# Patient Record
Sex: Male | Born: 1965
Health system: Southern US, Community
[De-identification: ages and names within clinical notes are randomized; demographics above are authoritative.]

## PROBLEM LIST (undated history)

## (undated) DIAGNOSIS — G8929 Other chronic pain: Secondary | ICD-10-CM

## (undated) DIAGNOSIS — T8859XA Other complications of anesthesia, initial encounter: Secondary | ICD-10-CM

## (undated) DIAGNOSIS — I249 Acute ischemic heart disease, unspecified: Secondary | ICD-10-CM

## (undated) DIAGNOSIS — M1990 Unspecified osteoarthritis, unspecified site: Secondary | ICD-10-CM

## (undated) DIAGNOSIS — I10 Essential (primary) hypertension: Secondary | ICD-10-CM

## (undated) DIAGNOSIS — T4145XA Adverse effect of unspecified anesthetic, initial encounter: Secondary | ICD-10-CM

## (undated) DIAGNOSIS — I214 Non-ST elevation (NSTEMI) myocardial infarction: Secondary | ICD-10-CM

## (undated) DIAGNOSIS — I2510 Atherosclerotic heart disease of native coronary artery without angina pectoris: Secondary | ICD-10-CM

## (undated) DIAGNOSIS — M1710 Unilateral primary osteoarthritis, unspecified knee: Secondary | ICD-10-CM

## (undated) DIAGNOSIS — M25511 Pain in right shoulder: Secondary | ICD-10-CM

## (undated) DIAGNOSIS — E039 Hypothyroidism, unspecified: Secondary | ICD-10-CM

## (undated) DIAGNOSIS — E782 Mixed hyperlipidemia: Secondary | ICD-10-CM

## (undated) DIAGNOSIS — R7303 Prediabetes: Secondary | ICD-10-CM

## (undated) DIAGNOSIS — M25521 Pain in right elbow: Secondary | ICD-10-CM

## (undated) HISTORY — DX: Prediabetes: R73.03

## (undated) HISTORY — DX: Hypothyroidism, unspecified: E03.9

## (undated) HISTORY — DX: Essential (primary) hypertension: I10

## (undated) HISTORY — PX: LAMINECTOMY: SHX219

## (undated) HISTORY — PX: LAPAROSCOPIC GASTRIC SLEEVE RESECTION: SHX5895

## (undated) HISTORY — PX: MOUTH SURGERY: SHX715

## (undated) HISTORY — DX: Other chronic pain: G89.29

## (undated) HISTORY — DX: Pain in right shoulder: M25.511

## (undated) HISTORY — DX: Unilateral primary osteoarthritis, unspecified knee: M17.10

## (undated) HISTORY — DX: Pain in right elbow: M25.521

## (undated) HISTORY — DX: Non-ST elevation (NSTEMI) myocardial infarction: I21.4

## (undated) HISTORY — DX: Acute ischemic heart disease, unspecified: I24.9

## (undated) HISTORY — DX: Mixed hyperlipidemia: E78.2

## (undated) HISTORY — PX: KNEE SURGERY: SHX244

## (undated) HISTORY — DX: Atherosclerotic heart disease of native coronary artery without angina pectoris: I25.10

---

## 2013-08-16 ENCOUNTER — Ambulatory Visit (INDEPENDENT_AMBULATORY_CARE_PROVIDER_SITE_OTHER): Payer: BC Managed Care – PPO | Admitting: Emergency Medicine

## 2013-08-16 VITALS — BP 132/80 | HR 65 | Temp 98.7°F | Resp 17 | Ht 73.0 in | Wt 339.0 lb

## 2013-08-16 DIAGNOSIS — L255 Unspecified contact dermatitis due to plants, except food: Secondary | ICD-10-CM

## 2013-08-16 MED ORDER — METHYLPREDNISOLONE ACETATE 80 MG/ML IJ SUSP
120.0000 mg | Freq: Once | INTRAMUSCULAR | Status: AC
  Administered 2013-08-16: 120 mg via INTRAMUSCULAR

## 2013-08-16 NOTE — Patient Instructions (Signed)

## 2013-08-16 NOTE — Progress Notes (Signed)
[facility] [address], [place] [id number] [phone]  Date:  08/16/2013   Name:  Brandon [name]   DOB:  Jan 10, 1966   MRN:  [id number]  PCP:  No PCP Per Patient    Chief Complaint: Rash, Dizziness and Headache   History of Present Illness:  Brandon [name] is a 48 y.o. very pleasant male patient who presents with the following:  Has a several day history of a rash behind left wear and it has not "spread" to forehead.  Is pruritic.  He believes he has shingles as he had varicella 10 years ago.  No pain. No fever or chills No cough or coryza.  No nausea or vomiting. No improvement with over the counter medications or other home remedies. Denies other complaint or health concern today.   There are no active problems to display for this patient.   Past Medical History  Diagnosis Date  . Hypertension     Past Surgical History  Procedure Laterality Date  . Laparoscopic gastric sleeve resection      History  Substance Use Topics  . Smoking status: Never Smoker   . Smokeless tobacco: Not on file  . Alcohol Use: No    Family History  Problem Relation Age of Onset  . Cancer Mother   . Heart disease Father   . Hypertension Father     No Known Allergies  Medication list has been reviewed and updated.  No current outpatient prescriptions on file prior to visit.   No current facility-administered medications on file prior to visit.    Review of Systems:  As per HPI, otherwise negative.    Physical Examination: Filed Vitals:   08/16/13 1003  BP: 132/80  Pulse: 65  Temp: 98.7 F (37.1 C)  Resp: 17   Filed Vitals:   08/16/13 1003  Height: 6\' 1"  (1.854 m)  Weight: 339 lb (153.769 kg)   Body mass index is 44.74 kg/(m^2). Ideal Body Weight: Weight in (lb) to have BMI = 25: 189.1  GEN: WDWN, NAD, Non-toxic, A & O x 3 HEENT: Atraumatic, Normocephalic. Neck supple. No masses, No LAD. Ears and Nose: No external deformity. CV: RRR, No  M/G/R. No JVD. No thrill. No extra heart sounds. PULM: CTA B, no wheezes, crackles, rhonchi. No retractions. No resp. distress. No accessory muscle use. ABD: S, NT, ND, +BS. No rebound. No HSM. EXTR: No c/c/e NEURO Normal gait.  PSYCH: Normally interactive. Conversant. Not depressed or anxious appearing.  Calm demeanor.  SKIN:  Erythematous rash characteristic of contact dermatitis behind left ear and midline forehead.  Assessment and Plan: Contact dermatitis Depo medrol Benadryl    Signed,  Phillips OdorJeffery Katesha Eichel, MD

## 2013-10-17 ENCOUNTER — Encounter (HOSPITAL_COMMUNITY): Payer: Self-pay | Admitting: Emergency Medicine

## 2013-10-17 ENCOUNTER — Emergency Department (HOSPITAL_COMMUNITY)
Admission: EM | Admit: 2013-10-17 | Discharge: 2013-10-17 | Disposition: A | Discharge status: Home / Self Care | Payer: BC Managed Care – PPO | Attending: Emergency Medicine | Admitting: Emergency Medicine

## 2013-10-17 DIAGNOSIS — Z791 Long term (current) use of non-steroidal anti-inflammatories (NSAID): Secondary | ICD-10-CM | POA: Insufficient documentation

## 2013-10-17 DIAGNOSIS — I10 Essential (primary) hypertension: Secondary | ICD-10-CM | POA: Diagnosis not present

## 2013-10-17 DIAGNOSIS — M1990 Unspecified osteoarthritis, unspecified site: Secondary | ICD-10-CM | POA: Insufficient documentation

## 2013-10-17 DIAGNOSIS — I83891 Varicose veins of right lower extremities with other complications: Secondary | ICD-10-CM | POA: Insufficient documentation

## 2013-10-17 DIAGNOSIS — M7989 Other specified soft tissue disorders: Secondary | ICD-10-CM | POA: Diagnosis present

## 2013-10-17 DIAGNOSIS — L729 Follicular cyst of the skin and subcutaneous tissue, unspecified: Secondary | ICD-10-CM | POA: Insufficient documentation

## 2013-10-17 HISTORY — DX: Unspecified osteoarthritis, unspecified site: M19.90

## 2013-10-17 NOTE — ED Notes (Signed)
Pt reports swelling in r/lower leg this am. Swelling noted in r/leg, anterior. Pt denies pain, but tender to pressure/touch. Tenderness does not radiate. Pt denies numbness or tingling in r/foot. . Raised area in 3cm x 4cm. No redness noted

## 2013-10-17 NOTE — Discharge Instructions (Signed)
Compression Stockings Compression stockings are elastic stockings that "compress" your legs. This helps to increase blood flow, decrease swelling, and reduces the chance of getting blood clots in your lower legs. Compression stockings are used:  After surgery.  If you have a history of poor circulation.  If you are prone to blood clots.  If you have varicose veins.  If you sit or are bedridden for long periods of time. WEARING COMPRESSION STOCKINGS  Your compression stockings should be worn as instructed by your caregiver.  Wearing the correct stocking size is important. Your caregiver can help measure and fit you to the correct size.  When wearing your stockings, do not allow the stockings to bunch up. This is especially important around your toes or behind your knees. Keep the stockings as smooth as possible.  Do not roll the stockings downward and leave them rolled down. This can form a restrictive band around your legs and can decrease blood flow.  The stockings should be removed once a day for 1 hour or as instructed by your caregiver. When the stockings are taken off, inspect your legs and feet. Look for:  Open sores.  Red spots.  Puffy areas (swelling).  Anything that does not seem normal. IMPORTANT INFORMATION ABOUT COMPRESSION STOCKINGS  The compression stockings should be clean, dry, and in good condition before you put them on.  Do not put lotion on your legs or feet. This makes it harder to put the stockings on.  Change your stockings immediately if they become wet or soiled.  Do not wear stockings that are ripped or torn.  You may hand-wash or put your stockings in the washing machine. Use cold or warm water with mild detergent. Do not bleach your stockings. They may be air-dried or dried in the dryer on low heat.  If you have pain or have a feeling of "pins and needles" in your feet or legs, you may be wearing stockings that are too tight. Call your caregiver  right away. SEEK IMMEDIATE MEDICAL CARE IF:   You have numbness or tingling in your lower legs that does not get better quickly after the stockings are removed.  Your toes or feet become cold and blue.  You develop open sores or have red spots on your legs that do not go away. MAKE SURE YOU:   Understand these instructions.  Will watch your condition.  Will get help right away if you are not doing well or get worse. Document Released: 10/24/2008 Document Revised: 03/21/2011 Document Reviewed: 10/24/2008 [facility] Patient Information 2015 [place] [place]. This information is not intended to replace advice given to you by your health care provider. Make sure you discuss any questions you have with your health care provider.  Epidermal Cyst An epidermal cyst is usually a small, painless lump under the skin. Cysts often occur on the face, neck, stomach, chest, or genitals. The cyst may be filled with a bad smelling paste. Do not pop your cyst. Popping the cyst can cause pain and puffiness (swelling). HOME CARE   Only take medicines as told by your doctor.  Take your medicine (antibiotics) as told. Finish it even if you start to feel better. GET HELP RIGHT AWAY IF:  Your cyst is tender, red, or puffy.  You are not getting better, or you are getting worse.  You have any questions or concerns. MAKE SURE YOU:  Understand these instructions.  Will watch your condition.  Will get help right away if you are  not doing well or get worse. Document Released: 02/04/2004 Document Revised: 06/28/2011 Document Reviewed: 07/05/2010 [facility] Patient Information 2015 [place], [place]. This information is not intended to replace advice given to you by your health care provider. Make sure you discuss any questions you have with your health care provider.

## 2013-10-17 NOTE — ED Provider Notes (Signed)
CSN: [id number]     Arrival date & time 10/17/13  1107 History  This chart was scribed for non-physician practitioner working with No att. providers found by Elveria Risingimelie Horne, ED Scribe. This patient was seen in room WTR5/WTR5 and the patient's care was started at 12:10 PM.   Chief Complaint  Patient presents with  . Leg Swelling    pt noted swollen area ro r/low leg this am   The history is provided by the patient. No language interpreter was used.   HPI Comments: Brandon [name] is a 48 y.o. male who presents to the Emergency Department complaining of lump with associated redness on his right leg that he noticed upon wakening this morning. Lump located on anterolateral aspect of right leg just above his ankle. Patient denies pain with activity, but reports pain with applied pressure. Patient shares that he spends much of his work day on his feet. He denies increased standing yesterday, direct injury or trauma. Patient reports history of tendonitis ten years ago, other issues associated with his right leg, and baseline pain. Patient denies history of DVTs/PEs. Patient denies use of compression socks.   Past Medical History  Diagnosis Date  . Hypertension   . Arthritis    Past Surgical History  Procedure Laterality Date  . Laparoscopic gastric sleeve resection    . Mouth surgery    . Knee surgery      l/knee   Family History  Problem Relation Age of Onset  . Cancer Mother   . Heart disease Father   . Hypertension Father    History  Substance Use Topics  . Smoking status: Never Smoker   . Smokeless tobacco: Not on file  . Alcohol Use: Yes     Comment: occ    Review of Systems  Constitutional: Negative for fever and chills.  Cardiovascular: Negative for leg swelling.  Musculoskeletal: Negative for joint swelling.  Skin:       Lump  Neurological: Negative for weakness and numbness.   Allergies  Review of patient's allergies indicates no known allergies.  Home Medications    Prior to Admission medications   Medication Sig Start Date End Date Taking? Authorizing Provider  meloxicam (MOBIC) 15 MG tablet Take 15 mg by mouth daily.    Historical Provider, MD   Triage Vitals: BP 130/80  Pulse 78  Temp(Src) 98.6 F (37 C) (Oral)  Resp 16  SpO2 97%  Physical Exam  Nursing note and vitals reviewed. Constitutional: He is oriented to person, place, and time. He appears well-developed and well-nourished. No distress.  HENT:  Head: Normocephalic and atraumatic.  Eyes: EOM are normal.  Neck: Neck supple.  Cardiovascular: Normal rate.   Pulses:      Dorsalis pedis pulses are 2+ on the right side.  Pulmonary/Chest: Effort normal. No respiratory distress.  Musculoskeletal: Normal range of motion. He exhibits no edema and no tenderness.  Full ROM of right knee and ankle. No deformity or edema.   Neurological: He is alert and oriented to person, place, and time.  Skin: Skin is warm and dry. No erythema.  Right lower leg anterior aspect. 3x4 cm area of mobile mass consistent with fluid filled cyst, no erythema or warmth, nontender. Right lower leg varicose veins present.  No evidence of underlying infection or abscess  Psychiatric: He has a normal mood and affect. His behavior is normal.    ED Course  Procedures (including critical care time)  COORDINATION OF CARE: 12:17 PM- Plans to  consult Dr. Anitra Lauth. Discussed treatment plan with patient at bedside and patient agreed to plan.   Labs Review Labs Reviewed - No data to display  Imaging Review No results found.   EKG Interpretation None      MDM   Final diagnoses:  Skin cyst  Varicose veins of leg with swelling, right   Pt presenting to ED with soft, mobile mass on right lower legs. No evidence of underlying infection or abscess. Varicose veins present. Right foot is neurovascularly in tact. discussed pt with Dr. Anitra Lauth, will tx with compression stockings. Encouraged pt to elevate feet  throughout the day. Advised to f/u with PCP for recheck and treatment of symptoms if not improving. Return precautions provided. Pt verbalized understanding and agreement with tx plan.  I personally performed the services described in this documentation, which was scribed in my presence. The recorded information has been reviewed and is accurate.    Junius Finner, PA-C 10/17/13 1332

## 2013-10-18 NOTE — ED Provider Notes (Signed)
Medical screening examination/treatment/procedure(s) were performed by non-physician practitioner and as supervising physician I was immediately available for consultation/collaboration.   EKG Interpretation None        Melchizedek Espinola, MD 10/18/13 1315 

## 2014-04-24 ENCOUNTER — Encounter (HOSPITAL_BASED_OUTPATIENT_CLINIC_OR_DEPARTMENT_OTHER): Admission: RE | Payer: Self-pay | Source: Ambulatory Visit

## 2014-04-24 ENCOUNTER — Ambulatory Visit (HOSPITAL_BASED_OUTPATIENT_CLINIC_OR_DEPARTMENT_OTHER): Admission: RE | Admit: 2014-04-24 | Payer: Self-pay | Source: Ambulatory Visit | Admitting: Orthopedic Surgery

## 2014-04-24 SURGERY — EXCISION HAGLUND'S DEFORMITY WITH ACHILLES TENDON REPAIR
Anesthesia: General | Laterality: Right

## 2014-05-19 ENCOUNTER — Encounter (HOSPITAL_BASED_OUTPATIENT_CLINIC_OR_DEPARTMENT_OTHER): Payer: Self-pay | Admitting: *Deleted

## 2014-05-19 NOTE — Progress Notes (Addendum)
Coming tomorrow for BMET, EKG and ANESTHESIA CONSULT due to BMI 46.20. Bring all medications and pack an overnight bag. [place], had gastric sleeve surgery lost 250 lbs but has gained back about 50 lbs. and does not use CPAP machine because he no longer has machine.  Discussed pt's history with DR. Crews - definitely wants pt  To come in for Anesthesia Consult.

## 2014-05-20 ENCOUNTER — Encounter (HOSPITAL_BASED_OUTPATIENT_CLINIC_OR_DEPARTMENT_OTHER)
Admission: RE | Admit: 2014-05-20 | Discharge: 2014-05-20 | Disposition: A | Discharge status: Home / Self Care | Payer: Self-pay | Source: Ambulatory Visit | Attending: Orthopedic Surgery | Admitting: Orthopedic Surgery

## 2014-05-20 ENCOUNTER — Other Ambulatory Visit: Payer: Self-pay

## 2014-05-20 DIAGNOSIS — Z79899 Other long term (current) drug therapy: Secondary | ICD-10-CM | POA: Diagnosis not present

## 2014-05-20 DIAGNOSIS — Z9884 Bariatric surgery status: Secondary | ICD-10-CM | POA: Diagnosis not present

## 2014-05-20 DIAGNOSIS — M170 Bilateral primary osteoarthritis of knee: Secondary | ICD-10-CM | POA: Diagnosis not present

## 2014-05-20 DIAGNOSIS — M9261 Juvenile osteochondrosis of tarsus, right ankle: Secondary | ICD-10-CM | POA: Diagnosis not present

## 2014-05-20 DIAGNOSIS — G4730 Sleep apnea, unspecified: Secondary | ICD-10-CM | POA: Diagnosis not present

## 2014-05-20 DIAGNOSIS — I10 Essential (primary) hypertension: Secondary | ICD-10-CM | POA: Diagnosis not present

## 2014-05-20 DIAGNOSIS — M7661 Achilles tendinitis, right leg: Secondary | ICD-10-CM | POA: Diagnosis not present

## 2014-05-20 DIAGNOSIS — Z87891 Personal history of nicotine dependence: Secondary | ICD-10-CM | POA: Diagnosis not present

## 2014-05-20 LAB — BASIC METABOLIC PANEL
Anion gap: 9 (ref 5–15)
BUN: 13 mg/dL (ref 6–20)
CO2: 25 mmol/L (ref 22–32)
Calcium: 9.6 mg/dL (ref 8.9–10.3)
Chloride: 104 mmol/L (ref 101–111)
Creatinine, Ser: 0.82 mg/dL (ref 0.61–1.24)
GFR calc Af Amer: >60 mL/min (ref >60)
GFR calc non Af Amer: >60 mL/min (ref >60)
Glucose, Bld: 101 mg/dL — ABNORMAL HIGH (ref 70–99)
POTASSIUM: 4.5 mmol/L (ref 3.5–5.1)
SODIUM: 138 mmol/L (ref 135–145)

## 2014-05-20 NOTE — Anesthesia Preprocedure Evaluation (Addendum)
Anesthesia Evaluation  Patient identified by MRN, date of birth, ID band Patient awake    Reviewed: Allergy & Precautions, NPO status , Patient's Chart, lab work & pertinent test results  Airway Mallampati: I  TM Distance: >3 FB Neck ROM: Full    Dental no notable dental hx.    Pulmonary neg pulmonary ROS, former smoker,  breath sounds clear to auscultation  Pulmonary exam normal       Cardiovascular hypertension, Normal cardiovascular examRhythm:Regular Rate:Normal     Neuro/Psych negative neurological ROS  negative psych ROS   GI/Hepatic negative GI ROS, Neg liver ROS,   Endo/Other  Morbid obesity  Renal/GU negative Renal ROS  negative genitourinary   Musculoskeletal negative musculoskeletal ROS (+)   Abdominal   Peds negative pediatric ROS (+)  Hematology negative hematology ROS (+)   Anesthesia Other Findings 05/20/14:  By hx of pt, he was retested for sleep apnea after his weigh loss surgery and it was determined he no longer needs CPAP.  MP 1 airway. He understands he may need to stay overnight should there be a problem with his breathing. Brandon [name]  Reproductive/Obstetrics negative OB ROS                            Anesthesia Physical Anesthesia Plan  ASA: III  Anesthesia Plan: General   Post-op Pain Management:    Induction: Intravenous  Airway Management Planned: Oral ETT  Additional Equipment:   Intra-op Plan:   Post-operative Plan: Extubation in OR  Informed Consent: I have reviewed the patients History and Physical, chart, labs and discussed the procedure including the risks, benefits and alternatives for the proposed anesthesia with the patient or authorized representative who has indicated his/her understanding and acceptance.   Dental advisory given  Plan Discussed with: CRNA and Surgeon  Anesthesia Plan Comments:         Anesthesia Quick Evaluation

## 2014-05-22 ENCOUNTER — Ambulatory Visit (HOSPITAL_BASED_OUTPATIENT_CLINIC_OR_DEPARTMENT_OTHER): Payer: BLUE CROSS/BLUE SHIELD | Admitting: Anesthesiology

## 2014-05-22 ENCOUNTER — Encounter (HOSPITAL_BASED_OUTPATIENT_CLINIC_OR_DEPARTMENT_OTHER): Payer: Self-pay | Admitting: *Deleted

## 2014-05-22 ENCOUNTER — Encounter (HOSPITAL_BASED_OUTPATIENT_CLINIC_OR_DEPARTMENT_OTHER)
Admission: RE | Disposition: A | Discharge status: Home / Self Care | Payer: Self-pay | Source: Ambulatory Visit | Attending: Orthopedic Surgery

## 2014-05-22 ENCOUNTER — Ambulatory Visit (HOSPITAL_BASED_OUTPATIENT_CLINIC_OR_DEPARTMENT_OTHER)
Admission: RE | Admit: 2014-05-22 | Discharge: 2014-05-22 | Disposition: A | Discharge status: Home / Self Care | Payer: BLUE CROSS/BLUE SHIELD | Source: Ambulatory Visit | Attending: Orthopedic Surgery | Admitting: Orthopedic Surgery

## 2014-05-22 DIAGNOSIS — Z79899 Other long term (current) drug therapy: Secondary | ICD-10-CM | POA: Insufficient documentation

## 2014-05-22 DIAGNOSIS — M9261 Juvenile osteochondrosis of tarsus, right ankle: Secondary | ICD-10-CM | POA: Insufficient documentation

## 2014-05-22 DIAGNOSIS — I10 Essential (primary) hypertension: Secondary | ICD-10-CM | POA: Insufficient documentation

## 2014-05-22 DIAGNOSIS — M170 Bilateral primary osteoarthritis of knee: Secondary | ICD-10-CM | POA: Insufficient documentation

## 2014-05-22 DIAGNOSIS — G4730 Sleep apnea, unspecified: Secondary | ICD-10-CM | POA: Insufficient documentation

## 2014-05-22 DIAGNOSIS — Z9884 Bariatric surgery status: Secondary | ICD-10-CM | POA: Insufficient documentation

## 2014-05-22 DIAGNOSIS — M7661 Achilles tendinitis, right leg: Secondary | ICD-10-CM | POA: Diagnosis not present

## 2014-05-22 DIAGNOSIS — Z87891 Personal history of nicotine dependence: Secondary | ICD-10-CM | POA: Insufficient documentation

## 2014-05-22 HISTORY — PX: EXCISION HAGLUND'S DEFORMITY WITH ACHILLES TENDON REPAIR: SHX5627

## 2014-05-22 HISTORY — PX: GASTROCNEMIUS RECESSION: SHX863

## 2014-05-22 LAB — POCT HEMOGLOBIN-HEMACUE: Hemoglobin: 16.4 g/dL (ref 13.0–17.0)

## 2014-05-22 SURGERY — EXCISION HAGLUND'S DEFORMITY WITH ACHILLES TENDON REPAIR
Anesthesia: Regional | Site: Leg Lower | Laterality: Right

## 2014-05-22 MED ORDER — FENTANYL CITRATE (PF) 100 MCG/2ML IJ SOLN
INTRAMUSCULAR | Status: AC
  Filled 2014-05-22: qty 8

## 2014-05-22 MED ORDER — SUCCINYLCHOLINE CHLORIDE 20 MG/ML IJ SOLN
INTRAMUSCULAR | Status: DC | PRN
  Administered 2014-05-22: 180 mg via INTRAVENOUS

## 2014-05-22 MED ORDER — FENTANYL CITRATE (PF) 100 MCG/2ML IJ SOLN
INTRAMUSCULAR | Status: AC
  Filled 2014-05-22: qty 2

## 2014-05-22 MED ORDER — CEFAZOLIN SODIUM 1-5 GM-% IV SOLN
INTRAVENOUS | Status: AC
  Filled 2014-05-22: qty 50

## 2014-05-22 MED ORDER — MIDAZOLAM HCL 2 MG/2ML IJ SOLN
1.0000 mg | INTRAMUSCULAR | Status: DC | PRN
Start: 2014-05-22 — End: 2014-05-22
  Administered 2014-05-22: 2 mg via INTRAVENOUS

## 2014-05-22 MED ORDER — GLYCOPYRROLATE 0.2 MG/ML IJ SOLN
0.2000 mg | Freq: Once | INTRAMUSCULAR | Status: DC | PRN
Start: 2014-05-22 — End: 2014-05-22

## 2014-05-22 MED ORDER — FENTANYL CITRATE (PF) 100 MCG/2ML IJ SOLN
INTRAMUSCULAR | Status: DC | PRN
  Administered 2014-05-22: 50 ug via INTRAVENOUS

## 2014-05-22 MED ORDER — MIDAZOLAM HCL 2 MG/2ML IJ SOLN
INTRAMUSCULAR | Status: AC
  Filled 2014-05-22: qty 2

## 2014-05-22 MED ORDER — FENTANYL CITRATE (PF) 100 MCG/2ML IJ SOLN: 25.0000 ug | INTRAMUSCULAR | Status: DC | PRN

## 2014-05-22 MED ORDER — KETOROLAC TROMETHAMINE 30 MG/ML IJ SOLN: 30.0000 mg | Freq: Once | INTRAMUSCULAR | Status: DC | PRN

## 2014-05-22 MED ORDER — PROPOFOL 10 MG/ML IV BOLUS
INTRAVENOUS | Status: DC | PRN
  Administered 2014-05-22: 300 mg via INTRAVENOUS

## 2014-05-22 MED ORDER — CHLORHEXIDINE GLUCONATE 4 % EX LIQD
60.0000 mL | Freq: Once | CUTANEOUS | Status: DC
Start: 2014-05-22 — End: 2014-05-22

## 2014-05-22 MED ORDER — LACTATED RINGERS IV SOLN
INTRAVENOUS | Status: DC
  Administered 2014-05-22 (×3): via INTRAVENOUS

## 2014-05-22 MED ORDER — CEFAZOLIN SODIUM-DEXTROSE 2-3 GM-% IV SOLR
INTRAVENOUS | Status: AC
  Filled 2014-05-22: qty 50

## 2014-05-22 MED ORDER — PROPOFOL 10 MG/ML IV BOLUS
INTRAVENOUS | Status: AC
  Filled 2014-05-22: qty 20

## 2014-05-22 MED ORDER — SUCCINYLCHOLINE CHLORIDE 20 MG/ML IJ SOLN
INTRAMUSCULAR | Status: DC | PRN
Start: 2014-05-22 — End: 2014-05-22

## 2014-05-22 MED ORDER — ONDANSETRON HCL 4 MG/2ML IJ SOLN
INTRAMUSCULAR | Status: DC | PRN
  Administered 2014-05-22: 4 mg via INTRAVENOUS

## 2014-05-22 MED ORDER — SODIUM CHLORIDE 0.9 % IV SOLN: INTRAVENOUS | Status: DC

## 2014-05-22 MED ORDER — FENTANYL CITRATE (PF) 100 MCG/2ML IJ SOLN
50.0000 ug | INTRAMUSCULAR | Status: DC | PRN
  Administered 2014-05-22: 100 ug via INTRAVENOUS

## 2014-05-22 MED ORDER — MIDAZOLAM HCL 5 MG/5ML IJ SOLN
INTRAMUSCULAR | Status: DC | PRN
  Administered 2014-05-22: 2 mg via INTRAVENOUS

## 2014-05-22 MED ORDER — EPHEDRINE SULFATE 50 MG/ML IJ SOLN
INTRAMUSCULAR | Status: DC | PRN
  Administered 2014-05-22: 10 mg via INTRAVENOUS
  Administered 2014-05-22 (×2): 5 mg via INTRAVENOUS

## 2014-05-22 MED ORDER — PROMETHAZINE HCL 25 MG/ML IJ SOLN: 6.2500 mg | INTRAMUSCULAR | Status: DC | PRN

## 2014-05-22 MED ORDER — DEXAMETHASONE SODIUM PHOSPHATE 4 MG/ML IJ SOLN
INTRAMUSCULAR | Status: DC | PRN
  Administered 2014-05-22: 10 mg via INTRAVENOUS

## 2014-05-22 MED ORDER — OXYCODONE HCL 5 MG PO TABS: 5.0000 mg | ORAL_TABLET | ORAL | Status: DC | PRN

## 2014-05-22 MED ORDER — DEXTROSE 5 % IV SOLN
3.0000 g | INTRAVENOUS | Status: AC
  Administered 2014-05-22: 3 g via INTRAVENOUS

## 2014-05-22 SURGICAL SUPPLY — 79 items
BANDAGE ESMARK 6X9 LF (GAUZE/BANDAGES/DRESSINGS) ×1 IMPLANT
BLADE AVERAGE 25MMX9MM (BLADE) ×1
BLADE AVERAGE 25X9 (BLADE) ×2 IMPLANT
BLADE MICRO SAGITTAL (BLADE) IMPLANT
BLADE SURG 15 STRL LF DISP TIS (BLADE) ×2 IMPLANT
BLADE SURG 15 STRL SS (BLADE) ×4
BNDG COHESIVE 4X5 TAN STRL (GAUZE/BANDAGES/DRESSINGS) ×3 IMPLANT
BNDG COHESIVE 6X5 TAN STRL LF (GAUZE/BANDAGES/DRESSINGS) ×3 IMPLANT
BNDG ESMARK 6X9 LF (GAUZE/BANDAGES/DRESSINGS) ×3
BOOT STEPPER DURA LG (SOFTGOODS) IMPLANT
BOOT STEPPER DURA MED (SOFTGOODS) IMPLANT
CANISTER SUCT 1200ML W/VALVE (MISCELLANEOUS) IMPLANT
CHLORAPREP W/TINT 26ML (MISCELLANEOUS) ×3 IMPLANT
COVER BACK TABLE 60X90IN (DRAPES) ×3 IMPLANT
CUFF TOURNIQUET SINGLE 34IN LL (TOURNIQUET CUFF) ×3 IMPLANT
CUFF TOURNIQUET SINGLE 44IN (TOURNIQUET CUFF) ×3 IMPLANT
DRAPE EXTREMITY T 121X128X90 (DRAPE) ×3 IMPLANT
DRAPE OEC MINIVIEW 54X84 (DRAPES) ×3 IMPLANT
DRAPE U-SHAPE 47X51 STRL (DRAPES) ×3 IMPLANT
DRSG EMULSION OIL 3X3 NADH (GAUZE/BANDAGES/DRESSINGS) IMPLANT
DRSG MEPITEL 4X7.2 (GAUZE/BANDAGES/DRESSINGS) ×3 IMPLANT
DRSG PAD ABDOMINAL 8X10 ST (GAUZE/BANDAGES/DRESSINGS) ×6 IMPLANT
ELECT REM PT RETURN 9FT ADLT (ELECTROSURGICAL) ×3
ELECTRODE REM PT RTRN 9FT ADLT (ELECTROSURGICAL) ×1 IMPLANT
GAUZE SPONGE 4X4 12PLY STRL (GAUZE/BANDAGES/DRESSINGS) ×3 IMPLANT
GLOVE BIO SURGEON STRL SZ8 (GLOVE) ×3 IMPLANT
GLOVE BIOGEL PI IND STRL 8 (GLOVE) ×1 IMPLANT
GLOVE BIOGEL PI INDICATOR 8 (GLOVE) ×2
GLOVE EXAM NITRILE MD LF STRL (GLOVE) IMPLANT
GOWN STRL REUS W/ TWL LRG LVL3 (GOWN DISPOSABLE) ×1 IMPLANT
GOWN STRL REUS W/ TWL XL LVL3 (GOWN DISPOSABLE) ×1 IMPLANT
GOWN STRL REUS W/TWL LRG LVL3 (GOWN DISPOSABLE) ×2
GOWN STRL REUS W/TWL XL LVL3 (GOWN DISPOSABLE) ×2
KIT BIO-TENODESIS 3X8 DISP (MISCELLANEOUS)
KIT INSRT BABSR STRL DISP BTN (MISCELLANEOUS) IMPLANT
NDL SAFETY ECLIPSE 18X1.5 (NEEDLE) IMPLANT
NEEDLE HYPO 18GX1.5 SHARP (NEEDLE)
NEEDLE HYPO 22GX1.5 SAFETY (NEEDLE) IMPLANT
NEEDLE HYPO 25X1 1.5 SAFETY (NEEDLE) IMPLANT
NS IRRIG 1000ML POUR BTL (IV SOLUTION) ×3 IMPLANT
PACK ACHILLES SUTUREBRIDGE (Anchor) ×3 IMPLANT
PACK BASIN DAY SURGERY FS (CUSTOM PROCEDURE TRAY) ×3 IMPLANT
PAD CAST 4YDX4 CTTN HI CHSV (CAST SUPPLIES) ×1 IMPLANT
PADDING CAST ABS 4INX4YD NS (CAST SUPPLIES) ×2
PADDING CAST ABS COTTON 4X4 ST (CAST SUPPLIES) ×1 IMPLANT
PADDING CAST COTTON 4X4 STRL (CAST SUPPLIES) ×2
PADDING CAST COTTON 6X4 STRL (CAST SUPPLIES) ×3 IMPLANT
PENCIL BUTTON HOLSTER BLD 10FT (ELECTRODE) ×3 IMPLANT
SANITIZER HAND PURELL 535ML FO (MISCELLANEOUS) ×3 IMPLANT
SHEET MEDIUM DRAPE 40X70 STRL (DRAPES) ×3 IMPLANT
SLEEVE SCD COMPRESS KNEE MED (MISCELLANEOUS) ×3 IMPLANT
SPLINT FAST PLASTER 5X30 (CAST SUPPLIES) ×40
SPLINT PLASTER CAST FAST 5X30 (CAST SUPPLIES) ×20 IMPLANT
SPONGE LAP 18X18 X RAY DECT (DISPOSABLE) ×3 IMPLANT
STAPLER VISISTAT 35W (STAPLE) IMPLANT
STOCKINETTE 6  STRL (DRAPES) ×2
STOCKINETTE 6 STRL (DRAPES) ×1 IMPLANT
SUCTION FRAZIER TIP 10 FR DISP (SUCTIONS) IMPLANT
SUT ETHIBOND 2 OS 4 DA (SUTURE) IMPLANT
SUT ETHIBOND 3-0 V-5 (SUTURE) IMPLANT
SUT ETHILON 3 0 PS 1 (SUTURE) ×3 IMPLANT
SUT FIBERWIRE #2 38 T-5 BLUE (SUTURE)
SUT MNCRL AB 3-0 PS2 18 (SUTURE) ×3 IMPLANT
SUT MNCRL AB 4-0 PS2 18 (SUTURE) IMPLANT
SUT VIC AB 0 CT1 27 (SUTURE)
SUT VIC AB 0 CT1 27XBRD ANBCTR (SUTURE) IMPLANT
SUT VIC AB 0 SH 27 (SUTURE) IMPLANT
SUT VIC AB 2-0 SH 18 (SUTURE) IMPLANT
SUT VIC AB 2-0 SH 27 (SUTURE)
SUT VIC AB 2-0 SH 27XBRD (SUTURE) IMPLANT
SUT VICRYL 4-0 PS2 18IN ABS (SUTURE) IMPLANT
SUTURE FIBERWR #2 38 T-5 BLUE (SUTURE) IMPLANT
SYR BULB 3OZ (MISCELLANEOUS) ×3 IMPLANT
SYR CONTROL 10ML LL (SYRINGE) IMPLANT
TOWEL OR 17X24 6PK STRL BLUE (TOWEL DISPOSABLE) ×3 IMPLANT
TUBE CONNECTING 20'X1/4 (TUBING)
TUBE CONNECTING 20X1/4 (TUBING) IMPLANT
UNDERPAD 30X30 (UNDERPADS AND DIAPERS) ×3 IMPLANT
YANKAUER SUCT BULB TIP NO VENT (SUCTIONS) IMPLANT

## 2014-05-22 NOTE — Progress Notes (Signed)
Assisted Dr. Rose with right, ultrasound guided, popliteal block. Side rails up, monitors on throughout procedure. See vital signs in flow sheet. Tolerated Procedure well. °

## 2014-05-22 NOTE — Anesthesia Procedure Notes (Addendum)
Procedure Name: Intubation Date/Time: 05/22/2014 2:09 PM Performed by: Delta DesanctisLINKA, MARGARET L Pre-anesthesia Checklist: Patient identified, Emergency Drugs available, Suction available, Patient being monitored and Timeout performed Patient Re-evaluated:Patient Re-evaluated prior to inductionOxygen Delivery Method: Circle System Utilized Preoxygenation: Pre-oxygenation with 100% oxygen Intubation Type: IV induction Ventilation: Mask ventilation without difficulty Laryngoscope Size: Miller and 3 Grade View: Grade III Tube type: Oral Tube size: 8.0 mm Number of attempts: 1 Airway Equipment and Method: Stylet and Oral airway Placement Confirmation: ETT inserted through vocal cords under direct vision,  positive ETCO2 and breath sounds checked- equal and bilateral Secured at: 22 cm Tube secured with: Tape Dental Injury: Teeth and Oropharynx as per pre-operative assessment    Anesthesia Regional Block:  Popliteal block  Pre-Anesthetic Checklist: ,, timeout performed, Correct Patient, Correct Site, Correct Laterality, Correct Procedure, Correct Position, site marked, Risks and benefits discussed,  Surgical consent,  Pre-op evaluation,  At surgeon's request and post-op pain management  Laterality: Right  Prep: chloraprep       Needles:  Injection technique: Single-shot  Needle Type: Echogenic Stimulator Needle     Needle Length: 9cm 9 cm Needle Gauge: 21 and 21 G    Additional Needles:  Procedures: ultrasound guided (picture in chart) Popliteal block Narrative:  Injection made incrementally with aspirations every 5 mL.  Performed by: Personally   Additional Notes: Patient tolerated the procedure well without complications

## 2014-05-22 NOTE — Discharge Instructions (Addendum)
John Hewitt, MD °[facility] ° °Please read the following information regarding your care after surgery. ° °Medications  °You only need a prescription for the narcotic pain medicine (ex. oxycodone, Percocet, Norco).  All of the other medicines listed below are available over the counter. °X acetominophen (Tylenol) 650 mg every 4-6 hours as you need for minor pain °X oxycodone as prescribed for moderate to severe pain °?  ° °Narcotic pain medicine (ex. oxycodone, Percocet, Vicodin) will cause constipation.  To prevent this problem, take the following medicines while you are taking any pain medicine. °X docusate sodium (Colace) 100 mg twice a day X senna (Senokot) 2 tablets twice a day ° °X To help prevent blood clots, take an aspirin (325 mg) once a day for a month after surgery.  You should also get up every hour while you are awake to move around.   ° °Weight Bearing °? Bear weight when you are able on your operated leg or foot. °? Bear weight only on the heel of your operated foot in the post-op shoe. °X Do not bear any weight on the operated leg or foot. ° °Cast / Splint / Dressing °X Keep your splint or cast clean and dry.  Don’t put anything (coat hanger, pencil, etc) down inside of it.  If it gets damp, use a hair dryer on the cool setting to dry it.  If it gets soaked, call the office to schedule an appointment for a cast change. °? Remove your dressing 3 days after surgery and cover the incisions with dry dressings.   ° °After your dressing, cast or splint is removed; you may shower, but do not soak or scrub the wound.  Allow the water to run over it, and then gently pat it dry. ° °Swelling °It is normal for you to have swelling where you had surgery.  To reduce swelling and pain, keep your toes above your nose for at least 3 days after surgery.  It may be necessary to keep your foot or leg elevated for several weeks.  If it hurts, it should be elevated. ° °Follow Up °Call my office at  [phone] when you are discharged from the hospital or surgery center to schedule an appointment to be seen two weeks after surgery. ° °Call my office at [phone] if you develop a fever >101.5° F, nausea, vomiting, bleeding from the surgical site or severe pain.   ° ° °Post Anesthesia Home Care Instructions ° °Activity: °Get plenty of rest for the remainder of the day. A responsible adult should stay with you for 24 hours following the procedure.  °For the next 24 hours, DO NOT: °-Drive a car °-Operate machinery °-Drink alcoholic beverages °-Take any medication unless instructed by your physician °-Make any legal decisions or sign important papers. ° °Meals: °Start with liquid foods such as gelatin or soup. Progress to regular foods as tolerated. Avoid greasy, spicy, heavy foods. If nausea and/or vomiting occur, drink only clear liquids until the nausea and/or vomiting subsides. Call your physician if vomiting continues. ° °Special Instructions/Symptoms: °Your throat may feel dry or sore from the anesthesia or the breathing tube placed in your throat during surgery. If this causes discomfort, gargle with warm salt water. The discomfort should disappear within 24 hours. ° °If you had a scopolamine patch placed behind your ear for the management of post- operative nausea and/or vomiting: ° °1. The medication in the patch is effective for 72 hours, after which it should be removed.    Wrap patch in a tissue and discard in the trash. Wash hands thoroughly with soap and water. °2. You may remove the patch earlier than 72 hours if you experience unpleasant side effects which may include dry mouth, dizziness or visual disturbances. °3. Avoid touching the patch. Wash your hands with soap and water after contact with the patch. °  ° °Regional Anesthesia Blocks ° °1. Numbness or the inability to move the "blocked" extremity may last from 3-48 hours after placement. The length of time depends on the medication injected  and your individual response to the medication. If the numbness is not going away after 48 hours, call your surgeon. ° °2. The extremity that is blocked will need to be protected until the numbness is gone and the  Strength has returned. Because you cannot feel it, you will need to take extra care to avoid injury. Because it may be weak, you may have difficulty moving it or using it. You may not know what position it is in without looking at it while the block is in effect. ° °3. For blocks in the legs and feet, returning to weight bearing and walking needs to be done carefully. You will need to wait until the numbness is entirely gone and the strength has returned. You should be able to move your leg and foot normally before you try and bear weight or walk. You will need someone to be with you when you first try to ensure you do not fall and possibly risk injury. ° °4. Bruising and tenderness at the needle site are common side effects and will resolve in a few days. ° °5. Persistent numbness or new problems with movement should be communicated to the surgeon or the [facility] ([phone])/ [facility] ([phone]). ° °

## 2014-05-22 NOTE — Brief Op Note (Signed)
05/22/2014  3:24 PM  PATIENT:  Brandon [name]  49 y.o. male  PRE-OPERATIVE DIAGNOSIS:  RIGHT ACHILLES TENDONITIS, Haglund deformity and tight heelcord  POST-OPERATIVE DIAGNOSIS:  same  Procedure(s): 1.  RIGHT ACHILLES DEBRIDEMENT and reconstruction;  2.  Excision of HAGLUND deformity 3.  Right gastrocnemius recession (separate incision)   SURGEON:  Toni ArthursJohn Alphonzo Devera, MD  ASSISTANT: n/a  ANESTHESIA:   General, regional  EBL:  minimal   TOURNIQUET:  < 1 hour at 350 mm Hg  COMPLICATIONS:  None apparent  DISPOSITION:  Extubated, awake and stable to recovery.  DICTATION ID:  [id number]

## 2014-05-22 NOTE — H&P (Signed)
Brandon [name] is an 49 y.o. male.   Chief Complaint:  Right heel pain HPI: 49 y/o male with right achilles insertional tendonitis, tight heelcord and Haglund deformity.  He has failed non op treatment and presents today for surgery.  He denies any changes in his health since I saw him last.  Past Medical History  Diagnosis Date  . Hypertension   . Arthritis     both knees    Past Surgical History  Procedure Laterality Date  . Laparoscopic gastric sleeve resection    . Mouth surgery      Root canal  . Knee surgery      l/knee    Family History  Problem Relation Age of Onset  . Cancer Mother   . Heart disease Father   . Hypertension Father    Social History:  reports that he has quit smoking. He does not have any smokeless tobacco history on file. He reports that he drinks alcohol. He reports that he does not use illicit drugs.  Allergies: Not on File  Medications Prior to Admission  Medication Sig Dispense Refill  . atorvastatin (LIPITOR) 20 MG tablet Take 20 mg by mouth daily.    Marland Kitchen. LISINOPRIL PO Take 20 mg by mouth daily.     . meloxicam (MOBIC) 15 MG tablet Take 15 mg by mouth daily.      Results for orders placed or performed during the hospital encounter of 05/22/14 (from the past 48 hour(s))  Hemoglobin-hemacue, POC     Status: None   Collection Time: 05/22/14 12:15 PM  Result Value Ref Range   Hemoglobin 16.4 13.0 - 17.0 g/dL   No results found.  ROS  No recent f/c/nv/wt loss  Blood pressure 132/67, pulse 66, temperature 98 F (36.7 C), temperature source Oral, resp. rate 15, height 6' 2.02" (1.88 m), weight 165.563 kg (365 lb), SpO2 100 %. Physical Exam  wn wd male in nad.  A and o x 4.  Mood and affect normal.  EOMI.  resp unalbored.  R heel TTP at insertion of achilles.  No lymphadenopathy.  5/5 strength inPF.  Heel cord is tight.  2+ dp and pt pulses.  Feels LT normally throghout the foot.  Assessment/Plan R achilles insertional tendonitis, Haglund  deformity and tight heelcord.  To OR for gastroc recession, achilles debridement and repair and excision of Haglund deformity.  The risks and benefits of the alternative treatment options have been discussed in detail.  The patient wishes to proceed with surgery and specifically understands risks of bleeding, infection, nerve damage, blood clots, need for additional surgery, amputation and death.   Toni ArthursHEWITT, Asta Corbridge 05/22/2014, 1:43 PM

## 2014-05-22 NOTE — Transfer of Care (Signed)
Immediate Anesthesia Transfer of Care Note  Patient: Brandon PanderLyle Prioleau  Procedure(s) Performed: Procedure(s): RIGHT ACHILLES DEBRIDEMENT; HAGLUND'S EXCISION  (Right) GASTROC RECESSION  (Right)  Patient Location: PACU  Anesthesia Type:GA combined with regional for post-op pain  Level of Consciousness: awake, alert  and patient cooperative  Airway & Oxygen Therapy: Patient Spontanous Breathing and Patient connected to face mask oxygen  Post-op Assessment: Report given to RN, Post -op Vital signs reviewed and stable and Patient moving all extremities  Post vital signs: Reviewed and stable  Last Vitals:  Filed Vitals:   05/22/14 1334  BP:   Pulse: 66  Temp:   Resp: 15    Complications: No apparent anesthesia complications

## 2014-05-22 NOTE — Anesthesia Postprocedure Evaluation (Signed)
  Anesthesia Post-op Note  Patient: Brandon [name]  Procedure(s) Performed: Procedure(s) (LRB): RIGHT ACHILLES DEBRIDEMENT; HAGLUND'S EXCISION  (Right) GASTROC RECESSION  (Right)  Patient Location: PACU  Anesthesia Type: GA combined with regional for post-op pain  Level of Consciousness: awake and alert   Airway and Oxygen Therapy: Patient Spontanous Breathing  Post-op Pain: mild  Post-op Assessment: Post-op Vital signs reviewed, Patient's Cardiovascular Status Stable, Respiratory Function Stable, Patent Airway and No signs of Nausea or vomiting  Last Vitals:  Filed Vitals:   05/22/14 1625  BP: 122/70  Pulse: 70  Temp: 36.9 C  Resp: 18    Post-op Vital Signs: stable   Complications: No apparent anesthesia complications

## 2014-05-23 ENCOUNTER — Encounter (HOSPITAL_BASED_OUTPATIENT_CLINIC_OR_DEPARTMENT_OTHER): Payer: Self-pay | Admitting: Orthopedic Surgery

## 2014-05-23 NOTE — Op Note (Signed)
NAMJudithann [name]:  Brandon [name], Brandon [name]              ACCOUNT NO.:  [id number]  MEDICAL RECORD NO.:  [id number]  LOCATION:                                FACILITY:  [facility]  PHYSICIAN:  Brandon ArthursJohn Ronalee Scheunemann, MD        DATE OF BIRTH:  [birth year]  DATE OF PROCEDURE:  05/22/2014 DATE OF DISCHARGE:  05/22/2014                              OPERATIVE REPORT   PREOPERATIVE DIAGNOSES: 1. Right Achilles insertional tendinopathy. 2. Right calcaneus Haglund deformity. 3. Tight right heel cord.  POSTOPERATIVE DIAGNOSES: 1. Right Achilles insertional tendinopathy. 2. Right calcaneus Haglund deformity. 3. Tight right heel cord.  PROCEDURE: 1. Right Achilles tendon debridement and reconstruction. 2. Excision of right calcaneus Haglund deformity. 3. Right gastrocnemius recession through a separate incision.  SURGEON:  Brandon ArthursJohn Serjio Deupree, MD  ANESTHESIA:  General, regional.  ESTIMATED BLOOD LOSS:  Minimal.  TOURNIQUET TIME:  Less than 1 hour at 350 mmHg.  COMPLICATIONS:  None apparent.  DISPOSITION:  Extubated, awake, and stable to recovery.  INDICATIONS FOR PROCEDURE:  The patient is a 49 year old male with a history of right insertional Achilles tendinopathy that has been bothering him for over a year.  He has failed nonoperative treatment to date including activity modification, oral anti-inflammatories, heel lift, and extensive physical therapy.  He presents now for operative treatment of this painful condition.  He understands the risks and benefits, the alternative treatment options, and elects surgical treatment.  He specifically understands risks of bleeding, infection, nerve damage, blood clots, need for additional surgery, continued pain, amputation, and death.  PROCEDURE IN DETAIL:  After preoperative consent was obtained and the correct operative site was identified, the patient was brought to the operating room, supine on a stretcher.  General anesthesia was induced. Preoperative antibiotics were  administered.  A surgical time-out was taken.  The right lower extremity was exsanguinated and the tourniquet was inflated to 350 mmHg.  The patient was then turned into the prone position on the operating table with all bony prominences padded well. The right lower extremity was then prepped and draped in standard sterile fashion.  A longitudinal incision was made over the central portion of the calf at the gastrocnemius tendon.  Sharp dissection was carried down through skin and subcutaneous tissue.  Care was taken to protect the sural nerve and lesser saphenous vein.  Superficial fascia was incised.  The gastrocnemius tendon was identified.  It was divided in its entirety.  The wound was irrigated and the subcutaneous tissues were approximated with Monocryl.  The skin incision was closed with nylon.  Attention was then turned to the posterior aspect of the heel where longitudinal incision was made over the Achilles and extended down on to the posterior aspect of the heel.  Sharp dissection was carried down through the skin and subcutaneous tissue and paratenon.  The paratenon was elevated medially and laterally.  The Achilles tendon was then split longitudinally and released from its insertion on the calcaneus medially and laterally.  The patient's insertional enthesophyte was identified. The oscillating saw was used to resect the enthesophyte and the Haglund deformity.  Cut surface of bone was then smoothed with a rasp.  The tendon was  then debrided sharply with a #15 blade of all degenerated portion of the tendon fibers.  The remaining portion of the tendon was greater than 50% of the cross-sectional area.  The tendon was then repaired back to the cut surface of bone using the Arthrex suture bridge construct of 4-suture anchors in an hourglass pattern of suture.  The longitudinal split in the tendon was repaired with 2-0 Vicryl horizontal mattress sutures.  Wound was irrigated  copiously.  The paratenon and subcutaneous tissues were approximated over the tendon with inverted simple sutures of 3-0 Monocryl.  The skin incision was closed with horizontal mattress sutures of 3-0 nylon.  Sterile dressings were applied followed by well-padded short-leg splint.  Tourniquet was released at less than 1 hour after application of the dressings.  The patient was awakened from anesthesia and transported to the recovery room in stable condition.  FOLLOWUP PLAN:  The patient will be nonweightbearing on the right lower extremity.  He will follow up with me in 2 weeks for suture removal and conversion to a CAM walker boot with a heel lift.  In the meantime, he will take aspirin for DVT prophylaxis.     Brandon ArthursJohn Jermery Caratachea, MD     JH/MEDQ  D:  05/22/2014  T:  05/23/2014  Job:  [id number]

## 2014-06-05 NOTE — Addendum Note (Signed)
Addendum  created 06/05/14 1157 by Eilene GhaziGeorge Rudolpho Claxton, MD   Modules edited: Anesthesia Blocks and Procedures, Clinical Notes   Clinical Notes:  File: [id number]

## 2014-10-03 ENCOUNTER — Ambulatory Visit: Payer: BLUE CROSS/BLUE SHIELD | Admitting: Neurology

## 2015-02-23 ENCOUNTER — Ambulatory Visit (INDEPENDENT_AMBULATORY_CARE_PROVIDER_SITE_OTHER): Payer: BLUE CROSS/BLUE SHIELD | Admitting: Family Medicine

## 2015-02-23 VITALS — BP 132/87 | HR 76 | Temp 98.2°F | Resp 18 | Ht 73.62 in | Wt 389.4 lb

## 2015-02-23 DIAGNOSIS — R5381 Other malaise: Secondary | ICD-10-CM | POA: Diagnosis not present

## 2015-02-23 DIAGNOSIS — R5383 Other fatigue: Secondary | ICD-10-CM

## 2015-02-23 LAB — POCT CBC
Granulocyte percent: 61.3 %G (ref 37–80)
HCT, POC: 47.3 % (ref 43.5–53.7)
Hemoglobin: 16.2 g/dL (ref 14.1–18.1)
Lymph, poc: 3.7 — AB (ref 0.6–3.4)
MCH, POC: 31.6 pg — AB (ref 27–31.2)
MCHC: 34.2 g/dL (ref 31.8–35.4)
MCV: 92.4 fL (ref 80–97)
MID (cbc): 0.3 (ref 0–0.9)
MPV: 8.2 fL (ref 0–99.8)
POC Granulocyte: 6.3 (ref 2–6.9)
POC LYMPH PERCENT: 35.7 %L (ref 10–50)
POC MID %: 3 %M (ref 0–12)
Platelet Count, POC: 145 10*3/uL (ref 142–424)
RBC: 5.11 M/uL (ref 4.69–6.13)
RDW, POC: 15.5 %
WBC: 10.3 10*3/uL — AB (ref 4.6–10.2)

## 2015-02-23 LAB — COMPLETE METABOLIC PANEL WITH GFR
ALT: 44 U/L (ref 9–46)
AST: 29 U/L (ref 10–40)
Albumin: 4.7 g/dL (ref 3.6–5.1)
Alkaline Phosphatase: 52 U/L (ref 40–115)
BUN: 14 mg/dL (ref 7–25)
CO2: 29 mmol/L (ref 20–31)
Calcium: 9.9 mg/dL (ref 8.6–10.3)
Chloride: 103 mmol/L (ref 98–110)
Creat: 0.98 mg/dL (ref 0.60–1.35)
GFR, Est African American: >89 mL/min (ref >=60)
GFR, Est Non African American: >89 mL/min (ref >=60)
Glucose, Bld: 108 mg/dL — ABNORMAL HIGH (ref 65–99)
Potassium: 4.9 mmol/L (ref 3.5–5.3)
Sodium: 141 mmol/L (ref 135–146)
Total Bilirubin: 0.5 mg/dL (ref 0.2–1.2)
Total Protein: 7.4 g/dL (ref 6.1–8.1)

## 2015-02-23 NOTE — Progress Notes (Signed)
   Subjective:    Patient ID: Brandon [name], male    DOB: Sep 11, 1965, 50 y.o.   MRN: [id number] By signing my name below, I, Littie Deeds, attest that this documentation has been prepared under the direction and in the presence of Elvina Sidle, MD.  Electronically Signed: Littie Deeds, Medical Scribe. 02/23/2015. 10:49 AM.  HPI HPI Comments: Telvin Reinders is a 50 y.o. male with a history of hypertension who presents to the [facility] complaining of generalized fatigue over the past 3-4 days. Patient did have some diarrhea yesterday. He has felt like his blood pressure has been elevated. Patient denies urinary symptoms, feet swelling, cough, and congestion. He did not receive the flu shot this year. Patient currently takes meloxicam for bilateral knee pain.   Patient works for an [employer].  Review of Systems  Constitutional: Positive for fatigue.  HENT: Negative for congestion.   Respiratory: Negative for cough.   Cardiovascular: Negative for leg swelling.  Gastrointestinal: Positive for diarrhea.  Genitourinary: Negative.   Musculoskeletal: Positive for arthralgias.       Objective:   Physical Exam CONSTITUTIONAL: Well developed. Morbidly obese. HEAD: Normocephalic/atraumatic EYES: EOM/PERRL ENMT: Mucous membranes moist NECK: supple no meningeal signs SPINE: entire spine nontender CV: Regular rate and rhythm. S1/S2 noted, no murmurs/rubs/gallops noted. Repeat blood pressure: 140/80. LUNGS: Lungs are clear to auscultation bilaterally, no apparent distress ABDOMEN: soft, nontender, no rebound or guarding. No HSM. GU: no cva tenderness NEURO: Pt is awake/alert, moves all extremitiesx4 EXTREMITIES: pulses normal, full ROM SKIN: warm, color normal PSYCH: no abnormalities of mood noted     Results for orders placed or performed in visit on 02/23/15  POCT CBC  Result Value Ref Range   WBC 10.3 (A) 4.6 - 10.2 K/uL   Lymph, poc 3.7 (A) 0.6 - 3.4   POC LYMPH PERCENT 35.7 10 - 50 %L   MID (cbc) 0.3 0 - 0.9   POC MID % 3.0 0 - 12 %M   POC Granulocyte 6.3 2 - 6.9   Granulocyte percent 61.3 37 - 80 %G   RBC 5.11 4.69 - 6.13 M/uL   Hemoglobin 16.2 14.1 - 18.1 g/dL   HCT, POC 08.6 57.8 - 53.7 %   MCV 92.4 80 - 97 fL   MCH, POC 31.6 (A) 27 - 31.2 pg   MCHC 34.2 31.8 - 35.4 g/dL   RDW, POC 46.9 %   Platelet Count, POC 145 142 - 424 K/uL   MPV 8.2 0 - 99.8 fL    Assessment & Plan:   This chart was scribed in my presence and reviewed by me personally.    ICD-9-CM ICD-10-CM   1. Malaise and fatigue 780.79 R53.81 POCT CBC    R53.83 COMPLETE METABOLIC PANEL WITH GFR     TSH     Signed, Elvina Sidle, MD

## 2015-02-23 NOTE — Patient Instructions (Signed)
The blood count suggests that you have a viral infection. This may take up to a week or 10 days to resolve. In the meantime, symptomatic strategies include taking Tylenol or ibuprofen or alternating them. Also keeping your fluids going to avoid dehydration is important.

## 2015-02-24 ENCOUNTER — Other Ambulatory Visit: Payer: Self-pay | Admitting: Family Medicine

## 2015-02-24 DIAGNOSIS — E039 Hypothyroidism, unspecified: Secondary | ICD-10-CM

## 2015-02-24 LAB — TSH: TSH: 4.79 mIU/L — ABNORMAL HIGH (ref 0.40–4.50)

## 2015-02-24 MED ORDER — LEVOTHYROXINE SODIUM 50 MCG PO TABS: 50.0000 ug | ORAL_TABLET | Freq: Every day | ORAL | Status: DC

## 2015-05-14 ENCOUNTER — Ambulatory Visit (INDEPENDENT_AMBULATORY_CARE_PROVIDER_SITE_OTHER): Payer: BLUE CROSS/BLUE SHIELD | Admitting: Physician Assistant

## 2015-05-14 DIAGNOSIS — E039 Hypothyroidism, unspecified: Secondary | ICD-10-CM | POA: Insufficient documentation

## 2015-05-14 DIAGNOSIS — I10 Essential (primary) hypertension: Secondary | ICD-10-CM | POA: Insufficient documentation

## 2015-05-14 DIAGNOSIS — M1710 Unilateral primary osteoarthritis, unspecified knee: Secondary | ICD-10-CM

## 2015-05-14 DIAGNOSIS — M179 Osteoarthritis of knee, unspecified: Secondary | ICD-10-CM | POA: Insufficient documentation

## 2015-05-14 DIAGNOSIS — E782 Mixed hyperlipidemia: Secondary | ICD-10-CM

## 2015-05-14 DIAGNOSIS — M79601 Pain in right arm: Secondary | ICD-10-CM | POA: Diagnosis not present

## 2015-05-14 HISTORY — DX: Unilateral primary osteoarthritis, unspecified knee: M17.10

## 2015-05-14 HISTORY — DX: Mixed hyperlipidemia: E78.2

## 2015-05-14 HISTORY — DX: Osteoarthritis of knee, unspecified: M17.9

## 2015-05-14 HISTORY — DX: Essential (primary) hypertension: I10

## 2015-05-14 MED ORDER — CYCLOBENZAPRINE HCL 5 MG PO TABS: 5.0000 mg | ORAL_TABLET | Freq: Three times a day (TID) | ORAL | Status: DC | PRN

## 2015-05-14 NOTE — Progress Notes (Signed)
Brandon [name]  MRN: [id number] DOB: [birth year]  Subjective:  Pt presents to clinic with right arm pain that started about 3 hours ago without an injury that he can think of.  The pains started just distal to the elbow and then the pain radiates distal and proximal to that area.  He took Advil but he is not sure if it helped.  He was typing and typing really caused increased pain.  He is starting to have some aching in her upper arm but he has been splinting the arm for a good while.  He has increased pain with movement of the elbow.  No paresthesias in the hand.  The most pain in with extension of the elbow.  Patient Active Problem List   Diagnosis Date Noted  . Essential hypertension 05/14/2015  . Elevated cholesterol 05/14/2015  . Hypothyroidism 05/14/2015  . Arthritis of knee, degenerative 05/14/2015    Current Outpatient Prescriptions on File Prior to Visit  Medication Sig Dispense Refill  . atorvastatin (LIPITOR) 20 MG tablet Take 20 mg by mouth daily.    Marland Kitchen levothyroxine (SYNTHROID, LEVOTHROID) 50 MCG tablet Take 1 tablet (50 mcg total) by mouth daily. 90 tablet 3  . LISINOPRIL PO Take 20 mg by mouth daily.     . meloxicam (MOBIC) 15 MG tablet Take 15 mg by mouth daily.     No current facility-administered medications on file prior to visit.    No Known Allergies  Review of Systems  Constitutional: Negative for fever and chills.   Objective:  BP 130/82 mmHg  Pulse 90  Temp(Src) 98.1 F (36.7 C) (Oral)  Resp 18  Ht  (1.854 m)  Wt 395 lb 12.8 oz (179.534 kg)  BMI 52.23 kg/m2  SpO2 95%  Physical Exam  Constitutional: He is oriented to person, place, and time and well-developed, well-nourished, and in no distress.  HENT:  Head: Normocephalic and atraumatic.  Right Ear: External ear normal.  Left Ear: External ear normal.  Eyes: Conjunctivae are normal.  Neck: Normal range of motion.  Pulmonary/Chest: Effort normal.  Musculoskeletal:       Right elbow: He  exhibits decreased range of motion (2nd to pain). He exhibits no swelling, no effusion and no laceration. No tenderness found. No radial head, no medial epicondyle, no lateral epicondyle and no olecranon process tenderness noted.       Left elbow: Normal.       Arms: Some increase pain with pronation of his arm but this does not increase with resistance - he has some pain with extension of wrist but it is not every time - he does not have increased pain with resistance.  Good radial pulse, good capillary refill - no pain in radial tunnel location  Neurological: He is alert and oriented to person, place, and time. He has normal sensation and normal strength. Gait normal.  Skin: Skin is warm and dry.  Psychiatric: Mood, memory, affect and judgment normal.    Assessment and Plan :  Right arm pain - Plan: cyclobenzaprine (FLEXERIL) 5 MG tablet, Sling, Care order/instruction  - ? Cause of his pain - he has a palpable area but no signs of infection - this does not appear to be an epicondylitis because he has no TTP over the epicondyle and he is able to move wrist without much pain - will sling his arm to rest the area and use warm moist compresses - if he does not improve we will get him in  to see sports medicine for an US for a diagnosis.  His questions were answered and he agrees with the plan.  D/w Dr Carman ChingGreene  Yaeli Hartung PA-C  [facility] 05/14/2015 5:16 PM

## 2015-05-14 NOTE — Patient Instructions (Addendum)
  Heat with uncooked rice in a sock.  Wear the sling.  Use the right arm minimally - if it hurts do not do it   IF you received an x-ray today, you will receive an invoice from [facility]. Please contact [facility] at [phone] with questions or concerns regarding your invoice.   IF you received labwork today, you will receive an invoice from [facility]. Please contact [facility] at [phone] with questions or concerns regarding your invoice.   Our billing staff will not be able to assist you with questions regarding bills from these companies.  You will be contacted with the lab results as soon as they are available. The fastest way to get your results is to activate your My Chart account. Instructions are located on the last page of this paperwork. If you have not heard from us regarding the results in 2 weeks, please contact this office.

## 2015-05-28 DIAGNOSIS — M25521 Pain in right elbow: Secondary | ICD-10-CM

## 2015-05-28 DIAGNOSIS — M25511 Pain in right shoulder: Secondary | ICD-10-CM

## 2015-05-28 DIAGNOSIS — G8929 Other chronic pain: Secondary | ICD-10-CM

## 2015-05-28 HISTORY — DX: Pain in right elbow: M25.521

## 2015-05-28 HISTORY — DX: Other chronic pain: G89.29

## 2015-06-01 ENCOUNTER — Other Ambulatory Visit: Payer: Self-pay | Admitting: Orthopedic Surgery

## 2015-06-01 DIAGNOSIS — R531 Weakness: Secondary | ICD-10-CM

## 2015-06-01 DIAGNOSIS — M25511 Pain in right shoulder: Secondary | ICD-10-CM

## 2015-06-15 ENCOUNTER — Other Ambulatory Visit: Payer: BLUE CROSS/BLUE SHIELD

## 2015-09-15 ENCOUNTER — Ambulatory Visit (INDEPENDENT_AMBULATORY_CARE_PROVIDER_SITE_OTHER): Payer: BLUE CROSS/BLUE SHIELD | Admitting: Physician Assistant

## 2015-09-15 VITALS — BP 144/84 | HR 88 | Temp 98.2°F | Resp 17 | Ht 73.0 in | Wt 389.0 lb

## 2015-09-15 DIAGNOSIS — R110 Nausea: Secondary | ICD-10-CM

## 2015-09-15 DIAGNOSIS — J029 Acute pharyngitis, unspecified: Secondary | ICD-10-CM | POA: Diagnosis not present

## 2015-09-15 DIAGNOSIS — R42 Dizziness and giddiness: Secondary | ICD-10-CM | POA: Diagnosis not present

## 2015-09-15 LAB — POCT URINALYSIS DIP (MANUAL ENTRY)
BILIRUBIN UA: NEGATIVE
Glucose, UA: NEGATIVE
Ketones, POC UA: NEGATIVE
Leukocytes, UA: NEGATIVE
NITRITE UA: NEGATIVE
PH UA: 5.5
Protein Ur, POC: NEGATIVE
Spec Grav, UA: 1.02
UROBILINOGEN UA: 0.2

## 2015-09-15 LAB — POCT CBC
Granulocyte percent: 65.3 %G (ref 37–80)
HCT, POC: 42.8 % — AB (ref 43.5–53.7)
HEMOGLOBIN: 14.8 g/dL (ref 14.1–18.1)
Lymph, poc: 2.5 (ref 0.6–3.4)
MCH: 31 pg (ref 27–31.2)
MCHC: 34.5 g/dL (ref 31.8–35.4)
MCV: 89.7 fL (ref 80–97)
MID (cbc): 0.6 (ref 0–0.9)
MPV: 7.5 fL (ref 0–99.8)
PLATELET COUNT, POC: 258 10*3/uL (ref 142–424)
POC Granulocyte: 5.9 (ref 2–6.9)
POC LYMPH PERCENT: 27.7 %L (ref 10–50)
POC MID %: 7 % (ref 0–12)
RBC: 4.77 M/uL (ref 4.69–6.13)
RDW, POC: 13.8 %
WBC: 9.1 10*3/uL (ref 4.6–10.2)

## 2015-09-15 LAB — POC MICROSCOPIC URINALYSIS (UMFC): Mucus: ABSENT

## 2015-09-15 LAB — COMPREHENSIVE METABOLIC PANEL
ALBUMIN: 4.5 g/dL (ref 3.6–5.1)
ALT: 39 U/L (ref 9–46)
AST: 22 U/L (ref 10–35)
Alkaline Phosphatase: 57 U/L (ref 40–115)
BILIRUBIN TOTAL: 0.4 mg/dL (ref 0.2–1.2)
BUN: 14 mg/dL (ref 7–25)
CHLORIDE: 100 mmol/L (ref 98–110)
CO2: 26 mmol/L (ref 20–31)
CREATININE: 0.9 mg/dL (ref 0.70–1.33)
Calcium: 9.9 mg/dL (ref 8.6–10.3)
Glucose, Bld: 144 mg/dL — ABNORMAL HIGH (ref 65–99)
Potassium: 4.6 mmol/L (ref 3.5–5.3)
SODIUM: 140 mmol/L (ref 135–146)
TOTAL PROTEIN: 7.2 g/dL (ref 6.1–8.1)

## 2015-09-15 LAB — POCT RAPID STREP A (OFFICE): Rapid Strep A Screen: NEGATIVE

## 2015-09-15 NOTE — Patient Instructions (Addendum)
Get plenty of rest and drink at least 64 ounces of water daily. You can expect your symptoms to resolve in the next 48-96 hours. If not, please return here or see your PCP.     IF you received an x-ray today, you will receive an invoice from [facility]. Please contact [facility] at [phone] with questions or concerns regarding your invoice.   IF you received labwork today, you will receive an invoice from [facility]. Please contact [facility] at [phone] with questions or concerns regarding your invoice.   Our billing staff will not be able to assist you with questions regarding bills from these companies.  You will be contacted with the lab results as soon as they are available. The fastest way to get your results is to activate your My Chart account. Instructions are located on the last page of this paperwork. If you have not heard from us regarding the results in 2 weeks, please contact this office.

## 2015-09-15 NOTE — Progress Notes (Signed)
Patient ID: Brandon [name], male    DOB: 04-12-1965, 50 y.o.   MRN: [id number]  PCP: Marylynn Pearson, MD  Subjective:   Chief Complaint  Patient presents with  . Nausea  . Dizziness    HPI Presents for evaluation of dizziness and nausea x 5 days.  Lightheaded, rather than overt dizziness. Felt like throat was closing slightly. HA. Some reflux last night. Has not had similar symptoms previously.  No fever, chills, vision changes, GI/GU symptoms.  Symptoms occur with exertion. Non-productive cough is infrequent. Sick contact at work with strep throat.  Former smoker. Father had an MI at age 54, without previous diagnosis of heart disease.  Dx'd with hypothyroidism here, and due to insurance issues, was seen elsewhere, where the repeat testing was normal.  Has gained 70 lbs back after gastric sleeve surgery, following which he lost 200 lbs.   Review of Systems As above.    Patient Active Problem List   Diagnosis Date Noted  . Chronic right shoulder pain 05/28/2015  . Right elbow pain 05/28/2015  . Essential hypertension 05/14/2015  . Elevated cholesterol 05/14/2015  . Hypothyroidism 05/14/2015  . Arthritis of knee, degenerative 05/14/2015     Prior to Admission medications   Medication Sig Start Date End Date Taking? Authorizing Provider  atorvastatin (LIPITOR) 20 MG tablet Take 20 mg by mouth daily.   Yes Historical Provider, MD  LISINOPRIL PO Take 20 mg by mouth daily.    Yes Historical Provider, MD  meloxicam (MOBIC) 15 MG tablet Take 15 mg by mouth daily.   Yes Historical Provider, MD  cyclobenzaprine (FLEXERIL) 5 MG tablet Take 1 tablet (5 mg total) by mouth 3 (three) times daily as needed for muscle spasms. Patient not taking: Reported on 09/15/2015 05/14/15   Morrell Riddle, PA-C  levothyroxine (SYNTHROID, LEVOTHROID) 50 MCG tablet Take 1 tablet (50 mcg total) by mouth daily. Patient not taking: Reported on 09/15/2015 02/24/15   Elvina Sidle, MD     No  Known Allergies     Objective:  Physical Exam  Results for orders placed or performed in visit on 09/15/15  POCT CBC  Result Value Ref Range   WBC 9.1 4.6 - 10.2 K/uL   Lymph, poc 2.5 0.6 - 3.4   POC LYMPH PERCENT 27.7 10 - 50 %L   MID (cbc) 0.6 0 - 0.9   POC MID % 7.0 0 - 12 %M   POC Granulocyte 5.9 2 - 6.9   Granulocyte percent 65.3 37 - 80 %G   RBC 4.77 4.69 - 6.13 M/uL   Hemoglobin 14.8 14.1 - 18.1 g/dL   HCT, POC 40.9 (A) 81.1 - 53.7 %   MCV 89.7 80 - 97 fL   MCH, POC 31.0 27 - 31.2 pg   MCHC 34.5 31.8 - 35.4 g/dL   RDW, POC 91.4 %   Platelet Count, POC 258 142 - 424 K/uL   MPV 7.5 0 - 99.8 fL  POCT urinalysis dipstick  Result Value Ref Range   Color, UA yellow yellow   Clarity, UA clear clear   Glucose, UA negative negative   Bilirubin, UA negative negative   Ketones, POC UA negative negative   Spec Grav, UA 1.020    Blood, UA trace-lysed (A) negative   pH, UA 5.5    Protein Ur, POC negative negative   Urobilinogen, UA 0.2    Nitrite, UA Negative Negative   Leukocytes, UA Negative Negative  POCT Microscopic Urinalysis ([facility])  Result  Value Ref Range   WBC,UR,HPF,POC None None WBC/hpf   RBC,UR,HPF,POC None None RBC/hpf   Bacteria None None, Too numerous to count   Mucus Absent Absent   Epithelial Cells, UR Per Microscopy Few (A) None, Too numerous to count cells/hpf  POCT rapid strep A  Result Value Ref Range   Rapid Strep A Screen Negative Negative     EKG reviewed with Dr. Katrinka BlazingSmith. NSR. No ischemia.     Assessment & Plan:   1. Dizzy Reassuring EKG. Suspect related to viral illness and anticipate spontaneous resolution in the next 2-5 days. Supportive care. If worsens/persists, will need additional evaluation. - EKG 12-Lead  2. Nausea without vomiting See above. Supportive care. - POCT CBC - POCT urinalysis dipstick - POCT Microscopic Urinalysis ([facility]) - Comprehensive metabolic panel  3. Sore throat Supportive care. Await TCx. - POCT rapid  strep A - Culture, Group A Strep   Fernande Brashelle S. Alieah Brinton, PA-C Physician Assistant-Certified [facility]

## 2015-09-17 LAB — CULTURE, GROUP A STREP: Organism ID, Bacteria: NORMAL

## 2015-09-20 ENCOUNTER — Encounter: Payer: Self-pay | Admitting: Physician Assistant

## 2016-01-21 ENCOUNTER — Ambulatory Visit: Payer: BLUE CROSS/BLUE SHIELD

## 2016-01-28 ENCOUNTER — Ambulatory Visit: Payer: BLUE CROSS/BLUE SHIELD

## 2016-02-04 ENCOUNTER — Ambulatory Visit: Payer: BLUE CROSS/BLUE SHIELD

## 2016-02-09 ENCOUNTER — Encounter: Payer: BLUE CROSS/BLUE SHIELD | Attending: Family Medicine | Admitting: *Deleted

## 2016-02-09 DIAGNOSIS — Z713 Dietary counseling and surveillance: Secondary | ICD-10-CM | POA: Diagnosis present

## 2016-02-09 DIAGNOSIS — I10 Essential (primary) hypertension: Secondary | ICD-10-CM | POA: Insufficient documentation

## 2016-02-09 DIAGNOSIS — E669 Obesity, unspecified: Secondary | ICD-10-CM | POA: Insufficient documentation

## 2016-02-09 DIAGNOSIS — E119 Type 2 diabetes mellitus without complications: Secondary | ICD-10-CM | POA: Insufficient documentation

## 2016-02-09 DIAGNOSIS — E7800 Pure hypercholesterolemia, unspecified: Secondary | ICD-10-CM | POA: Insufficient documentation

## 2016-02-11 NOTE — Progress Notes (Signed)
Patient was seen on 02/09/2016 for the first of a series of three diabetes self-management courses at [facility].  Patient Education Plan per assessed needs and concerns is to attend four course education program for Diabetes Self Management Education.  The following learning objectives were met by the patient during this class:  Describe diabetes  State some common risk factors for diabetes  Defines the role of glucose and insulin  Identifies type of diabetes and pathophysiology  Describe the relationship between diabetes and cardiovascular risk  State the members of the Healthcare Team  States the rationale for glucose monitoring  State when to test glucose  State their individual Target Range  State the importance of logging glucose readings  Describe how to interpret glucose readings  Identifies A1C target  Explain the correlation between A1c and eAG values  State symptoms and treatment of high blood glucose  State symptoms and treatment of low blood glucose  Explain proper technique for glucose testing  Identifies proper sharps disposal  Handouts given during class include:  Living Well with Diabetes book  Carb Counting and Meal Planning book  Meal Plan Card  Carbohydrate guide  Meal planning worksheet  Low Sodium Flavoring Tips  The diabetes portion plate  Y7Z to eAG Conversion Chart  Diabetes Medications  Diabetes Recommended Care Schedule  Support Group  Diabetes Success Plan  Core Class Satisfaction Survey  Follow-Up Plan:  Attend core 2

## 2016-02-16 ENCOUNTER — Encounter: Payer: BLUE CROSS/BLUE SHIELD | Attending: Family Medicine | Admitting: *Deleted

## 2016-02-16 DIAGNOSIS — Z713 Dietary counseling and surveillance: Secondary | ICD-10-CM | POA: Diagnosis not present

## 2016-02-16 DIAGNOSIS — I10 Essential (primary) hypertension: Secondary | ICD-10-CM | POA: Diagnosis not present

## 2016-02-16 DIAGNOSIS — E669 Obesity, unspecified: Secondary | ICD-10-CM | POA: Diagnosis not present

## 2016-02-16 DIAGNOSIS — E7800 Pure hypercholesterolemia, unspecified: Secondary | ICD-10-CM | POA: Insufficient documentation

## 2016-02-16 DIAGNOSIS — E119 Type 2 diabetes mellitus without complications: Secondary | ICD-10-CM | POA: Diagnosis not present

## 2016-02-16 NOTE — Progress Notes (Signed)

## 2016-02-23 ENCOUNTER — Ambulatory Visit: Payer: BLUE CROSS/BLUE SHIELD

## 2016-03-30 DIAGNOSIS — R7303 Prediabetes: Secondary | ICD-10-CM

## 2016-03-30 DIAGNOSIS — E119 Type 2 diabetes mellitus without complications: Secondary | ICD-10-CM

## 2016-03-30 DIAGNOSIS — R7989 Other specified abnormal findings of blood chemistry: Secondary | ICD-10-CM | POA: Insufficient documentation

## 2016-03-30 HISTORY — DX: Other specified abnormal findings of blood chemistry: R79.89

## 2016-03-30 HISTORY — DX: Prediabetes: R73.03

## 2016-03-30 HISTORY — DX: Type 2 diabetes mellitus without complications: E11.9

## 2016-04-05 DIAGNOSIS — Z0000 Encounter for general adult medical examination without abnormal findings: Secondary | ICD-10-CM

## 2016-04-05 HISTORY — DX: Encounter for general adult medical examination without abnormal findings: Z00.00

## 2016-05-03 DIAGNOSIS — J309 Allergic rhinitis, unspecified: Secondary | ICD-10-CM

## 2016-05-03 HISTORY — DX: Allergic rhinitis, unspecified: J30.9

## 2018-04-11 DIAGNOSIS — I249 Acute ischemic heart disease, unspecified: Secondary | ICD-10-CM

## 2018-04-11 HISTORY — DX: Acute ischemic heart disease, unspecified: I24.9

## 2018-04-22 DIAGNOSIS — R079 Chest pain, unspecified: Secondary | ICD-10-CM | POA: Diagnosis not present

## 2018-04-22 DIAGNOSIS — E7800 Pure hypercholesterolemia, unspecified: Secondary | ICD-10-CM

## 2018-04-22 DIAGNOSIS — I10 Essential (primary) hypertension: Secondary | ICD-10-CM

## 2018-04-23 ENCOUNTER — Inpatient Hospital Stay (HOSPITAL_COMMUNITY)
Admission: AD | Admit: 2018-04-23 | Discharge: 2018-04-24 | DRG: 247 | Disposition: A | Discharge status: Home / Self Care | Payer: BLUE CROSS/BLUE SHIELD | Source: Other Acute Inpatient Hospital | Attending: Internal Medicine | Admitting: Internal Medicine

## 2018-04-23 ENCOUNTER — Encounter (HOSPITAL_COMMUNITY)
Admission: AD | Disposition: A | Discharge status: Home / Self Care | Payer: Self-pay | Source: Other Acute Inpatient Hospital | Attending: Internal Medicine

## 2018-04-23 ENCOUNTER — Other Ambulatory Visit: Payer: Self-pay

## 2018-04-23 ENCOUNTER — Encounter (HOSPITAL_COMMUNITY): Payer: Self-pay | Admitting: Cardiology

## 2018-04-23 DIAGNOSIS — E782 Mixed hyperlipidemia: Secondary | ICD-10-CM | POA: Diagnosis present

## 2018-04-23 DIAGNOSIS — E039 Hypothyroidism, unspecified: Secondary | ICD-10-CM | POA: Diagnosis present

## 2018-04-23 DIAGNOSIS — I10 Essential (primary) hypertension: Secondary | ICD-10-CM | POA: Diagnosis present

## 2018-04-23 DIAGNOSIS — Z87891 Personal history of nicotine dependence: Secondary | ICD-10-CM

## 2018-04-23 DIAGNOSIS — Z79899 Other long term (current) drug therapy: Secondary | ICD-10-CM | POA: Diagnosis not present

## 2018-04-23 DIAGNOSIS — E785 Hyperlipidemia, unspecified: Secondary | ICD-10-CM | POA: Diagnosis present

## 2018-04-23 DIAGNOSIS — Z9884 Bariatric surgery status: Secondary | ICD-10-CM

## 2018-04-23 DIAGNOSIS — Z955 Presence of coronary angioplasty implant and graft: Secondary | ICD-10-CM

## 2018-04-23 DIAGNOSIS — I2510 Atherosclerotic heart disease of native coronary artery without angina pectoris: Secondary | ICD-10-CM | POA: Diagnosis present

## 2018-04-23 DIAGNOSIS — I249 Acute ischemic heart disease, unspecified: Secondary | ICD-10-CM

## 2018-04-23 DIAGNOSIS — Z79890 Hormone replacement therapy (postmenopausal): Secondary | ICD-10-CM | POA: Diagnosis not present

## 2018-04-23 DIAGNOSIS — I214 Non-ST elevation (NSTEMI) myocardial infarction: Principal | ICD-10-CM | POA: Diagnosis present

## 2018-04-23 DIAGNOSIS — Z8249 Family history of ischemic heart disease and other diseases of the circulatory system: Secondary | ICD-10-CM | POA: Diagnosis not present

## 2018-04-23 DIAGNOSIS — Z6841 Body Mass Index (BMI) 40.0 and over, adult: Secondary | ICD-10-CM

## 2018-04-23 DIAGNOSIS — E7800 Pure hypercholesterolemia, unspecified: Secondary | ICD-10-CM | POA: Diagnosis not present

## 2018-04-23 DIAGNOSIS — R079 Chest pain, unspecified: Secondary | ICD-10-CM | POA: Diagnosis not present

## 2018-04-23 HISTORY — DX: Adverse effect of unspecified anesthetic, initial encounter: T41.45XA

## 2018-04-23 HISTORY — PX: CORONARY STENT INTERVENTION: CATH118234

## 2018-04-23 HISTORY — DX: Atherosclerotic heart disease of native coronary artery without angina pectoris: I25.10

## 2018-04-23 HISTORY — DX: Acute ischemic heart disease, unspecified: I24.9

## 2018-04-23 HISTORY — DX: Other complications of anesthesia, initial encounter: T88.59XA

## 2018-04-23 HISTORY — PX: LEFT HEART CATH AND CORONARY ANGIOGRAPHY: CATH118249

## 2018-04-23 LAB — LIPID PANEL
Cholesterol: 180 mg/dL (ref 0–200)
HDL: 45 mg/dL (ref >40)
LDL Cholesterol: 100 mg/dL — ABNORMAL HIGH (ref 0–99)
Total CHOL/HDL Ratio: 4 RATIO
Triglycerides: 177 mg/dL — ABNORMAL HIGH (ref <150)
VLDL: 35 mg/dL (ref 0–40)

## 2018-04-23 LAB — COMPREHENSIVE METABOLIC PANEL
ALT: 46 U/L — ABNORMAL HIGH (ref 0–44)
AST: 65 U/L — ABNORMAL HIGH (ref 15–41)
Albumin: 3.9 g/dL (ref 3.5–5.0)
Alkaline Phosphatase: 46 U/L (ref 38–126)
Anion gap: 10 (ref 5–15)
BUN: 12 mg/dL (ref 6–20)
CO2: 25 mmol/L (ref 22–32)
Calcium: 9.2 mg/dL (ref 8.9–10.3)
Chloride: 104 mmol/L (ref 98–111)
Creatinine, Ser: 0.78 mg/dL (ref 0.61–1.24)
GFR calc Af Amer: >60 mL/min (ref >60)
GFR calc non Af Amer: >60 mL/min (ref >60)
Glucose, Bld: 113 mg/dL — ABNORMAL HIGH (ref 70–99)
Potassium: 3.9 mmol/L (ref 3.5–5.1)
Sodium: 139 mmol/L (ref 135–145)
Total Bilirubin: 0.8 mg/dL (ref 0.3–1.2)
Total Protein: 6.6 g/dL (ref 6.5–8.1)

## 2018-04-23 LAB — PHOSPHORUS: Phosphorus: 3.4 mg/dL (ref 2.5–4.6)

## 2018-04-23 LAB — POCT ACTIVATED CLOTTING TIME
Activated Clotting Time: 241 seconds
Activated Clotting Time: 285 seconds
Activated Clotting Time: 307 seconds

## 2018-04-23 LAB — TSH: TSH: 2.85 u[IU]/mL (ref 0.350–4.500)

## 2018-04-23 LAB — MAGNESIUM: Magnesium: 1.9 mg/dL (ref 1.7–2.4)

## 2018-04-23 SURGERY — LEFT HEART CATH AND CORONARY ANGIOGRAPHY
Anesthesia: LOCAL

## 2018-04-23 MED ORDER — VERAPAMIL HCL 2.5 MG/ML IV SOLN
INTRAVENOUS | Status: DC | PRN
  Administered 2018-04-23: 17:00:00 via INTRA_ARTERIAL

## 2018-04-23 MED ORDER — HYDRALAZINE HCL 20 MG/ML IJ SOLN: 10.0000 mg | INTRAMUSCULAR | Status: AC | PRN

## 2018-04-23 MED ORDER — SODIUM CHLORIDE 0.9% FLUSH: 3.0000 mL | INTRAVENOUS | Status: DC | PRN

## 2018-04-23 MED ORDER — ATORVASTATIN CALCIUM 80 MG PO TABS
80.0000 mg | ORAL_TABLET | Freq: Every day | ORAL | Status: DC
  Administered 2018-04-23: 19:00:00 80 mg via ORAL
  Filled 2018-04-23: qty 1

## 2018-04-23 MED ORDER — MIDAZOLAM HCL 2 MG/2ML IJ SOLN
INTRAMUSCULAR | Status: AC
  Filled 2018-04-23: qty 2

## 2018-04-23 MED ORDER — SODIUM CHLORIDE 0.9 % IV SOLN: INTRAVENOUS | Status: AC

## 2018-04-23 MED ORDER — FENTANYL CITRATE (PF) 100 MCG/2ML IJ SOLN
INTRAMUSCULAR | Status: AC
  Filled 2018-04-23: qty 2

## 2018-04-23 MED ORDER — SODIUM CHLORIDE 0.9 % IV SOLN
INTRAVENOUS | Status: DC
  Administered 2018-04-23: 17:00:00 via INTRAVENOUS

## 2018-04-23 MED ORDER — SODIUM CHLORIDE 0.9% FLUSH: 3.0000 mL | Freq: Two times a day (BID) | INTRAVENOUS | Status: DC

## 2018-04-23 MED ORDER — TICAGRELOR 90 MG PO TABS
90.0000 mg | ORAL_TABLET | Freq: Two times a day (BID) | ORAL | Status: DC
  Administered 2018-04-24: 90 mg via ORAL
  Filled 2018-04-23: qty 1

## 2018-04-23 MED ORDER — ONDANSETRON HCL 4 MG/2ML IJ SOLN: 4.0000 mg | Freq: Four times a day (QID) | INTRAMUSCULAR | Status: DC | PRN

## 2018-04-23 MED ORDER — IOHEXOL 350 MG/ML SOLN
INTRAVENOUS | Status: DC | PRN
  Administered 2018-04-23: 220 mL via INTRAVENOUS

## 2018-04-23 MED ORDER — METOPROLOL TARTRATE 12.5 MG HALF TABLET
12.5000 mg | ORAL_TABLET | Freq: Three times a day (TID) | ORAL | Status: DC
  Administered 2018-04-23: 22:00:00 12.5 mg via ORAL
  Filled 2018-04-23: qty 1

## 2018-04-23 MED ORDER — ACETAMINOPHEN 325 MG PO TABS
650.0000 mg | ORAL_TABLET | ORAL | Status: DC | PRN
  Administered 2018-04-23: 650 mg via ORAL
  Filled 2018-04-23: qty 2

## 2018-04-23 MED ORDER — FENTANYL CITRATE (PF) 100 MCG/2ML IJ SOLN
INTRAMUSCULAR | Status: DC | PRN
  Administered 2018-04-23 (×3): 25 ug via INTRAVENOUS

## 2018-04-23 MED ORDER — ASPIRIN 81 MG PO CHEW
81.0000 mg | CHEWABLE_TABLET | Freq: Every day | ORAL | Status: DC
  Administered 2018-04-24: 10:00:00 81 mg via ORAL
  Filled 2018-04-23: qty 1

## 2018-04-23 MED ORDER — HEPARIN BOLUS VIA INFUSION
4000.0000 [IU] | Freq: Once | INTRAVENOUS | Status: DC
  Filled 2018-04-23: qty 4000

## 2018-04-23 MED ORDER — SODIUM CHLORIDE 0.9 % IV SOLN: 250.0000 mL | INTRAVENOUS | Status: DC | PRN

## 2018-04-23 MED ORDER — LABETALOL HCL 5 MG/ML IV SOLN: 10.0000 mg | INTRAVENOUS | Status: AC | PRN

## 2018-04-23 MED ORDER — SODIUM CHLORIDE 0.9% FLUSH
3.0000 mL | Freq: Two times a day (BID) | INTRAVENOUS | Status: DC
  Administered 2018-04-23: 22:00:00 3 mL via INTRAVENOUS

## 2018-04-23 MED ORDER — TICAGRELOR 90 MG PO TABS
ORAL_TABLET | ORAL | Status: AC
  Filled 2018-04-23: qty 2

## 2018-04-23 MED ORDER — TICAGRELOR 90 MG PO TABS
ORAL_TABLET | ORAL | Status: DC | PRN
  Administered 2018-04-23: 180 mg via ORAL

## 2018-04-23 MED ORDER — NITROGLYCERIN 1 MG/10 ML FOR IR/CATH LAB
INTRA_ARTERIAL | Status: DC | PRN
  Administered 2018-04-23: 150 ug via INTRACORONARY
  Administered 2018-04-23: 200 ug via INTRACORONARY

## 2018-04-23 MED ORDER — HEPARIN (PORCINE) IN NACL 1000-0.9 UT/500ML-% IV SOLN
INTRAVENOUS | Status: DC | PRN
  Administered 2018-04-23 (×2): 500 mL

## 2018-04-23 MED ORDER — HEPARIN (PORCINE) IN NACL 1000-0.9 UT/500ML-% IV SOLN
INTRAVENOUS | Status: AC
  Filled 2018-04-23: qty 1000

## 2018-04-23 MED ORDER — ASPIRIN 81 MG PO CHEW: 81.0000 mg | CHEWABLE_TABLET | ORAL | Status: DC

## 2018-04-23 MED ORDER — NITROGLYCERIN 1 MG/10 ML FOR IR/CATH LAB
INTRA_ARTERIAL | Status: AC
  Filled 2018-04-23: qty 10

## 2018-04-23 MED ORDER — HEPARIN SODIUM (PORCINE) 1000 UNIT/ML IJ SOLN
INTRAMUSCULAR | Status: AC
  Filled 2018-04-23: qty 1

## 2018-04-23 MED ORDER — HEPARIN SODIUM (PORCINE) 1000 UNIT/ML IJ SOLN
INTRAMUSCULAR | Status: DC | PRN
  Administered 2018-04-23: 10000 [IU] via INTRAVENOUS
  Administered 2018-04-23: 6000 [IU] via INTRAVENOUS
  Administered 2018-04-23: 3000 [IU] via INTRAVENOUS

## 2018-04-23 MED ORDER — ATORVASTATIN CALCIUM 10 MG PO TABS: 20.0000 mg | ORAL_TABLET | Freq: Every day | ORAL | Status: DC

## 2018-04-23 MED ORDER — VERAPAMIL HCL 2.5 MG/ML IV SOLN
INTRAVENOUS | Status: AC
  Filled 2018-04-23: qty 2

## 2018-04-23 MED ORDER — MIDAZOLAM HCL 2 MG/2ML IJ SOLN
INTRAMUSCULAR | Status: DC | PRN
  Administered 2018-04-23 (×2): 1 mg via INTRAVENOUS
  Administered 2018-04-23: 2 mg via INTRAVENOUS

## 2018-04-23 MED ORDER — ASPIRIN 325 MG PO TABS: 325.0000 mg | ORAL_TABLET | Freq: Every day | ORAL | Status: DC

## 2018-04-23 MED ORDER — LIDOCAINE HCL (PF) 1 % IJ SOLN
INTRAMUSCULAR | Status: AC
  Filled 2018-04-23: qty 30

## 2018-04-23 MED ORDER — LIDOCAINE HCL (PF) 1 % IJ SOLN
INTRAMUSCULAR | Status: DC | PRN
  Administered 2018-04-23: 2 mL via SUBCUTANEOUS

## 2018-04-23 MED ORDER — HEPARIN (PORCINE) 25000 UT/250ML-% IV SOLN: 1650.0000 [IU]/h | INTRAVENOUS | Status: DC

## 2018-04-23 SURGICAL SUPPLY — 22 items
BALLN SAPPHIRE 2.5X12 (BALLOONS) ×2
BALLN SAPPHIRE ~~LOC~~ 3.25X15 (BALLOONS) ×2 IMPLANT
BALLOON SAPPHIRE 2.5X12 (BALLOONS) ×1 IMPLANT
CATH 5FR JL3.5 JR4 ANG PIG MP (CATHETERS) ×2 IMPLANT
CATH INFINITI 5FR JL4 (CATHETERS) ×2 IMPLANT
CATH LAUNCHER 5F EBU4.0 (CATHETERS) ×2 IMPLANT
CATH VISTA GUIDE 6FR AL1 (CATHETERS) ×2 IMPLANT
DEVICE RAD COMP TR BAND LRG (VASCULAR PRODUCTS) ×2 IMPLANT
GLIDESHEATH SLEND SS 6F .021 (SHEATH) ×2 IMPLANT
GUIDEWIRE INQWIRE 1.5J.035X260 (WIRE) ×1 IMPLANT
HOVERMATT SINGLE USE (MISCELLANEOUS) ×2 IMPLANT
INQWIRE 1.5J .035X260CM (WIRE) ×2
KIT ENCORE 26 ADVANTAGE (KITS) ×2 IMPLANT
KIT HEART LEFT (KITS) ×2 IMPLANT
KIT HEMO VALVE WATCHDOG (MISCELLANEOUS) ×2 IMPLANT
PACK CARDIAC CATHETERIZATION (CUSTOM PROCEDURE TRAY) ×2 IMPLANT
STENT RESOLUTE ONYX 2.75X34 (Permanent Stent) ×2 IMPLANT
STENT RESOLUTE ONYX 3.5X15 (Permanent Stent) ×2 IMPLANT
SYR MEDRAD MARK 7 150ML (SYRINGE) ×2 IMPLANT
TRANSDUCER W/STOPCOCK (MISCELLANEOUS) ×2 IMPLANT
TUBING CIL FLEX 10 FLL-RA (TUBING) ×2 IMPLANT
WIRE COUGAR XT STRL 190CM (WIRE) ×2 IMPLANT

## 2018-04-23 NOTE — Consult Note (Signed)
Cardiology Consultation:   Patient ID: Brandon [name] MRN: [id number]; DOB: 01/06/1966  Admit date: 04/23/2018 Date of Consult: 04/23/2018  Primary Care Provider: Burkhart, Brian, MD Primary Cardiologist: No primary care provider on file. New Primary Electrophysiologist:  None    Patient Profile:   Brandon [name] is a 53 y.o. male with a hx of HTN, HLD, morbid obesity- at one point he was 600 lbs and had a fib and sleep apnea, underwent gastric sleeve and lost weight and no further a fib or sleep apnea and premature family hx of CAD in his father who is being seen today for the evaluation of NSTEMI at the request of Dr. Ogbata.  History of Present Illness:   Brandon [name] with very mild coronary artery disease by cath 13 years ago priro to gastric sleeve and at that time atrial fib and sleep apnea.  With the wt loss no further a fib or sleep apnea.  He was 600 lbs.  Also hx of  + HTN, HLD and premature FH of CAD in his father.  He stated he was never on levothyroxine and no thyroid problems.  Pt was transferred from [facility] to [facility] today after NSTEMI and need for cardiac cath.     Pt has been having chest pain- an episode the day before yesterday but improved then yesterday an episode that did go away but returned quickly and he went to ER at [facility] around 8 pm, with chest pain, at that time more severe 10/10, squeezing like pain.  Pain did radiate to the L shoulder and Lt upper arm and into Lt neck.  Pain associated with diaphoresis, dizziness and SOB.  He felt he qould pas out at one point.  He was given NTG sl then paste and then morphine.   Pt also noted his uncle had recently died of COVID but in [place].      CXR no active disease  Hgb 15.4, WBC 10.2, Plts 308, INR 1 Na 139, K+ 4.1 BUN 22, Cr 1.20, AST 35 and ALT 49  Pro BNP 42  DDimer was neg.   Troponin overnight was 23.10    EKG:  The EKG was personally reviewed and demonstrates:  Initial EKG SB HR 51, some ST  depression, then EKG this AM SR with HR 63 Rt ward axis. EKG here at [facility] SR at 66 with new T wave inversion V1, new Q wave in I, and low voltage QRS. Telemetry:  Telemetry was personally reviewed and demonstrates:  SR  Currently mild squeezing in chest 2-3/10 on IV heparin, and ASA and BB but low dose-due to bradycardia, Lisinopril on hold. And on lipitor but home dose of 20 will increase.    Past Medical History:  Diagnosis Date  . Arthritis    both knees  . Hypertension   . Hypothyroidism 05/14/2015    Past Surgical History:  Procedure Laterality Date  . EXCISION HAGLUND'S DEFORMITY WITH ACHILLES TENDON REPAIR Right 05/22/2014   Procedure: RIGHT ACHILLES DEBRIDEMENT; HAGLUND'S EXCISION ;  Surgeon: John Hewitt, MD;  Location: [facility];  Service: Orthopedics;  Laterality: Right;  . GASTROCNEMIUS RECESSION Right 05/22/2014   Procedure: GASTROC RECESSION ;  Surgeon: John Hewitt, MD;  Location: [facility];  Service: Orthopedics;  Laterality: Right;  . KNEE SURGERY     l/knee  . LAPAROSCOPIC GASTRIC SLEEVE RESECTION    . MOUTH SURGERY     Root canal     Home Medications:  Prior to   Admission medications   Medication Sig Start Date End Date Taking? Authorizing Provider  atorvastatin (LIPITOR) 20 MG tablet Take 20 mg by mouth daily at 6 PM. 03/17/14  Yes [provider]  lisinopril (PRINIVIL,ZESTRIL) 20 MG tablet Take 20 mg by mouth daily. 03/17/14  Yes [provider]  meloxicam (MOBIC) 15 MG tablet Take 15 mg by mouth daily. 03/17/14  Yes [provider]  levothyroxine (SYNTHROID, LEVOTHROID) 50 MCG tablet Take 1 tablet (50 mcg total) by mouth daily. Patient not taking: Reported on 09/15/2015 02/24/15   Lauenstein, Kurt, MD   Pt denies being on levothyroxine   Inpatient Medications: Scheduled Meds: . aspirin  325 mg Oral Daily  . atorvastatin  20 mg Oral q1800  . heparin  4,000 Units Intravenous Once  . metoprolol tartrate  12.5 mg  Oral TID   Continuous Infusions: . heparin 1,650 Units/hr (04/23/18 1541)   PRN Meds:   Allergies:   No Known Allergies  Social History:   Social History   Socioeconomic History  . Marital status: Married    Spouse name: Not on file  . Number of children: Not on file  . Years of education: Not on file  . Highest education level: Not on file  Occupational History  . Not on file  Social Needs  . Financial resource strain: Not on file  . Food insecurity:    Worry: Not on file    Inability: Not on file  . Transportation needs:    Medical: Not on file    Non-medical: Not on file  Tobacco Use  . Smoking status: Former Smoker  . Smokeless tobacco: Never Used  . Tobacco comment: Quti smoking 20 years ago  Substance and Sexual Activity  . Alcohol use: Yes    Comment: occ  . Drug use: No  . Sexual activity: Yes  Lifestyle  . Physical activity:    Days per week: Not on file    Minutes per session: Not on file  . Stress: Not on file  Relationships  . Social connections:    Talks on phone: Not on file    Gets together: Not on file    Attends religious service: Not on file    Active member of club or organization: Not on file    Attends meetings of clubs or organizations: Not on file    Relationship status: Not on file  . Intimate partner violence:    Fear of current or ex partner: Not on file    Emotionally abused: Not on file    Physically abused: Not on file    Forced sexual activity: Not on file  Other Topics Concern  . Not on file  Social History Narrative  . Not on file    Family History:    Family History  Problem Relation Age of Onset  . Cancer Mother   . Heart disease Father 52  . Hypertension Father      ROS:  Please see the history of present illness.  General:no colds or fevers, no weight changes Skin:no rashes or ulcers HEENT:no blurred vision, no congestion CV:see HPI PUL:see HPI GI:no diarrhea constipation or melena, no indigestion GU:no  hematuria, no dysuria MS:no joint pain, no claudication Neuro:no syncope, no lightheadedness Endo:no diabetes, no thyroid disease  All other ROS reviewed and negative.     Physical Exam/Data:   Vitals:   04/23/18 1400  BP: 128/73  Pulse: 64  Temp: 98 F (36.7 C)  TempSrc: Oral    SpO2: 96%  Weight: (!) 156.4 kg  Height: 6' 2" (1.88 m)    Intake/Output Summary (Last 24 hours) at 04/23/2018 1624 Last data filed at 04/23/2018 1545 Gross per 24 hour  Intake 1.1 ml  Output -  Net 1.1 ml   Last 3 Weights 04/23/2018 02/09/2016 09/15/2015  Weight (lbs) 344 lb 14.4 oz 381 lb 9.6 oz 389 lb  Weight (kg) 156.446 kg 173.093 kg 176.449 kg     Body mass index is 44.28 kg/m.   Per Dr. Erie Sica  General:  Well nourished, well developed, morbidly obese male in no acute distress HEENT: normal Lymph: no adenopathy Neck: no JVD Endocrine:  No thryomegaly Vascular: No carotid bruits; FA pulses 2+ bilaterally  Cardiac:  normal S1, S2; RRR; no murmur  Lungs:  clear to auscultation bilaterally, no wheezing, rhonchi or rales  Abd: soft, nontender, no hepatomegaly  Ext: no edema. Prominent varicosities bilaterally. Musculoskeletal:  No deformities, BUE and BLE strength normal and equal Skin: warm and dry  Neuro:  CNs 2-12 intact, no focal abnormalities noted Psych:  Normal affect    Relevant CV Studies: Pending   Laboratory Data:  Chemistry Recent Labs  Lab 04/23/18 1547  NA 139  K 3.9  CL 104  CO2 25  GLUCOSE 113*  BUN 12  CREATININE 0.78  CALCIUM 9.2  GFRNONAA >60  GFRAA >60  ANIONGAP 10    Recent Labs  Lab 04/23/18 1547  PROT 6.6  ALBUMIN 3.9  AST 65*  ALT 46*  ALKPHOS 46  BILITOT 0.8   HematologyNo results for input(s): WBC, RBC, HGB, HCT, MCV, MCH, MCHC, RDW, PLT in the last 168 hours. Cardiac EnzymesNo results for input(s): TROPONINI in the last 168 hours. No results for input(s): TROPIPOC in the last 168 hours.  BNPNo results for input(s): BNP, PROBNP in the last  168 hours.  DDimer No results for input(s): DDIMER in the last 168 hours.  Radiology/Studies:  No results found.  Assessment and Plan:   NSTEMI pk troponin of 23- still with mild chest pain.  He is NPO, mild CAD on cath in 2013.  Plan cath -timing per Dr. Delisa Finck.    HTN stable on low dose BB, he was symptomatic brady in ER, his lisinopril is on hold for possible cath.    HLD, on Lipitor 20 mg will increase to 80 mg.  Morbid obesity with wt now at 344 but previously was 600 lbs prior to gastric sleeve.    Premature FH of CAD in his father  Prior hx of a fib and sleep apnea before gastric sleeve and wt loss.  Sinus brady in ER    For questions or updates, please contact [facility] Please consult [email or web address] for contact info under   Signed, Laura Ingold, NP  04/23/2018 4:24 PM  Patient seen, examined. Available data reviewed. Agree with findings, assessment, and plan as outlined by Laura Ingold, NP.  The physical exam outlined above reflects my personal exam findings.  The patient has stuttering chest discomfort now for approximately 1 week.  This was exacerbated by stress last week as the patient's uncle died of complications related to Covid-19.  However, his chest pain intensified yesterday and he developed severe substernal pain prompting him to seek evaluation at [facility].  His pain improved with morphine and IV heparin.  He has continued to have 2/10 intensity substernal chest discomfort that he describes as a pressure-like sensation.  This persists at the time of my evaluation after   he has arrived to [place].  His troponin is markedly elevated at 23 mg/dL.  EKG shows no acute ischemic changes.  With ongoing chest discomfort and significant troponin elevation consistent with non-ST elevation infarction, I have recommended urgent heart catheterization and possible PCI.  I have reviewed the risks, indications, and alternatives to cardiac catheterization, possible  angioplasty, and stenting with the patient. Risks include but are not limited to bleeding, infection, vascular injury, stroke, myocardial infection, arrhythmia, kidney injury, radiation-related injury in the case of prolonged fluoroscopy use, emergency cardiac surgery, and death. The patient understands the risks of serious complication is 1-2 in 1000 with diagnostic cardiac cath and 1-2% or less with angioplasty/stenting.   Cyndi Montejano, M.D. 04/23/2018 4:54 PM   

## 2018-04-23 NOTE — H&P (Signed)
History and Physical  Brandon [name] VXB:[id number] DOB: [birth year] DOA: 04/23/2018  Referring physician: Transferred from [facility] PCP: Marylynn Pearson, MD  Outpatient Specialists:    Patient coming from: [facility]  Chief Complaint: Chest pain  HPI: Patient is a 53 year old male with past medical history significant for morbid obesity, hypertension and hyperlipidemia.  Patient has family history significant for coronary artery disease.  Apparently, patient's father died in his 62s from complications of myocardial infarction.  Patient was transferred from [facility] to [facility] for possible cardiac catheterization.  On further questioning, patient reported that he has been having intermittent chest pain for about 3 weeks.  On the day of presentation, the chest pain was said to have become very severe, rated 10 out of 10, described as "squeezing-like", radiating to the left shoulder, left upper extremity and left side of the neck with associated diaphoresis, dizziness and shortness of breath.  Troponin has risen, and peaked in the 41s.  EKG revealed some lateral T wave changes.  Patient was seen by the cardiology team at [facility] and recommended transfer of patient to [facility] for cardiac catheterization.  No headache, no neck pain, no fever or chills, no cough, no URI symptoms, no urinary symptoms.  ED Course: Patient was transferred from [facility] Pertinent labs: Troponin peaked in the 20s EKG: Independently reviewed.  Lateral T wave changes.  Review of Systems: Negative for fever, visual changes, sore throat, rash, new muscle aches, dysuria, bleeding, n/v/abdominal pain.  Past Medical History:  Diagnosis Date  . Arthritis    both knees  . Hypertension   . Hypothyroidism 05/14/2015    Past Surgical History:  Procedure Laterality Date  . EXCISION HAGLUND'S DEFORMITY WITH ACHILLES TENDON REPAIR Right 05/22/2014   Procedure: RIGHT  ACHILLES DEBRIDEMENT; HAGLUND'S EXCISION ;  Surgeon: Toni Arthurs, MD;  Location: [facility];  Service: Orthopedics;  Laterality: Right;  . GASTROCNEMIUS RECESSION Right 05/22/2014   Procedure: GASTROC RECESSION ;  Surgeon: Toni Arthurs, MD;  Location: [facility];  Service: Orthopedics;  Laterality: Right;  . KNEE SURGERY     l/knee  . LAPAROSCOPIC GASTRIC SLEEVE RESECTION    . MOUTH SURGERY     Root canal     reports that he has quit smoking. He has never used smokeless tobacco. He reports current alcohol use. He reports that he does not use drugs.  No Known Allergies  Family History  Problem Relation Age of Onset  . Cancer Mother   . Heart disease Father   . Hypertension Father      Prior to Admission medications   Medication Sig Start Date End Date Taking? Authorizing Provider  atorvastatin (LIPITOR) 20 MG tablet Take 20 mg by mouth daily at 6 PM. 03/17/14  Yes [provider]  lisinopril (PRINIVIL,ZESTRIL) 20 MG tablet Take 20 mg by mouth daily. 03/17/14  Yes [provider]  meloxicam (MOBIC) 15 MG tablet Take 15 mg by mouth daily. 03/17/14  Yes [provider]  levothyroxine (SYNTHROID, LEVOTHROID) 50 MCG tablet Take 1 tablet (50 mcg total) by mouth daily. Patient not taking: Reported on 09/15/2015 02/24/15   Elvina Sidle, MD    Physical Exam: Vitals:   04/23/18 1400  BP: 128/73  Pulse: 64  Temp: 98 F (36.7 C)  TempSrc: Oral  SpO2: 96%  Weight: (!) 156.4 kg  Height: 6\' 2"  (1.88 m)     Constitutional:  . Appears calm and comfortable.  Morbidly  obese. Eyes:  . No pallor. No jaundice.  ENMT:  . external ears, nose appear normal Neck:  . Neck is supple. No JVD Respiratory:  . CTA bilaterally, no w/r/r.  . Respiratory effort normal. No retractions or accessory muscle use Cardiovascular:  . S1S2 . No LE extremity edema   Abdomen:  . Abdomen is obese, soft and non tender. Organs are difficult to assess.  Neurologic:  . Awake and alert. . Moves all limbs.  Wt Readings from Last 3 Encounters:  04/23/18 (!) 156.4 kg  02/09/16 (!) 173.1 kg  09/15/15 (!) 176.4 kg    I have personally reviewed following labs and imaging studies  Labs on Admission:  CBC: No results for input(s): WBC, NEUTROABS, HGB, HCT, MCV, PLT in the last 168 hours. Basic Metabolic Panel: No results for input(s): NA, K, CL, CO2, GLUCOSE, BUN, CREATININE, CALCIUM, MG, PHOS in the last 168 hours. Liver Function Tests: No results for input(s): AST, ALT, ALKPHOS, BILITOT, PROT, ALBUMIN in the last 168 hours. No results for input(s): LIPASE, AMYLASE in the last 168 hours. No results for input(s): AMMONIA in the last 168 hours. Coagulation Profile: No results for input(s): INR, PROTIME in the last 168 hours. Cardiac Enzymes: No results for input(s): CKTOTAL, CKMB, CKMBINDEX, TROPONINI in the last 168 hours. BNP (last 3 results) No results for input(s): PROBNP in the last 8760 hours. HbA1C: No results for input(s): HGBA1C in the last 72 hours. CBG: No results for input(s): GLUCAP in the last 168 hours. Lipid Profile: No results for input(s): CHOL, HDL, LDLCALC, TRIG, CHOLHDL, LDLDIRECT in the last 72 hours. Thyroid Function Tests: No results for input(s): TSH, T4TOTAL, FREET4, T3FREE, THYROIDAB in the last 72 hours. Anemia Panel: No results for input(s): VITAMINB12, FOLATE, FERRITIN, TIBC, IRON, RETICCTPCT in the last 72 hours. Urine analysis:    Component Value Date/Time   BILIRUBINUR negative 09/15/2015 1439   KETONESUR negative 09/15/2015 1439   PROTEINUR negative 09/15/2015 1439   UROBILINOGEN 0.2 09/15/2015 1439   NITRITE Negative 09/15/2015 1439   LEUKOCYTESUR Negative 09/15/2015 1439   Sepsis Labs: @LABRCNTIP (procalcitonin:4,lacticidven:4) )No results found for this or any previous visit (from the past 240 hour(s)).    Radiological Exams on Admission: No results found.  EKG: Independently reviewed.    Active Problems:   Acute coronary syndrome (HCC)   Assessment/Plan Non-STEMI: -Continue heparin drip -Aspirin -Beta-blocker -Fasting lipid profile -Optimize blood pressure and heart rate control -Cardiology consult -Likely cardiac cath, but will defer to cardiology team.  Morbid obesity: Diet and exercise Further management on outpatient basis  Hypertension: Controlled Hold lisinopril Start low-dose metoprolol (watch heart rate closely)  Hyperlipidemia: Lipid profile Continue Lipitor.  Further management will depend on hospital course.  DVT prophylaxis: Heparin drip Code Status: Full code Family Communication:  Disposition Plan: Will depend on hospital course.  Likely back on. Consults called: Cardiology Admission status: Inpatient  Time spent: 65 minutes minutes  Berton MountSylvester Kagen Kunath, MD  [facility] Pager #: [phone] 7PM-7AM contact night coverage as above  04/23/2018, 3:23 PM

## 2018-04-23 NOTE — Progress Notes (Signed)
   04/23/18 1430  Vitals  Cardiac Rhythm NSR  Pre-WUA / WUA Start  [place] Agitation Sedation Scale (RASS) 0  RASS Goal 0  Pain Assessment  Pain Scale 0-10  Pain Score 2  Patients Stated Pain Goal 0  Pain Type Acute pain  Pain Location Chest  Pain Descriptors / Indicators Aching  Pain Frequency Constant  Pain Onset On-going  Pain Intervention(s) MD notified (Comment);Medication (See eMAR);Elevated extremity;Emotional support;Rest  PCA/Epidural/Spinal Assessment  Respiratory Pattern Regular;Unlabored;Symmetrical  Glasgow Coma Scale  Eye Opening 4  Best Verbal Response (NON-intubated) 5  Best Motor Response 6  Glasgow Coma Scale Score 15   The patient arrived from [facility] ED with chest pain 2 out of 10 with a hep gtt infusing. Notified the MD Ogbata. I will continue to monitor the patient closely.   Sheppard Evens RN

## 2018-04-23 NOTE — Interval H&P Note (Signed)
Cath Lab Visit (complete for each Cath Lab visit)  Clinical Evaluation Leading to the Procedure:   ACS: Yes.    Non-ACS:    Anginal Classification: CCS IV  Anti-ischemic medical therapy: No Therapy  Non-Invasive Test Results: No non-invasive testing performed  Prior CABG: No previous CABG      History and Physical Interval Note:  04/23/2018 5:14 PM  Brandon [name]  has presented today for surgery, with the diagnosis of chest pain.  The various methods of treatment have been discussed with the patient and family. After consideration of risks, benefits and other options for treatment, the patient has consented to  Procedure(s): LEFT HEART CATH AND CORONARY ANGIOGRAPHY (N/A) as a surgical intervention.  The patient's history has been reviewed, patient examined, no change in status, stable for surgery.  I have reviewed the patient's chart and labs.  Questions were answered to the patient's satisfaction.     Tonny Bollman

## 2018-04-23 NOTE — H&P (View-Only) (Signed)
Cardiology Consultation:   Patient ID: Brandon [name] MRN: [id number]; DOB: 1965/06/22  Admit date: 04/23/2018 Date of Consult: 04/23/2018  Primary Care Provider: Marylynn Pearson, MD Primary Cardiologist: No primary care provider on file. New Primary Electrophysiologist:  None    Patient Profile:   Brandon [name] is a 53 y.o. male with a hx of HTN, HLD, morbid obesity- at one point he was 600 lbs and had a fib and sleep apnea, underwent gastric sleeve and lost weight and no further a fib or sleep apnea and premature family hx of CAD in his father who is being seen today for the evaluation of NSTEMI at the request of Dr. Dartha Lodge.  History of Present Illness:   Mr. Rodas with very mild coronary artery disease by cath 13 years ago priro to gastric sleeve and at that time atrial fib and sleep apnea.  With the wt loss no further a fib or sleep apnea.  He was 600 lbs.  Also hx of  + HTN, HLD and premature FH of CAD in his father.  He stated he was never on levothyroxine and no thyroid problems.  Pt was transferred from [place] to [facility] today after NSTEMI and need for cardiac cath.     Pt has been having chest pain- an episode the day before yesterday but improved then yesterday an episode that did go away but returned quickly and he went to ER at [facility] around 8 pm, with chest pain, at that time more severe 10/10, squeezing like pain.  Pain did radiate to the L shoulder and Lt upper arm and into Lt neck.  Pain associated with diaphoresis, dizziness and SOB.  He felt he qould pas out at one point.  He was given NTG sl then paste and then morphine.   Pt also noted his uncle had recently died of COVID but in [facility].      CXR no active disease  Hgb 15.4, WBC 10.2, Plts 308, INR 1 Na 139, K+ 4.1 BUN 22, Cr 1.20, AST 35 and ALT 49  Pro BNP 42  DDimer was neg.   Troponin overnight was 23.10    EKG:  The EKG was personally reviewed and demonstrates:  Initial EKG SB HR 51, some ST  depression, then EKG this AM SR with HR 63 Rt ward axis. EKG here at [facility] SR at 66 with new T wave inversion V1, new Q wave in I, and low voltage QRS. Telemetry:  Telemetry was personally reviewed and demonstrates:  SR  Currently mild squeezing in chest 2-3/10 on IV heparin, and ASA and BB but low dose-due to bradycardia, Lisinopril on hold. And on lipitor but home dose of 20 will increase.    Past Medical History:  Diagnosis Date  . Arthritis    both knees  . Hypertension   . Hypothyroidism 05/14/2015    Past Surgical History:  Procedure Laterality Date  . EXCISION HAGLUND'S DEFORMITY WITH ACHILLES TENDON REPAIR Right 05/22/2014   Procedure: RIGHT ACHILLES DEBRIDEMENT; HAGLUND'S EXCISION ;  Surgeon: Toni Arthurs, MD;  Location: [facility];  Service: Orthopedics;  Laterality: Right;  . GASTROCNEMIUS RECESSION Right 05/22/2014   Procedure: GASTROC RECESSION ;  Surgeon: Toni Arthurs, MD;  Location: [facility];  Service: Orthopedics;  Laterality: Right;  . KNEE SURGERY     l/knee  . LAPAROSCOPIC GASTRIC SLEEVE RESECTION    . MOUTH SURGERY     Root canal     Home Medications:  Prior to  Admission medications   Medication Sig Start Date End Date Taking? Authorizing Provider  atorvastatin (LIPITOR) 20 MG tablet Take 20 mg by mouth daily at 6 PM. 03/17/14  Yes [provider]  lisinopril (PRINIVIL,ZESTRIL) 20 MG tablet Take 20 mg by mouth daily. 03/17/14  Yes [provider]  meloxicam (MOBIC) 15 MG tablet Take 15 mg by mouth daily. 03/17/14  Yes [provider]  levothyroxine (SYNTHROID, LEVOTHROID) 50 MCG tablet Take 1 tablet (50 mcg total) by mouth daily. Patient not taking: Reported on 09/15/2015 02/24/15   Elvina Sidle, MD   Pt denies being on levothyroxine   Inpatient Medications: Scheduled Meds: . aspirin  325 mg Oral Daily  . atorvastatin  20 mg Oral q1800  . heparin  4,000 Units Intravenous Once  . metoprolol tartrate  12.5 mg  Oral TID   Continuous Infusions: . heparin 1,650 Units/hr (04/23/18 1541)   PRN Meds:   Allergies:   No Known Allergies  Social History:   Social History   Socioeconomic History  . Marital status: Married    Spouse name: Not on file  . Number of children: Not on file  . Years of education: Not on file  . Highest education level: Not on file  Occupational History  . Not on file  Social Needs  . Financial resource strain: Not on file  . Food insecurity:    Worry: Not on file    Inability: Not on file  . Transportation needs:    Medical: Not on file    Non-medical: Not on file  Tobacco Use  . Smoking status: Former Games developer  . Smokeless tobacco: Never Used  . Tobacco comment: Quti smoking 20 years ago  Substance and Sexual Activity  . Alcohol use: Yes    Comment: occ  . Drug use: No  . Sexual activity: Yes  Lifestyle  . Physical activity:    Days per week: Not on file    Minutes per session: Not on file  . Stress: Not on file  Relationships  . Social connections:    Talks on phone: Not on file    Gets together: Not on file    Attends religious service: Not on file    Active member of club or organization: Not on file    Attends meetings of clubs or organizations: Not on file    Relationship status: Not on file  . Intimate partner violence:    Fear of current or ex partner: Not on file    Emotionally abused: Not on file    Physically abused: Not on file    Forced sexual activity: Not on file  Other Topics Concern  . Not on file  Social History Narrative  . Not on file    Family History:    Family History  Problem Relation Age of Onset  . Cancer Mother   . Heart disease Father 70  . Hypertension Father      ROS:  Please see the history of present illness.  General:no colds or fevers, no weight changes Skin:no rashes or ulcers HEENT:no blurred vision, no congestion CV:see HPI PUL:see HPI GI:no diarrhea constipation or melena, no indigestion GU:no  hematuria, no dysuria MS:no joint pain, no claudication Neuro:no syncope, no lightheadedness Endo:no diabetes, no thyroid disease  All other ROS reviewed and negative.     Physical Exam/Data:   Vitals:   04/23/18 1400  BP: 128/73  Pulse: 64  Temp: 98 F (36.7 C)  TempSrc: Oral  SpO2: 96%  Weight: (!) 156.4 kg  Height: 6\' 2"  (1.88 m)    Intake/Output Summary (Last 24 hours) at 04/23/2018 1624 Last data filed at 04/23/2018 1545 Gross per 24 hour  Intake 1.1 ml  Output -  Net 1.1 ml   Last 3 Weights 04/23/2018 02/09/2016 09/15/2015  Weight (lbs) 344 lb 14.4 oz 381 lb 9.6 oz 389 lb  Weight (kg) 156.446 kg 173.093 kg 176.449 kg     Body mass index is 44.28 kg/m.   Per Dr. Excell Seltzerooper  General:  Well nourished, well developed, morbidly obese male in no acute distress HEENT: normal Lymph: no adenopathy Neck: no JVD Endocrine:  No thryomegaly Vascular: No carotid bruits; FA pulses 2+ bilaterally  Cardiac:  normal S1, S2; RRR; no murmur  Lungs:  clear to auscultation bilaterally, no wheezing, rhonchi or rales  Abd: soft, nontender, no hepatomegaly  Ext: no edema. Prominent varicosities bilaterally. Musculoskeletal:  No deformities, BUE and BLE strength normal and equal Skin: warm and dry  Neuro:  CNs 2-12 intact, no focal abnormalities noted Psych:  Normal affect    Relevant CV Studies: Pending   Laboratory Data:  Chemistry Recent Labs  Lab 04/23/18 1547  NA 139  K 3.9  CL 104  CO2 25  GLUCOSE 113*  BUN 12  CREATININE 0.78  CALCIUM 9.2  GFRNONAA >60  GFRAA >60  ANIONGAP 10    Recent Labs  Lab 04/23/18 1547  PROT 6.6  ALBUMIN 3.9  AST 65*  ALT 46*  ALKPHOS 46  BILITOT 0.8   HematologyNo results for input(s): WBC, RBC, HGB, HCT, MCV, MCH, MCHC, RDW, PLT in the last 168 hours. Cardiac EnzymesNo results for input(s): TROPONINI in the last 168 hours. No results for input(s): TROPIPOC in the last 168 hours.  BNPNo results for input(s): BNP, PROBNP in the last  168 hours.  DDimer No results for input(s): DDIMER in the last 168 hours.  Radiology/Studies:  No results found.  Assessment and Plan:   NSTEMI pk troponin of 23- still with mild chest pain.  He is NPO, mild CAD on cath in 2013.  Plan cath -timing per Dr. Excell Seltzerooper.    HTN stable on low dose BB, he was symptomatic brady in ER, his lisinopril is on hold for possible cath.    HLD, on Lipitor 20 mg will increase to 80 mg.  Morbid obesity with wt now at 344 but previously was 600 lbs prior to gastric sleeve.    Premature FH of CAD in his father  Prior hx of a fib and sleep apnea before gastric sleeve and wt loss.  Sinus brady in ER    For questions or updates, please contact [facility] Please consult [email or web address] for contact info under   Signed, Nada BoozerLaura Ingold, NP  04/23/2018 4:24 PM  Patient seen, examined. Available data reviewed. Agree with findings, assessment, and plan as outlined by Nada BoozerLaura Ingold, NP.  The physical exam outlined above reflects my personal exam findings.  The patient has stuttering chest discomfort now for approximately 1 week.  This was exacerbated by stress last week as the patient's uncle died of complications related to Covid-19.  However, his chest pain intensified yesterday and he developed severe substernal pain prompting him to [facility].  His pain improved with morphine and IV heparin.  He has continued to have 2/10 intensity substernal chest discomfort that he describes as a pressure-like sensation.  This persists at the time of my evaluation after  he has arrived to [facility].  His troponin is markedly elevated at 23 mg/dL.  EKG shows no acute ischemic changes.  With ongoing chest discomfort and significant troponin elevation consistent with non-ST elevation infarction, I have recommended urgent heart catheterization and possible PCI.  I have reviewed the risks, indications, and alternatives to cardiac catheterization, possible  angioplasty, and stenting with the patient. Risks include but are not limited to bleeding, infection, vascular injury, stroke, myocardial infection, arrhythmia, kidney injury, radiation-related injury in the case of prolonged fluoroscopy use, emergency cardiac surgery, and death. The patient understands the risks of serious complication is 1-2 in 1000 with diagnostic cardiac cath and 1-2% or less with angioplasty/stenting.   Tonny Bollman, M.D. 04/23/2018 4:54 PM

## 2018-04-23 NOTE — Progress Notes (Signed)
ANTICOAGULATION CONSULT NOTE - Initial Consult  Pharmacy Consult for heparin  Indication: chest pain/ACS  No Known Allergies  Patient Measurements: Height: 6\' 2"  (188 cm) Weight: (!) 344 lb 14.4 oz (156.4 kg) IBW/kg (Calculated) : 82.2 Heparin Dosing Weight: 119kg  Vital Signs: Temp: 98 F (36.7 C) (04/13 1400) Temp Source: Oral (04/13 1400) BP: 128/73 (04/13 1400) Pulse Rate: 64 (04/13 1400)  Labs: No results for input(s): HGB, HCT, PLT, APTT, LABPROT, INR, HEPARINUNFRC, HEPRLOWMOCWT, CREATININE, CKTOTAL, CKMB, TROPONINI in the last 72 hours.  CrCl cannot be calculated (Patient's most recent lab result is older than the maximum 21 days allowed.).   Medical History: Past Medical History:  Diagnosis Date  . Arthritis    both knees  . Hypertension   . Hypothyroidism 05/14/2015    Medications:  Medications Prior to Admission  Medication Sig Dispense Refill Last Dose  . atorvastatin (LIPITOR) 20 MG tablet Take 20 mg by mouth daily at 6 PM.   Past Week at Unknown time  . lisinopril (PRINIVIL,ZESTRIL) 20 MG tablet Take 20 mg by mouth daily.   Past Week at Unknown time  . meloxicam (MOBIC) 15 MG tablet Take 15 mg by mouth daily.   Past Week at Unknown time  . levothyroxine (SYNTHROID, LEVOTHROID) 50 MCG tablet Take 1 tablet (50 mcg total) by mouth daily. (Patient not taking: Reported on 09/15/2015) 90 tablet 3 Not Taking at Unknown time      Assessment: 53 yo male to begin heparin or r/o ACS. Labs pending. No anticoagulants noted PTA.   Goal of Therapy:  Heparin level 0.3-0.7 units/ml Monitor platelets by anticoagulation protocol: Yes   Plan:  -heparin 4000 unit bolus followed by 1650 units/hr -Heparin level in 6 hours and daily wth CBC daily  Harland German, PharmD Clinical Pharmacist **Pharmacist phone directory can now be found on [email or web address] ([email or web address]).  Listed under [facility].

## 2018-04-24 ENCOUNTER — Other Ambulatory Visit: Payer: Self-pay

## 2018-04-24 ENCOUNTER — Encounter: Payer: Self-pay | Admitting: Physician Assistant

## 2018-04-24 ENCOUNTER — Encounter (HOSPITAL_COMMUNITY): Payer: Self-pay | Admitting: Cardiovascular Disease

## 2018-04-24 LAB — CBC
HCT: 45.4 % (ref 39.0–52.0)
Hemoglobin: 14.8 g/dL (ref 13.0–17.0)
MCH: 30.7 pg (ref 26.0–34.0)
MCHC: 32.6 g/dL (ref 30.0–36.0)
MCV: 94.2 fL (ref 80.0–100.0)
Platelets: 256 10*3/uL (ref 150–400)
RBC: 4.82 MIL/uL (ref 4.22–5.81)
RDW: 13.2 % (ref 11.5–15.5)
WBC: 9 10*3/uL (ref 4.0–10.5)
nRBC: 0 % (ref 0.0–0.2)

## 2018-04-24 LAB — BASIC METABOLIC PANEL
Anion gap: 10 (ref 5–15)
BUN: 10 mg/dL (ref 6–20)
CO2: 24 mmol/L (ref 22–32)
Calcium: 9.4 mg/dL (ref 8.9–10.3)
Chloride: 104 mmol/L (ref 98–111)
Creatinine, Ser: 0.84 mg/dL (ref 0.61–1.24)
GFR calc Af Amer: >60 mL/min (ref >60)
GFR calc non Af Amer: >60 mL/min (ref >60)
Glucose, Bld: 98 mg/dL (ref 70–99)
Potassium: 4.1 mmol/L (ref 3.5–5.1)
Sodium: 138 mmol/L (ref 135–145)

## 2018-04-24 LAB — HIV ANTIBODY (ROUTINE TESTING W REFLEX): HIV Screen 4th Generation wRfx: NONREACTIVE

## 2018-04-24 MED ORDER — LISINOPRIL 10 MG PO TABS
10.0000 mg | ORAL_TABLET | Freq: Every day | ORAL | Status: DC
  Administered 2018-04-24: 10:00:00 10 mg via ORAL
  Filled 2018-04-24: qty 1

## 2018-04-24 MED ORDER — NITROGLYCERIN 0.4 MG SL SUBL: 0.4000 mg | SUBLINGUAL_TABLET | SUBLINGUAL | 3 refills | Status: AC | PRN

## 2018-04-24 MED ORDER — METOPROLOL TARTRATE 25 MG PO TABS: 25.0000 mg | ORAL_TABLET | Freq: Two times a day (BID) | ORAL | 11 refills | Status: DC

## 2018-04-24 MED ORDER — TICAGRELOR 90 MG PO TABS: 90.0000 mg | ORAL_TABLET | Freq: Two times a day (BID) | ORAL | 0 refills | Status: DC

## 2018-04-24 MED ORDER — ACETAMINOPHEN 325 MG PO TABS: 650.0000 mg | ORAL_TABLET | ORAL | Status: DC | PRN

## 2018-04-24 MED ORDER — ATORVASTATIN CALCIUM 80 MG PO TABS: 80.0000 mg | ORAL_TABLET | Freq: Every day | ORAL | 11 refills | Status: DC

## 2018-04-24 MED ORDER — METOPROLOL TARTRATE 25 MG PO TABS
25.0000 mg | ORAL_TABLET | Freq: Two times a day (BID) | ORAL | Status: DC
  Administered 2018-04-24: 25 mg via ORAL
  Filled 2018-04-24: qty 1

## 2018-04-24 MED ORDER — TICAGRELOR 90 MG PO TABS: 90.0000 mg | ORAL_TABLET | Freq: Two times a day (BID) | ORAL | 11 refills | Status: DC

## 2018-04-24 MED ORDER — ASPIRIN 81 MG PO CHEW: 81.0000 mg | CHEWABLE_TABLET | Freq: Every day | ORAL | Status: AC

## 2018-04-24 MED FILL — METOPROLOL TARTRATE 25 MG T: 25 | 30 days supply | Qty: 60 | Fill #0 | Status: TO

## 2018-04-24 MED FILL — BRILINTA 90 MG TABLET: 90 | 30 days supply | Qty: 60 | Fill #0 | Status: TO

## 2018-04-24 MED FILL — NITROGLYCERIN 0.4 MG TAB SL: 0.4 | 8 days supply | Qty: 25 | Fill #0 | Status: TO

## 2018-04-24 MED FILL — ATORVASTATIN CALCIUM 80 MG: 80 | 30 days supply | Qty: 30 | Fill #0 | Status: TO

## 2018-04-24 NOTE — Progress Notes (Signed)
Reviewed all d/c instructions with patient and copy of instructions given, verbalized understanding. IVs d/ced without problems.  Awaiting TOC pharmacy to bring meds for d/c.

## 2018-04-24 NOTE — Progress Notes (Addendum)
Cardiac Rehab Advisory Cardiac Rehab Phase I is not seeing pts face to face at this time due to Covid 19 restrictions. Ambulation is occurring through nursing, PT, and mobility teams. We will help facilitate that process as needed. We are calling pts in their rooms and discussing education. We will then deliver education materials to pts RN for delivery to pt.   Spoke to pt on phone. Discussed MI, stents, Brilinta/ASA, restrictions, diet, individualized exercising at home, NTG, and CRPII. Pt with good reception. Understands Brilinta/ASA, risk factor modification, and requests his referral be sent to [facility]. Will refer (program is currently closed). I will deliver his education materials to his RN. Gave pt education videos to watch as well.  [phone] Ethelda Chick CES, ACSM 10:17 AM 04/24/2018

## 2018-04-24 NOTE — Progress Notes (Signed)
Progress Note  Patient Name: Avrum Ramp Date of Encounter: 04/24/2018  Primary Cardiologist: No primary care provider on file.   Subjective   Patient feels well.  No recurrent chest pain.  No shortness of breath.  No complaints this morning.  Inpatient Medications    Scheduled Meds:  aspirin  81 mg Oral Daily   atorvastatin  80 mg Oral q1800   metoprolol tartrate  12.5 mg Oral TID   sodium chloride flush  3 mL Intravenous Q12H   ticagrelor  90 mg Oral BID   Continuous Infusions:  sodium chloride     PRN Meds: sodium chloride, acetaminophen, ondansetron (ZOFRAN) IV, sodium chloride flush   Vital Signs    Vitals:   04/23/18 1825 04/23/18 2034 04/24/18 0115 04/24/18 0517  BP: 133/85 136/67 126/84 119/84  Pulse: 62  (!) 56 62  Resp: 13 14 18 20   Temp:   97.7 F (36.5 C) (!) 97.5 F (36.4 C)  TempSrc:   Oral Oral  SpO2: 99%  94% 97%  Weight:    (!) 155.8 kg  Height:        Intake/Output Summary (Last 24 hours) at 04/24/2018 0731 Last data filed at 04/24/2018 0430 Gross per 24 hour  Intake 801.1 ml  Output 2850 ml  Net -2048.9 ml   Last 3 Weights 04/24/2018 04/23/2018 02/09/2016  Weight (lbs) 343 lb 6.4 oz 344 lb 14.4 oz 381 lb 9.6 oz  Weight (kg) 155.765 kg 156.446 kg 173.093 kg      Telemetry    Normal sinus rhythm without significant arrhythmia - Personally Reviewed  ECG    Normal sinus rhythm 61 bpm, within normal limits- Personally Reviewed  Physical Exam  Alert, oriented male in NAD GEN: No acute distress.   Neck: No JVD Cardiac: RRR, no murmurs, rubs, or gallops.  Respiratory: Clear to auscultation bilaterally. GI: Soft, nontender, non-distended  MS: No edema; No deformity.  Right radial site is clear with no hematoma or ecchymoses. Neuro:  Nonfocal  Psych: Normal affect   Labs    Chemistry Recent Labs  Lab 04/23/18 1547 04/24/18 0545  NA 139 138  K 3.9 4.1  CL 104 104  CO2 25 24  GLUCOSE 113* 98  BUN 12 10  CREATININE  0.78 0.84  CALCIUM 9.2 9.4  PROT 6.6  --   ALBUMIN 3.9  --   AST 65*  --   ALT 46*  --   ALKPHOS 46  --   BILITOT 0.8  --   GFRNONAA >60 >60  GFRAA >60 >60  ANIONGAP 10 10     Hematology Recent Labs  Lab 04/24/18 0545  WBC 9.0  RBC 4.82  HGB 14.8  HCT 45.4  MCV 94.2  MCH 30.7  MCHC 32.6  RDW 13.2  PLT 256    Cardiac EnzymesNo results for input(s): TROPONINI in the last 168 hours. No results for input(s): TROPIPOC in the last 168 hours.   BNPNo results for input(s): BNP, PROBNP in the last 168 hours.   DDimer No results for input(s): DDIMER in the last 168 hours.   Radiology    No results found.  Cardiac Studies   Cath: 04/23/18   Mid RCA lesion is 95% stenosed.  Ost 1st Mrg to 1st Mrg lesion is 90% stenosed.  Prox LAD lesion is 35% stenosed.  A drug-eluting stent was successfully placed using a STENT RESOLUTE ONYX 3.5X15.  Post intervention, there is a 0% residual stenosis.  A drug-eluting  stent was successfully placed using a STENT RESOLUTE ONYX [id number].  Post intervention, there is a 0% residual stenosis.   1.  Severe stenosis of the mid RCA, treated successfully with PCI using a drug-eluting stent (3.5 x 15 mm resolute Onyx DES) 2.  Severe stenosis of the first OM branch of the circumflex, treated successfully with PCI using a drug-eluting stent (2.75 x 34 mm resolute Onyx DES) 3.  Widely patent left main 4.  Mild nonobstructive disease of the LAD 5.  Normal LV systolic function  Recommendations: Aggressive post MI medical therapy, if no early complications arise, consider discharge home tomorrow morning.  Diagnostic  Dominance: Right    Intervention     Patient Profile     53 y.o. male with a hx of HTN, HLD, morbid obesity- at one point he was 600 lbs and had a fib and sleep apnea, underwent gastric sleeve and lost weight and no further a fib or sleep apnea and premature family hx of CAD in his father who was seen for the evaluation of  NSTEMI at the request of Dr. Dartha Lodgegbata. Underwent cardiac cath noted above.   Assessment & Plan    1. NSTEMI: Peak Troponin of 23 at OSH. Underwent cardiac cath noted above with Dr. Excell Seltzerooper. Successful PCI/DES x1 to mRCA, and PCI/DESx1 to 1st OM of Lcx. Placed on DAPT with ASA/Brilinta for at least one year. Normal LV function on LV gram. Phase I cardiac rehab ordered.  2. HL: LDL 100, now on high dose statin, increased on admission.   3. HTN: Tolerating low dose BB. Would plan to resume ACEi on discharge.  Will arrange for Tele-health TOC visit in 7-10 days.   [facility] will sign off.   Medication Recommendations:  See above Other recommendations (labs, testing, etc):  none Follow up as an outpatient:  Will arrange - Dr Dulce SellarMunley (his wife's cardiologist)  For questions or updates, please contact [facility] Please consult [email or web address] for contact info under     Signed, Laverda PageLindsay Roberts, NP  04/24/2018, 7:31 AM    Patient seen, examined. Available data reviewed. Agree with findings, assessment, and plan as outlined by Laverda PageLindsay Roberts, NP.  The physical exam findings documented above reflect my personal findings.  Changes are made where indicated.  The patient has done well following two-vessel PCI.  He has had no recurrence of chest pain.  Blood pressure and heart rate are well controlled.  I would recommend adding back his lisinopril a lower dose of 10 mg daily.  He is now on a high intensity statin drug, aspirin, and ticagrelor.  We discussed post-MI restrictions.  He will be out of work for 1 week, then can resume work as before.  Will arrange outpatient follow-up with Dr. Dulce SellarMunley.  We discussed easing back into a walking program.  Will encourage him to participate in phase 2 cardiac rehab once it is available following the COVID-19 restrictions.  Tonny BollmanMichael Eldean Nanna, M.D. 04/24/2018 8:50 AM

## 2018-04-24 NOTE — Discharge Summary (Signed)
Discharge Summary    Patient ID: Brandon [name],  MRN: [id number], DOB/AGE: Apr 05, 1965 53 y.o.  Admit date: 04/23/2018 Discharge date: 04/24/2018  Primary Care Provider: Olive Bass Primary Cardiologist: Norman Herrlich, MD   Discharge Diagnoses    Principal Problem:   Non-ST elevation (NSTEMI) myocardial infarction [facility]) Active Problems:   Essential hypertension   Hyperlipidemia LDL goal <70   Acute coronary syndrome (HCC)   Allergies No Known Allergies  Diagnostic Studies/Procedures    Cath: 04/23/18   Mid RCA lesion is 95% stenosed.  Ost 1st Mrg to 1st Mrg lesion is 90% stenosed.  Prox LAD lesion is 35% stenosed.  A drug-eluting stent was successfully placed using a STENT RESOLUTE ONYX 3.5X15.  Post intervention, there is a 0% residual stenosis.  A drug-eluting stent was successfully placed using a STENT RESOLUTE ONYX [id number].  Post intervention, there is a 0% residual stenosis.  1. Severe stenosis of the mid RCA, treated successfully with PCI using a drug-eluting stent (3.5 x 15 mm resolute Onyx DES) 2. Severe stenosis of the first OM branch of the circumflex, treated successfully with PCI using a drug-eluting stent (2.75 x 34 mm resolute Onyx DES) 3. Widely patent left main 4. Mild nonobstructive disease of the LAD 5. Normal LV systolic function  Recommendations: Aggressive post MI medical therapy, if no early complications arise, consider discharge home tomorrow morning.  Diagnostic  Dominance: Right    Intervention     _____________   History of Present Illness     53 y.o. male with a hx of HTN, HLD, morbid obesity- at one point he was 600 lbs and had a fib and sleep apnea, underwent gastric sleeve and lost weight and no further a fib or sleep apnea and premature family hx of CAD in his fatherwho was admitted 04/13 for the evaluation of NSTEMIby Dr. Dartha Lodge.   Hospital Course     Consultants: IM   He was pain-free on medical  therapy including aspirin, and heparin.  He was taken to the Cath Lab on 4/13, results are above.  He had dual vessel PCI and tolerated the procedure well.  His statin was increased to high dose, he was continued on his home lisinopril dose and was started on a beta-blocker.  He tolerated the medications well.  On 4/14, he was seen by Dr. Excell Seltzer and all data were reviewed.  No problem with his cath site.  He was ambulating without chest pain or shortness of breath.  He will be referred to outpatient cardiac rehab in [place].  Information was given to him so that he could start a walking program at home.    Medications were sent to the [facility] pharmacy at [facility].    No further inpatient work-up was indicated and he is considered stable for discharge, to follow-up as an outpatient in [place] with Dr. Dulce Sellar. _____________  Discharge Vitals Blood pressure 134/75, pulse 84, temperature 98.3 F (36.8 C), temperature source Oral, resp. rate 20, height  (1.88 m), weight (!) 155.8 kg, SpO2 97 %.  Filed Weights   04/23/18 1400 04/24/18 0517  Weight: (!) 156.4 kg (!) 155.8 kg    Labs & Radiologic Studies    CBC Recent Labs    04/24/18 0545  WBC 9.0  HGB 14.8  HCT 45.4  MCV 94.2  PLT 256   Basic Metabolic Panel Recent Labs    40/98/11 1547 04/24/18 0545  NA 139 138  K 3.9 4.1  CL 104 104  CO2 25 24  GLUCOSE 113* 98  BUN 12 10  CREATININE 0.78 0.84  CALCIUM 9.2 9.4  MG 1.9  --   PHOS 3.4  --    Liver Function Tests Recent Labs    04/23/18 1547  AST 65*  ALT 46*  ALKPHOS 46  BILITOT 0.8  PROT 6.6  ALBUMIN 3.9    Fasting Lipid Panel Recent Labs    04/23/18 1547  CHOL 180  HDL 45  LDLCALC 100*  TRIG 177*  CHOLHDL 4.0   Thyroid Function Tests TSH  Date Value Ref Range Status  04/23/2018 2.850 0.350 - 4.500 uIU/mL Final    Comment:    Performed by a 3rd Generation assay with a functional sensitivity of <=0.01 uIU/mL. Performed at [facility], [address] [place], [place] [address]   02/23/2015 4.79 (H) 0.40 - 4.50 mIU/L Final    Comment:    ** Please note change in unit of measure and reference range(s). **       _____________  No results found. Disposition   Pt is being discharged home today in good condition.  Follow-up Plans & Appointments    Follow-up Information    Baldo DaubMunley, Brian J, MD Follow up on 05/16/2018.   Specialty:  Cardiology Why:  Please arrive at 9:15 am for a 9:30 am appt  Contact information: [address] [place] [address] [phone]          Discharge Instructions    AMB Referral to Phase II Cardiac Rehab   Complete by:  As directed    Prefers Rehab in [place].   Diagnosis:  Coronary Stents   Diet - low sodium heart healthy   Complete by:  As directed    Increase activity slowly   Complete by:  As directed       Discharge Medications   Allergies as of 04/24/2018   No Known Allergies     Medication List    STOP taking these medications   meloxicam 15 MG tablet Commonly known as:  MOBIC     TAKE these medications   acetaminophen 325 MG tablet Commonly known as:  TYLENOL Take 2 tablets (650 mg total) by mouth every 4 (four) hours as needed for headache or mild pain.   aspirin 81 MG chewable tablet Chew 1 tablet (81 mg total) by mouth daily.   atorvastatin 80 MG tablet Commonly known as:  LIPITOR Take 1 tablet (80 mg total) by mouth daily at 6 PM. What changed:    medication strength  how much to take   levothyroxine 50 MCG tablet Commonly known as:  SYNTHROID, LEVOTHROID Take 1 tablet (50 mcg total) by mouth daily.   lisinopril 20 MG tablet Commonly known as:  PRINIVIL,ZESTRIL Take 20 mg by mouth daily.   metoprolol tartrate 25 MG tablet Commonly known as:  LOPRESSOR Take 1 tablet (25 mg total) by mouth 2 (two) times daily.   nitroGLYCERIN 0.4 MG SL tablet Commonly known as:  Nitrostat Place 1 tablet (0.4 mg total) under the tongue every  5 (five) minutes as needed for chest pain.   ticagrelor 90 MG Tabs tablet Commonly known as:  BRILINTA Take 1 tablet (90 mg total) by mouth 2 (two) times daily.        Aspirin prescribed at discharge?  Yes High Intensity Statin Prescribed? (Lipitor 40-80mg  or Crestor 20-40mg ): Yes Beta Blocker Prescribed? Yes For EF <40%, was ACEI/ARB Prescribed? No: EF >  40%, but home ACE was continued ADP Receptor Inhibitor Prescribed? (i.e. Plavix etc.-Includes Medically Managed Patients): Yes For EF <40%, Aldosterone Inhibitor Prescribed? No: EF > 40% Was EF assessed during THIS hospitalization? Yes Was Cardiac Rehab II ordered? (Included Medically managed Patients): Yes   Outstanding Labs/Studies   None  Duration of Discharge Encounter   Greater than 30 minutes including physician time.  Bobbye Riggs Blakeleigh Domek NP 04/24/2018, 9:48 AM

## 2018-05-01 ENCOUNTER — Telehealth: Payer: Self-pay | Admitting: Cardiology

## 2018-05-01 NOTE — Telephone Encounter (Signed)
Patient reports being scheduled for a crown placement several weeks ago but it was cancelled due to COVID. His states the tooth is crumbling and has become increasingly painful. He is concerned with getting an infection from that being left untreated and also is having continuous 4-5/10 pain which is making it difficult to sleep. Patient is s/p percutaneous stent placement 1 week ago. He wants to know what he can safely take for pain control given his cardiac history.  pls advise, tx

## 2018-05-01 NOTE — Telephone Encounter (Signed)
Patient is having severe dental pain, what pain killer can he take with his heart  Until he can get into dentist? Please advise.

## 2018-05-01 NOTE — Telephone Encounter (Signed)
Phoned and informed of Dr. Hulen Shouts recommendation to contact an oral surgeon regarding dental problem and that he can take up to 400mg  Ibuprofen every 6 hours as needed for pain. Pt verbalizes understanding and has no further questions or concerns.

## 2018-05-01 NOTE — Telephone Encounter (Signed)
I would advise him to call an oral surgeon their offices are open and they do see patients or emergency.  Over-the-counter ibuprofen up to 400 mg every 6 hours

## 2018-05-01 NOTE — Telephone Encounter (Signed)
Returned patient's call on mobile phone, no answer, left voice message with return call number.

## 2018-05-02 ENCOUNTER — Telehealth: Payer: Self-pay | Admitting: Cardiology

## 2018-05-02 NOTE — Telephone Encounter (Signed)
Virtual Visit Pre-Appointment Phone Call  "(Name), I am calling you today to discuss your upcoming appointment. We are currently trying to limit exposure to the virus that causes COVID-19 by seeing patients at home rather than in the office."  1. "What is the BEST phone number to call the day of the visit?" - include this in appointment notes  2. Do you have or have access to (through a family member/friend) a smartphone with video capability that we can use for your visit?" a. If yes - list this number in appt notes as cell (if different from BEST phone #) and list the appointment type as a VIDEO visit in appointment notes b. If no - list the appointment type as a PHONE visit in appointment notes  3. Confirm consent - "In the setting of the current Covid19 crisis, you are scheduled for a (phone or video) visit with your provider on (date) at (time).  Just as we do with many in-office visits, in order for you to participate in this visit, we must obtain consent.  If you'd like, I can send this to your mychart (if signed up) or email for you to review.  Otherwise, I can obtain your verbal consent now.  All virtual visits are billed to your insurance company just like a normal visit would be.  By agreeing to a virtual visit, we'd like you to understand that the technology does not allow for your provider to perform an examination, and thus may limit your provider's ability to fully assess your condition. If your provider identifies any concerns that need to be evaluated in person, we will make arrangements to do so.  Finally, though the technology is pretty good, we cannot assure that it will always work on either your or our end, and in the setting of a video visit, we may have to convert it to a phone-only visit.  In either situation, we cannot ensure that we have a secure connection.  Are you willing to proceed?" STAFF: Did the patient verbally acknowledge consent to telehealth visit? Document  YES/NO here: YES  4. Advise patient to be prepared - "Two hours prior to your appointment, go ahead and check your blood pressure, pulse, oxygen saturation, and your weight (if you have the equipment to check those) and write them all down. When your visit starts, your provider will ask you for this information. If you have an Apple Watch or Kardia device, please plan to have heart rate information ready on the day of your appointment. Please have a pen and paper handy nearby the day of the visit as well."  5. Give patient instructions for MyChart download to smartphone OR Doximity/Doxy.me as below if video visit (depending on what platform provider is using)  6. Inform patient they will receive a phone call 15 minutes prior to their appointment time (may be from unknown caller ID) so they should be prepared to answer    TELEPHONE CALL NOTE  Brandon [name] has been deemed a candidate for a follow-up tele-health visit to limit community exposure during the Covid-19 pandemic. I spoke with the patient via phone to ensure availability of phone/video source, confirm preferred email & phone number, and discuss instructions and expectations.  I reminded Brandon [name] to be prepared with any vital sign and/or heart rhythm information that could potentially be obtained via home monitoring, at the time of his visit. I reminded Brandon [name] to expect a phone call prior to his visit.  Brandon ParrDenise Wilson 05/02/2018 4:32 PM   INSTRUCTIONS FOR DOWNLOADING THE MYCHART APP TO SMARTPHONE  - The patient must first make sure to have activated MyChart and know their login information - If Apple, go to [employer] Store and type in MyChart in the search bar and download the app. If Android, ask patient to go to [employer] Play Store and type in [place] in the search bar and download the app. The app is free but as with any other app downloads, their phone may require them to verify saved payment information or Apple/Android  password.  - The patient will need to then log into the app with their MyChart username and password, and select [facility] as their healthcare provider to link the account. When it is time for your visit, go to the MyChart app, find appointments, and click Begin Video Visit. Be sure to Select Allow for your device to access the Microphone and Camera for your visit. You will then be connected, and your provider will be with you shortly.  **If they have any issues connecting, or need assistance please contact MyChart service desk [phone])**  **If using a computer, in order to ensure the best quality for their visit they will need to use either of the following Internet Browsers: [employer] Edge, or Google Chrome**  IF USING DOXIMITY or DOXY.ME - The patient will receive a link just prior to their visit by text.     FULL LENGTH CONSENT FOR TELE-HEALTH VISIT   I hereby voluntarily request, consent and authorize [facility] and its employed or contracted physicians, physician assistants, nurse practitioners or other licensed health care professionals (the Practitioner), to provide me with telemedicine health care services (the Services") as deemed necessary by the treating Practitioner. I acknowledge and consent to receive the Services by the Practitioner via telemedicine. I understand that the telemedicine visit will involve communicating with the Practitioner through live audiovisual communication technology and the disclosure of certain medical information by electronic transmission. I acknowledge that I have been given the opportunity to request an in-person assessment or other available alternative prior to the telemedicine visit and am voluntarily participating in the telemedicine visit.  I understand that I have the right to withhold or withdraw my consent to the use of telemedicine in the course of my care at any time, without affecting my right to future care or treatment,  and that the Practitioner or I may terminate the telemedicine visit at any time. I understand that I have the right to inspect all information obtained and/or recorded in the course of the telemedicine visit and may receive copies of available information for a reasonable fee.  I understand that some of the potential risks of receiving the Services via telemedicine include:   Delay or interruption in medical evaluation due to technological equipment failure or disruption;  Information transmitted may not be sufficient (e.g. poor resolution of images) to allow for appropriate medical decision making by the Practitioner; and/or   In rare instances, security protocols could fail, causing a breach of personal health information.  Furthermore, I acknowledge that it is my responsibility to provide information about my medical history, conditions and care that is complete and accurate to the best of my ability. I acknowledge that Practitioner's advice, recommendations, and/or decision may be based on factors not within their control, such as incomplete or inaccurate data provided by me or distortions of diagnostic images or specimens that may result from electronic transmissions. I understand that the practice  of medicine is not an Visual merchandiser and that Practitioner makes no warranties or guarantees regarding treatment outcomes. I acknowledge that I will receive a copy of this consent concurrently upon execution via email to the email address I last provided but may also request a printed copy by calling the office of [facility].    I understand that my insurance will be billed for this visit.   I have read or had this consent read to me.  I understand the contents of this consent, which adequately explains the benefits and risks of the Services being provided via telemedicine.   I have been provided ample opportunity to ask questions regarding this consent and the Services and have had my questions  answered to my satisfaction.  I give my informed consent for the services to be provided through the use of telemedicine in my medical care  By participating in this telemedicine visit I agree to the above.  YES        Cardiac Questionnaire:    Since your last visit or hospitalization:    1. Have you been having new or worsening chest pain? no 2. Have you been having new or worsening shortness of breath? A little no   3. Have you been having new or worsening leg swelling, wt gain, or increase in abdominal girth (pants fitting more tightly)? No   4. Have you had any passing out spells? no    *A YES to any of these questions would result in the appointment being kept. *If all the answers to these questions are NO, we should indicate that given the current situation regarding the worldwide coronarvirus pandemic, at the recommendation of the CDC, we are looking to limit gatherings in our waiting area, and thus will reschedule their appointment beyond four weeks from today.   _____________   COVID-19 Pre-Screening Questions:   Do you currently have a fever? no(yes = cancel and refer to pcp for e-visit)  Have you recently travelled on a cruise, internationally, or to [place], [place], [place], [place], [place], or [place]) ? no (yes = cancel, stay home, monitor symptoms, and contact pcp or initiate e-visit if symptoms develop)  Have you been in contact with someone that is currently pending confirmation of Covid19 testing or has been confirmed to have the Covid19 virus?  no (yes = cancel, stay home, away from tested individual, monitor symptoms, and contact pcp or initiate e-visit if symptoms develop)  Are you currently experiencing fatigue or cough? no (yes = pt should be prepared to have a mask placed at the time of their visit).

## 2018-05-06 DIAGNOSIS — I2510 Atherosclerotic heart disease of native coronary artery without angina pectoris: Secondary | ICD-10-CM

## 2018-05-06 HISTORY — DX: Atherosclerotic heart disease of native coronary artery without angina pectoris: I25.10

## 2018-05-06 NOTE — Progress Notes (Signed)
Virtual Visit via Video Note   This visit type was conducted due to national recommendations for restrictions regarding the COVID-19 Pandemic (e.g. social distancing) in an effort to limit this patient's exposure and mitigate transmission in our community.  Due to his co-morbid illnesses, this patient is at least at moderate risk for complications without adequate follow up.  This format is felt to be most appropriate for this patient at this time.  All issues noted in this document were discussed and addressed.  A limited physical exam was performed with this format.  Please refer to the patient's chart for his consent to telehealth for [facility].   Evaluation Performed:  Follow-up visit  Date:  05/07/2018   ID:  Brandon [name], DOB Mar 16, 1965, MRN [id number]  Patient Location: Home Provider Location: Home  PCP:  Olive Bass, MD  Cardiologist:  Norman Herrlich, MD  Electrophysiologist:  None   Chief Complaint: Follow-up after recent non-ST elevation MI PCI and stent  History of Present Illness:    Brandon [name] is a 53 y.o. male with with hypertension, hyperlipidemia  prediabetes and recent non STEMI and recent PCI and DES of mid RCA and OM1 04/23/18.  Overall I would say is not doing well he finds that his strength and endurance are diminished and he short of breath when he walks outdoors but is not severely limiting.  It does not seem to be related to when he takes his Brilinta he has no bronchospasm edema orthopnea and has had no chest pain.  Other issues the concern is anxiety which is worsened he inquired about Xanax and I told him I be hesitant to use the drug because of its addictive potential.  He asked about stress reduction techniques and I think what he really needs to [name] is to get into cardiac rehabilitation to address nutrition lifestyle stress reduction and exercise.  Program is presently closed there is discussion of reopening one-to-one and I will contact him to put  him on the list.  Other concerns on his part include dental pain he was to have some procedure performed to preserve his tooth and tells me the paperwork was sent to me that has not been completed to the best my ability I received nothing from his dentist and I told him there is no restriction except I cannot stop aspirin and Brilinta and if they cannot [name] it and he needs to go to an oral surgeon.  He had significant dyslipidemia with an LDL of 114 on a high intensity statin I will bring him back to my office reassess his lipids and I suspect he requires a second agent either PCSK9 or Zetia and also check LP(a) level.  He had normal left ventricular function I suspect his shortness of breath is not cardiac but will also go ahead and check LP(a) and proBNP.  04/23/18:   Conclusion    Mid RCA lesion is 95% stenosed.  Ost 1st Mrg to 1st Mrg lesion is 90% stenosed.  Prox LAD lesion is 35% stenosed.  A drug-eluting stent was successfully placed using a STENT RESOLUTE ONYX 3.5X15.  Post intervention, there is a 0% residual stenosis.  A drug-eluting stent was successfully placed using a STENT RESOLUTE ONYX [id number].  Post intervention, there is a 0% residual stenosis.   1.  Severe stenosis of the mid RCA, treated successfully with PCI using a drug-eluting stent (3.5 x 15 mm resolute Onyx DES) 2.  Severe stenosis of the first OM branch  of the circumflex, treated successfully with PCI using a drug-eluting stent (2.75 x 34 mm resolute Onyx DES) 3.  Widely patent left main 4.  Mild nonobstructive disease of the LAD 5.  Normal LV systolic function   Coronary Diagrams  Diagnostic  Dominance: Right    Intervention     The patient does not have symptoms concerning for COVID-19 infection (fever, chills, cough, or new shortness of breath).    Past Medical History:  Diagnosis Date  . ACS (acute coronary syndrome) (HCC) 04/2018  . Acute coronary syndrome (HCC) 04/23/2018  . Arthritis    both  knees  . Arthritis of knee, degenerative 05/14/2015  . CAD in native artery 05/06/2018  . Chronic right shoulder pain 05/28/2015  . Complication of anesthesia    " i HAVE A HARD TIME WAKING UP "  . Coronary artery disease   . Essential hypertension 05/14/2015  . Hypertension   . Hypothyroidism 05/14/2015  . Mixed hyperlipidemia 05/14/2015  . Non-ST elevation (NSTEMI) myocardial infarction (HCC)   . Pre-diabetes 03/30/2016   2017: x/6.7 2018: 103/6.4  . Right elbow pain 05/28/2015   Past Surgical History:  Procedure Laterality Date  . CORONARY STENT INTERVENTION N/A 04/23/2018   Procedure: CORONARY STENT INTERVENTION;  Surgeon: Tonny Bollmanooper, Michael, MD;  [facility];  Service: Cardiovascular;  Laterality: N/A;  . EXCISION HAGLUND'S DEFORMITY WITH ACHILLES TENDON REPAIR Right 05/22/2014   Procedure: RIGHT ACHILLES DEBRIDEMENT; HAGLUND'S EXCISION ;  Surgeon: Toni ArthursJohn Hewitt, MD;  Location: [facility];  Service: Orthopedics;  Laterality: Right;  . GASTROCNEMIUS RECESSION Right 05/22/2014   Procedure: GASTROC RECESSION ;  Surgeon: Toni ArthursJohn Hewitt, MD;  Location: [facility];  Service: Orthopedics;  Laterality: Right;  . KNEE SURGERY     l/knee  . LAPAROSCOPIC GASTRIC SLEEVE RESECTION    . LEFT HEART CATH AND CORONARY ANGIOGRAPHY N/A 04/23/2018   Procedure: LEFT HEART CATH AND CORONARY ANGIOGRAPHY;  Surgeon: Tonny Bollmanooper, Michael, MD;  [facility];  Service: Cardiovascular;  Laterality: N/A;  . MOUTH SURGERY     Root canal     Current Meds  Medication Sig  . acetaminophen (TYLENOL) 325 MG tablet Take 2 tablets (650 mg total) by mouth every 4 (four) hours as needed for headache or mild pain.  Marland Kitchen. aspirin 81 MG chewable tablet Chew 1 tablet (81 mg total) by mouth daily.  Marland Kitchen. atorvastatin (LIPITOR) 80 MG tablet Take 1 tablet (80 mg total) by mouth daily at 6 PM.  . lisinopril (PRINIVIL,ZESTRIL) 20 MG tablet Take 20 mg by mouth daily.  . metoprolol tartrate  (LOPRESSOR) 25 MG tablet Take 1 tablet (25 mg total) by mouth 2 (two) times daily.  . Multiple Vitamin (MULTIVITAMIN) tablet Take 1 tablet by mouth daily.  . nitroGLYCERIN (NITROSTAT) 0.4 MG SL tablet Place 1 tablet (0.4 mg total) under the tongue every 5 (five) minutes as needed for chest pain.  . ticagrelor (BRILINTA) 90 MG TABS tablet Take 1 tablet (90 mg total) by mouth 2 (two) times daily.     Allergies:   Patient has no known allergies.   Social History   Tobacco Use  . Smoking status: Former Smoker    Packs/day: 2.00    Years: 10.00    Pack years: 20.00    Types: Cigarettes  . Smokeless tobacco: Never Used  . Tobacco comment: Quti smoking 20 years ago  Substance Use Topics  . Alcohol use: Yes    Comment: occ  .  Drug use: No     Family Hx: The patient's family history includes Cancer in his mother; Heart disease (age of onset: 2) in his father; Hypertension in his father.  ROS:   Please see the history of present illness.     All other systems reviewed and are negative.   Prior CV studies:   The following studies were reviewed today:    Labs/Other Tests and Data Reviewed:    EKG:  An ECG dated 04/25/18 was personally reviewed today and demonstrated:  [facility] and is normal  Recent Labs: 04/23/2018: ALT 46; Magnesium 1.9; TSH 2.850 04/24/2018: BUN 10; Creatinine, Ser 0.84; Hemoglobin 14.8; Platelets 256; Potassium 4.1; Sodium 138   Recent Lipid Panel Lab Results  Component Value Date/Time   CHOL 180 04/23/2018 03:47 PM   TRIG 177 (H) 04/23/2018 03:47 PM   HDL 45 04/23/2018 03:47 PM   CHOLHDL 4.0 04/23/2018 03:47 PM   LDLCALC 100 (H) 04/23/2018 03:47 PM    Wt Readings from Last 3 Encounters:  05/07/18 (!) 339 lb (153.8 kg)  04/24/18 (!) 343 lb 6.4 oz (155.8 kg)  02/09/16 (!) 381 lb 9.6 oz (173.1 kg)     Objective:    Vital Signs:  Ht  (1.88 m)   Wt (!) 339 lb (153.8 kg)   BMI 43.53 kg/m    Constitutional, well-nourished well-developed in no  acute distress Vital signs reviewed Eyes, conjunctiva and sclera are normal without pallor or icterus extraocular motions intact and normal there is no lid lag Respiratory, normal effort and excursion no audible wheezing without a stethoscope Cardiovascular, no neck vein distention or peripheral edema Skin, no rash skin lesion or ulceration of the extremities Neurologic, cranial nerves II to XII are grossly intact and the patient moves all 4 extremities Neuro/Psychiatric, judgment and thought processes are intact and coherent, alert and oriented x3, mood and affect appear normal.  ASSESSMENT & PLAN:    1. Coronary artery disease presently stable continue current medical treatment including long-term dual antiplatelet therapy 1 year aspirin and Brilinta beta-blocker and high intensity statin. 2. Acute coronary syndrome, subsequent care stable and concern regarding his lipids I suspect he will require second agent also screen for lipoprotein a excess and check proBNP level with shortness of breath that appears to be disproportionate to Brilinta.  If significantly elevated would require repeat echocardiogram closely look at diastolic and systolic function and may need a diuretic. 3. Shortness of breath further evaluation proBNP level 4. Hypertension stable at target continue current treatment beta-blocker and ACE inhibitor with CAD 5. Check liver function lipid profile lipoprotein a and will require second agent if LDL is greater than 70.  COVID-19 Education: The signs and symptoms of COVID-19 were discussed with the patient and how to seek care for testing (follow up with PCP or arrange E-visit).  The importance of social distancing was discussed today.  Time:   Today, I have spent 28 minutes with the patient with telehealth technology discussing the above problems.     Medication Adjustments/Labs and Tests Ordered: Current medicines are reviewed at length with the patient today.  Concerns  regarding medicines are outlined above.   Tests Ordered: No orders of the defined types were placed in this encounter.   Medication Changes: No orders of the defined types were placed in this encounter.   Disposition:  Follow up in 2 week(s)  Signed, Norman Herrlich, MD  05/07/2018 9:52 AM    [facility]

## 2018-05-07 ENCOUNTER — Telehealth (INDEPENDENT_AMBULATORY_CARE_PROVIDER_SITE_OTHER): Payer: BLUE CROSS/BLUE SHIELD | Admitting: Cardiology

## 2018-05-07 ENCOUNTER — Encounter: Payer: Self-pay | Admitting: Cardiology

## 2018-05-07 ENCOUNTER — Other Ambulatory Visit: Payer: Self-pay

## 2018-05-07 VITALS — BP 133/65 | HR 70 | Ht 74.0 in | Wt 339.0 lb

## 2018-05-07 DIAGNOSIS — R0602 Shortness of breath: Secondary | ICD-10-CM

## 2018-05-07 DIAGNOSIS — I2510 Atherosclerotic heart disease of native coronary artery without angina pectoris: Secondary | ICD-10-CM

## 2018-05-07 DIAGNOSIS — I249 Acute ischemic heart disease, unspecified: Secondary | ICD-10-CM | POA: Diagnosis not present

## 2018-05-07 DIAGNOSIS — E785 Hyperlipidemia, unspecified: Secondary | ICD-10-CM

## 2018-05-07 DIAGNOSIS — I10 Essential (primary) hypertension: Secondary | ICD-10-CM

## 2018-05-07 NOTE — Patient Instructions (Addendum)
Medication Instructions:  Your physician recommends that you continue on your current medications as directed. Please refer to the Current Medication list given to you today.  If you need a refill on your cardiac medications before your next appointment, please call your pharmacy.   Lab work: Your physician recommends that you return for lab work during next office visit on Wednesday, 05/16/2018, at 10:30 am: ProBNP, lipid panel, lipoprotein A (LPa), CMP. Please fast beforehand.   If you have labs (blood work) drawn today and your tests are completely normal, you will receive your results only by: Marland Kitchen MyChart Message (if you have MyChart) OR . A paper copy in the mail If you have any lab test that is abnormal or we need to change your treatment, we will call you to review the results.  Testing/Procedures: You have been referred for cardiac rehab at [facility]. You will be contacted to schedule an appointment.   Follow-Up: At [facility], you and your health needs are our priority.  As part of our continuing mission to provide you with exceptional heart care, we have created designated Provider Care Teams.  These Care Teams include your primary Cardiologist (physician) and Advanced Practice Providers (APPs -  Physician Assistants and Nurse Practitioners) who all work together to provide you with the care you need, when you need it. You will need a follow up appointment in 1 weeks: Wednesday, 05/16/2018, at 10:30 am in the [place] office.

## 2018-05-07 NOTE — Addendum Note (Signed)
Addended by: Crist Fat on: 05/07/2018 10:27 AM   Modules accepted: Orders

## 2018-05-14 ENCOUNTER — Telehealth: Payer: Self-pay | Admitting: *Deleted

## 2018-05-14 NOTE — Telephone Encounter (Signed)
Pt wants a call back from nurse. Having a lot of shortness of breath and different kind of chest pains that move around. Shortness of breath X2.5 wks. Chest pain comes and goes, sharp. Sometimes on left side, sometimes on rt and sometimes in Left shoulder. Moves from side to side. Please advise.

## 2018-05-14 NOTE — Telephone Encounter (Signed)
Patient informed to keep follow up appointment as scheduled on Wednesday, 05/16/2018, at 10:30 am in the [place] office. Patient will come for lab work tomorrow morning, 05/15/2018. Patient is agreeable to plan and verbalized understanding. No further questions.

## 2018-05-14 NOTE — Telephone Encounter (Signed)
Keep appt on 05/16/18

## 2018-05-14 NOTE — Telephone Encounter (Signed)
Patient had a virtual visit on Monday, 05/07/2018, and is scheduled for an in office visit with lab work on Wednesday, 05/16/2018, at 10:30 am.   Patient called with complaints of shortness of breath on exertion that remains unchanged since virtual visit last week and intermittent chest/shoulder pain. Patient denies any chest pain at this time but reports 3 out of 10 left shoulder pain now. Patient states the pain will present in different areas when it comes and goes: chest, sides, and shoulders. Patient's BP now is 155/99 and HR is 55. He has not taken any nitroglycerin with reported symptoms.   Will have Dr. Dulce Sellar advise of any further recommendations before follow up appointment on Wednesday.

## 2018-05-15 NOTE — Progress Notes (Signed)
Cardiology Office Note:    Date:  05/16/2018   ID:  Brandon [name], DOB 1966/01/05, MRN [id number]  PCP:  Olive Bassough, Robert L, MD  Cardiologist:  Norman HerrlichBrian , MD    Referring MD: Olive Bassough, Robert L, MD    ASSESSMENT:    1. CAD in native artery   2. SOB (shortness of breath) on exertion   3. Essential hypertension   4. Mixed hyperlipidemia   5. Obesity, morbid, BMI 40.0-49.9 (HCC)    PLAN:    In order of problems listed above:  1. Shortness of breath likely multifactorial with an element of restrictive lung disease due to morbid obesity however it seems obvious he cannot tolerate Brilinta and he will transition a loading dose to Prasugrel.  proBNP level is normal. 2. CAD stable he will stay on dual antiplatelet therapy as above and will initiate cardiac rehabilitation that I think will be very beneficial for lifestyle body mass as well as movements.. 3. Hypertension stable continue current treatment 4. Hyperlipidemia not at target add a second agent Zetia if ineffective PCSK9 5. Morbid obesity long-term problem he has had bariatric surgery at the profound weight loss at this time pursue lifestyle modification   Next appointment: 4 weeks virtual   Medication Adjustments/Labs and Tests Ordered: Current medicines are reviewed at length with the patient today.  Concerns regarding medicines are outlined above.  No orders of the defined types were placed in this encounter.  No orders of the defined types were placed in this encounter.   Chief Complaint  Patient presents with  . Follow-up  . Coronary Artery Disease  . Hyperlipidemia  . Shortness of Breath    History of Present Illness:   Brandon PanderLyle Svec is a 53 y.o. male with with hypertension, hyperlipidemia  prediabetes and recent non STEMI and recent PCI and DES of mid RCA and OM1 04/23/18 last seen 05/07/18 with complaint of fatigue and exertional SOB. His residual LDL remains above target on a high intensity statin.+    04/23/18:   Conclusion     Mid RCA lesion is 95% stenosed.  Ost 1st Mrg to 1st Mrg lesion is 90% stenosed.  Prox LAD lesion is 35% stenosed.  A drug-eluting stent was successfully placed using a STENT RESOLUTE ONYX 3.5X15.  Post intervention, there is a 0% residual stenosis.  A drug-eluting stent was successfully placed using a STENT RESOLUTE ONYX [id number].  Post intervention, there is a 0% residual stenosis.   1.  Severe stenosis of the mid RCA, treated successfully with PCI using a drug-eluting stent (3.5 x 15 mm resolute Onyx DES) 2.  Severe stenosis of the first OM branch of the circumflex, treated successfully with PCI using a drug-eluting stent (2.75 x 34 mm resolute Onyx DES) 3.  Widely patent left main 4.  Mild nonobstructive disease of the LAD 5.  Normal LV systolic function    Coronary Diagrams  Diagnostic  Dominance: Right    Intervention        Overall he is not doing well he is awaiting to start cardiac rehabilitation he is away from work temporary disability he said that he finds himself a little bit tearful and anxious these days but he is particularly bothered by just persistent shortness of breath that in the end is due to Brilinta of him take his last dose this evening loading dose of Prasugrel tomorrow morning and the next day start maintenance dose.  He has residual elevated LDL on a high intensity statin will  add Zetia and does not achieve a target in the PCSK9.  His proBNP level is normal as no findings of heart failure and I will see him back in my office in 1 month. Compliance with diet, lifestyle and medications: Yes Past Medical History:  Diagnosis Date  . ACS (acute coronary syndrome) (HCC) 04/2018  . Acute coronary syndrome (HCC) 04/23/2018  . Arthritis    both knees  . Arthritis of knee, degenerative 05/14/2015  . CAD in native artery 05/06/2018  . Chronic right shoulder pain 05/28/2015  . Complication of anesthesia    " i HAVE A HARD TIME WAKING UP  "  . Coronary artery disease   . Essential hypertension 05/14/2015  . Hypertension   . Hypothyroidism 05/14/2015  . Mixed hyperlipidemia 05/14/2015  . Non-ST elevation (NSTEMI) myocardial infarction (HCC)   . Pre-diabetes 03/30/2016   2017: x/6.7 2018: 103/6.4  . Right elbow pain 05/28/2015    Past Surgical History:  Procedure Laterality Date  . CORONARY STENT INTERVENTION N/A 04/23/2018   Procedure: CORONARY STENT INTERVENTION;  Surgeon: Tonny Bollman, MD;  Location: [facility] INVASIVE CV LAB;  Service: Cardiovascular;  Laterality: N/A;  . EXCISION HAGLUND'S DEFORMITY WITH ACHILLES TENDON REPAIR Right 05/22/2014   Procedure: RIGHT ACHILLES DEBRIDEMENT; HAGLUND'S EXCISION ;  Surgeon: Toni Arthurs, MD;  Location: [facility];  Service: Orthopedics;  Laterality: Right;  . GASTROCNEMIUS RECESSION Right 05/22/2014   Procedure: GASTROC RECESSION ;  Surgeon: Toni Arthurs, MD;  Location: [facility];  Service: Orthopedics;  Laterality: Right;  . KNEE SURGERY     l/knee  . LAPAROSCOPIC GASTRIC SLEEVE RESECTION    . LEFT HEART CATH AND CORONARY ANGIOGRAPHY N/A 04/23/2018   Procedure: LEFT HEART CATH AND CORONARY ANGIOGRAPHY;  Surgeon: Tonny Bollman, MD;  Location: [facility] INVASIVE CV LAB;  Service: Cardiovascular;  Laterality: N/A;  . MOUTH SURGERY     Root canal    Current Medications: Current Meds  Medication Sig  . acetaminophen (TYLENOL) 325 MG tablet Take 2 tablets (650 mg total) by mouth every 4 (four) hours as needed for headache or mild pain.  Marland Kitchen aspirin 81 MG chewable tablet Chew 1 tablet (81 mg total) by mouth daily.  Marland Kitchen atorvastatin (LIPITOR) 80 MG tablet Take 1 tablet (80 mg total) by mouth daily at 6 PM.  . lisinopril (PRINIVIL,ZESTRIL) 20 MG tablet Take 20 mg by mouth daily.  . metoprolol tartrate (LOPRESSOR) 25 MG tablet Take 1 tablet (25 mg total) by mouth 2 (two) times daily.  . Multiple Vitamin (MULTIVITAMIN) tablet Take 1 tablet by mouth daily.  . nitroGLYCERIN  (NITROSTAT) 0.4 MG SL tablet Place 1 tablet (0.4 mg total) under the tongue every 5 (five) minutes as needed for chest pain.  . ticagrelor (BRILINTA) 90 MG TABS tablet Take 1 tablet (90 mg total) by mouth 2 (two) times daily.     Allergies:   Patient has no known allergies.   Social History   Socioeconomic History  . Marital status: Married    Spouse name: Not on file  . Number of children: Not on file  . Years of education: Not on file  . Highest education level: Not on file  Occupational History  . Not on file  Social Needs  . Financial resource strain: Not on file  . Food insecurity:    Worry: Not on file    Inability: Not on file  . Transportation needs:    Medical: Not on file    Non-medical:  Not on file  Tobacco Use  . Smoking status: Former Smoker    Packs/day: 2.00    Years: 10.00    Pack years: 20.00    Types: Cigarettes  . Smokeless tobacco: Never Used  . Tobacco comment: Quti smoking 20 years ago  Substance and Sexual Activity  . Alcohol use: Yes    Comment: occ  . Drug use: No  . Sexual activity: Yes  Lifestyle  . Physical activity:    Days per week: Not on file    Minutes per session: Not on file  . Stress: Not on file  Relationships  . Social connections:    Talks on phone: Not on file    Gets together: Not on file    Attends religious service: Not on file    Active member of club or organization: Not on file    Attends meetings of clubs or organizations: Not on file    Relationship status: Not on file  Other Topics Concern  . Not on file  Social History Narrative  . Not on file     Family History: The patient's family history includes Cancer in his mother; Heart disease (age of onset: 104) in his father; Hypertension in his father. ROS:   Please see the history of present illness.    All other systems reviewed and are negative.  EKGs/Labs/Other Studies Reviewed:    The following studies were reviewed today:  EKG:  EKG ordered today and  personally reviewed.  The ekg ordered today demonstrates sinus rhythm and is normal  Recent Labs: 04/23/2018: Magnesium 1.9; TSH 2.850 04/24/2018: Hemoglobin 14.8; Platelets 256 05/15/2018: ALT 44; BUN 12; Creatinine, Ser 0.96; NT-Pro BNP 95; Potassium 4.9; Sodium 138  Recent Lipid Panel    Component Value Date/Time   CHOL 177 05/15/2018 0822   TRIG 198 (H) 05/15/2018 0822   HDL 47 05/15/2018 0822   CHOLHDL 3.8 05/15/2018 0822   CHOLHDL 4.0 04/23/2018 1547   VLDL 35 04/23/2018 1547   LDLCALC 90 05/15/2018 0822    Physical Exam:    VS:  BP 138/86 (BP Location: Left Arm, Patient Position: Sitting, Cuff Size: Large)   Pulse 64   Ht 6\' 2"  (1.88 m)   Wt (!) 348 lb 9.6 oz (158.1 kg)   SpO2 98%   BMI 44.76 kg/m     Wt Readings from Last 3 Encounters:  05/16/18 (!) 348 lb 9.6 oz (158.1 kg)  05/07/18 (!) 339 lb (153.8 kg)  04/24/18 (!) 343 lb 6.4 oz (155.8 kg)     GEN: He has market obesity he had bariatric surgery one time his body weight exceeded 600 pounds well nourished, well developed in no acute distress HEENT: Normal NECK: No JVD; No carotid bruits LYMPHATICS: No lymphadenopathy CARDIAC: RRR, no murmurs, rubs, gallops RESPIRATORY:  Clear to auscultation without rales, wheezing or rhonchi  ABDOMEN: Soft, non-tender, non-distended MUSCULOSKELETAL:  No edema; No deformity  SKIN: Warm and dry NEUROLOGIC:  Alert and oriented x 3 PSYCHIATRIC:  Normal affect    Signed, Norman Herrlich, MD  05/16/2018 11:04 AM     [facility]

## 2018-05-16 ENCOUNTER — Telehealth: Payer: BLUE CROSS/BLUE SHIELD | Admitting: Cardiology

## 2018-05-16 ENCOUNTER — Ambulatory Visit (INDEPENDENT_AMBULATORY_CARE_PROVIDER_SITE_OTHER): Payer: BLUE CROSS/BLUE SHIELD | Admitting: Cardiology

## 2018-05-16 ENCOUNTER — Other Ambulatory Visit: Payer: Self-pay

## 2018-05-16 ENCOUNTER — Encounter: Payer: Self-pay | Admitting: Cardiology

## 2018-05-16 VITALS — BP 138/86 | HR 64 | Ht 74.0 in | Wt 348.6 lb

## 2018-05-16 DIAGNOSIS — I2510 Atherosclerotic heart disease of native coronary artery without angina pectoris: Secondary | ICD-10-CM | POA: Diagnosis not present

## 2018-05-16 DIAGNOSIS — R0602 Shortness of breath: Secondary | ICD-10-CM

## 2018-05-16 DIAGNOSIS — E782 Mixed hyperlipidemia: Secondary | ICD-10-CM

## 2018-05-16 DIAGNOSIS — I10 Essential (primary) hypertension: Secondary | ICD-10-CM | POA: Diagnosis not present

## 2018-05-16 HISTORY — DX: Shortness of breath: R06.02

## 2018-05-16 LAB — LIPOPROTEIN A (LPA): Lipoprotein (a): 23.3 nmol/L (ref <75.0)

## 2018-05-16 LAB — PRO B NATRIURETIC PEPTIDE: NT-Pro BNP: 95 pg/mL (ref 0–121)

## 2018-05-16 LAB — COMPREHENSIVE METABOLIC PANEL
ALT: 44 IU/L (ref 0–44)
AST: 27 IU/L (ref 0–40)
Albumin/Globulin Ratio: 2 (ref 1.2–2.2)
Albumin: 4.6 g/dL (ref 3.8–4.9)
Alkaline Phosphatase: 61 IU/L (ref 39–117)
BUN/Creatinine Ratio: 13 (ref 9–20)
BUN: 12 mg/dL (ref 6–24)
Bilirubin Total: 0.7 mg/dL (ref 0.0–1.2)
CO2: 23 mmol/L (ref 20–29)
Calcium: 10.2 mg/dL (ref 8.7–10.2)
Chloride: 100 mmol/L (ref 96–106)
Creatinine, Ser: 0.96 mg/dL (ref 0.76–1.27)
GFR calc Af Amer: 104 mL/min/{1.73_m2} (ref >59)
GFR calc non Af Amer: 90 mL/min/{1.73_m2} (ref >59)
Globulin, Total: 2.3 g/dL (ref 1.5–4.5)
Glucose: 113 mg/dL — ABNORMAL HIGH (ref 65–99)
Potassium: 4.9 mmol/L (ref 3.5–5.2)
Sodium: 138 mmol/L (ref 134–144)
Total Protein: 6.9 g/dL (ref 6.0–8.5)

## 2018-05-16 LAB — LIPID PANEL
Chol/HDL Ratio: 3.8 ratio (ref 0.0–5.0)
Cholesterol, Total: 177 mg/dL (ref 100–199)
HDL: 47 mg/dL (ref >39)
LDL Calculated: 90 mg/dL (ref 0–99)
Triglycerides: 198 mg/dL — ABNORMAL HIGH (ref 0–149)
VLDL Cholesterol Cal: 40 mg/dL (ref 5–40)

## 2018-05-16 MED ORDER — EZETIMIBE 10 MG PO TABS: 10.0000 mg | ORAL_TABLET | Freq: Every day | ORAL | 2 refills | Status: DC

## 2018-05-16 MED ORDER — PRASUGREL HCL 10 MG PO TABS: ORAL_TABLET | ORAL | 2 refills | Status: DC

## 2018-05-16 NOTE — Addendum Note (Signed)
Addended by: Fayrene Fearing B on: 05/16/2018 11:24 AM   Modules accepted: Orders

## 2018-05-16 NOTE — Addendum Note (Signed)
Addended by: Crist Fat on: 05/16/2018 05:08 PM   Modules accepted: Orders

## 2018-05-16 NOTE — Patient Instructions (Addendum)
Medication Instructions:  Your physician has recommended you make the following change in your medication:   STOP Brillinta after 05/16/2018 pm dose Start Prasugrel 60 mg ( 6 tabs) on 05/17/18 and 05/18/18 then take 10 mg (1 Tab) daily thereafter. Start: Zetia 10 mg (1 tab) daily  If you need a refill on your cardiac medications before your next appointment, please call your pharmacy.   Lab work: Your physician recommends that you return for lab work in 1 month after starting Zetia: lipid panel. No appointment needed, please fast beforehand. You can return to our [place] office for lab work.   If you have labs (blood work) drawn today and your tests are completely normal, you will receive your results only by: Marland Kitchen. MyChart Message (if you have MyChart) OR . A paper copy in the mail If you have any lab test that is abnormal or we need to change your treatment, we will call you to review the results.  Testing/Procedures: None  Follow-Up: At [facility], you and your health needs are our priority.  As part of our continuing mission to provide you with exceptional heart care, we have created designated Provider Care Teams.  These Care Teams include your primary Cardiologist (physician) and Advanced Practice Providers (APPs -  Physician Assistants and Nurse Practitioners) who all work together to provide you with the care you need, when you need it. [facility]: : June 5,2020 at 1030 Any Other Special Instructions Will Be Listed Below (If Applicable).  Prasugrel oral tablets What is this medicine? PRASUGREL (PRA soo grel) helps to prevent blood clots. This medicine is used to prevent heart attack, stroke, or other vascular events in people who are at high risk. This medicine may be used for other purposes; ask your health care provider or pharmacist if you have questions. COMMON BRAND NAME(S): Effient What should I tell my health care provider before I take this  medicine? They need to know if you have any of these conditions: -bleeding disorders -kidney disease -liver disease -recent trauma or surgery -stomach or intestinal ulcers -stroke or transient ischemic attack -an unusual or allergic reaction to prasugrel, other medicines, foods, dyes, or preservatives -pregnant or trying to get pregnant -breast-feeding How should I use this medicine? Take this medicine by mouth with a drink of water. Follow the directions on the prescription label. You may take this medicine with or without food. If it upsets your stomach, take it with food. Do not crush, cut, or chew the tablet. If you are not able to take the tablet whole, talk to your prescriber about other ways to take this medicine. Take your medicine at regular intervals. Do not take your medicine more often than directed. Do not stop taking except on your doctor's advice. Talk to your pediatrician regarding the use of this medicine in children. Special care may be needed. Overdosage: If you think you have taken too much of this medicine contact a poison control center or emergency room at once. NOTE: This medicine is only for you. Do not share this medicine with others. What if I miss a dose? If you miss a dose, take it as soon as you can. If it is almost time for your next dose, take only that dose. Do not take double or extra doses. What may interact with this medicine? Do not take this medicine with any of the following medications: -defibrotide This medicine may also interact with the following medications: -certain medicines that  treat or prevent blood clots like warfarin -narcotic medicines for pain -NSAIDS, medicines for pain and inflammation, like ibuprofen or naproxen This list may not describe all possible interactions. Give your health care provider a list of all the medicines, herbs, non-prescription drugs, or dietary supplements you use. Also tell them if you smoke, drink alcohol, or use  illegal drugs. Some items may interact with your medicine. What should I watch for while using this medicine? Visit your doctor or health care professional for regular check ups. Do not stop taking your medicine unless your doctor tells you to. Notify your doctor or health care professional and seek emergency treatment if you develop breathing problems; changes in vision; chest pain; severe, sudden headache; pain, swelling, warmth in the leg; trouble speaking; sudden numbness or weakness of the face, arm, or leg. These can be signs that your condition has gotten worse. If you are going to have surgery or dental work, tell your doctor or health care professional that you are taking this medicine. What side effects may I notice from receiving this medicine? Side effects that you should report to your doctor or health care professional as soon as possible: -allergic reactions like skin rash, itching or hives, swelling of the face, lips, or tongue -signs and symptoms of bleeding such as bloody or black, tarry stools; red or dark-brown urine; spitting up blood or brown material that looks like coffee grounds; red spots on the skin; unusual bruising or bleeding from the eye, gums, or nose Side effects that usually do not require medical attention (report to your doctor or health care professional if they continue or are bothersome): -diarrhea -headache -nausea, vomiting -pain in back, arms or legs This list may not describe all possible side effects. Call your doctor for medical advice about side effects. You may report side effects to FDA at 1-800-FDA-1088. Where should I keep my medicine? Keep out of the reach of children. Store at room temperature between 15 and 30 degrees C (59 and 86 degrees F). Keep this medicine in the original container. Keep container closed and do not remove the gray cylinder from the bottle. Throw away any unused medicine after the expiration date. NOTE: This sheet is a  summary. It may not cover all possible information. If you have questions about this medicine, talk to your doctor, pharmacist, or health care provider.  2019 Elsevier/Gold Standard (2017-04-17 17:07:32)  Ezetimibe Tablets What is this medicine? EZETIMIBE (ez ET i mibe) blocks the absorption of cholesterol from the stomach. It can help lower blood cholesterol for patients who are at risk of getting heart disease or a stroke. It is only for patients whose cholesterol level is not controlled by diet. This medicine may be used for other purposes; ask your health care provider or pharmacist if you have questions. COMMON BRAND NAME(S): Zetia What should I tell my health care provider before I take this medicine? They need to know if you have any of these conditions: -liver disease -an unusual or allergic reaction to ezetimibe, medicines, foods, dyes, or preservatives -pregnant or trying to get pregnant -breast-feeding How should I use this medicine? Take this medicine by mouth with a glass of water. Follow the directions on the prescription label. This medicine can be taken with or without food. Take your doses at regular intervals. Do not take your medicine more often than directed. Talk to your pediatrician regarding the use of this medicine in children. Special care may be needed. Overdosage: If you think  you have taken too much of this medicine contact a poison control center or emergency room at once. NOTE: This medicine is only for you. Do not share this medicine with others. What if I miss a dose? If you miss a dose, take it as soon as you can. If it is almost time for your next dose, take only that dose. Do not take double or extra doses. What may interact with this medicine? Do not take this medicine with any of the following medications: -fenofibrate -gemfibrozil This medicine may also interact with the following medications: -antacids -cyclosporine -herbal medicines like red yeast  rice -other medicines to lower cholesterol or triglycerides This list may not describe all possible interactions. Give your health care provider a list of all the medicines, herbs, non-prescription drugs, or dietary supplements you use. Also tell them if you smoke, drink alcohol, or use illegal drugs. Some items may interact with your medicine. What should I watch for while using this medicine? Visit your doctor or health care professional for regular checks on your progress. You will need to have your cholesterol levels checked. If you are also taking some other cholesterol medicines, you will also need to have tests to make sure your liver is working properly. Tell your doctor or health care professional if you get any unexplained muscle pain, tenderness, or weakness, especially if you also have a fever and tiredness. You need to follow a low-cholesterol, low-fat diet while you are taking this medicine. This will decrease your risk of getting heart and blood vessel disease. Exercising and avoiding alcohol and smoking can also help. Ask your doctor or dietician for advice. What side effects may I notice from receiving this medicine? Side effects that you should report to your doctor or health care professional as soon as possible: -allergic reactions like skin rash, itching or hives, swelling of the face, lips, or tongue -dark yellow or brown urine -unusually weak or tired -yellowing of the skin or eyes Side effects that usually do not require medical attention (report to your doctor or health care professional if they continue or are bothersome): -diarrhea -dizziness -headache -stomach upset or pain This list may not describe all possible side effects. Call your doctor for medical advice about side effects. You may report side effects to FDA at 1-800-FDA-1088. Where should I keep my medicine? Keep out of the reach of children. Store at room temperature between 15 and 30 degrees C (59 and 86  degrees F). Protect from moisture. Keep container tightly closed. Throw away any unused medicine after the expiration date. NOTE: This sheet is a summary. It may not cover all possible information. If you have questions about this medicine, talk to your doctor, pharmacist, or health care provider.  2019 Elsevier/Gold Standard (2011-07-04 15:39:09)

## 2018-05-30 ENCOUNTER — Telehealth: Payer: Self-pay | Admitting: *Deleted

## 2018-05-30 ENCOUNTER — Encounter: Payer: Self-pay | Admitting: Family

## 2018-05-30 MED ORDER — METOPROLOL TARTRATE 25 MG PO TABS: 25.0000 mg | ORAL_TABLET | Freq: Two times a day (BID) | ORAL | 1 refills | Status: DC

## 2018-05-30 MED ORDER — ATORVASTATIN CALCIUM 80 MG PO TABS: 80.0000 mg | ORAL_TABLET | Freq: Every day | ORAL | 1 refills | Status: DC

## 2018-05-30 NOTE — Telephone Encounter (Signed)
Pt is calling to get a letter from Dr. Dulce Sellar saying that it is okay for him to get dental work done. Form is in [facility] office and will be given to Dr. Dulce Sellar to look at and sign since Dr. Tomie China didn't feel comfortable signing it. Tried to call pt back about this but unable to reach at home will leave msg on mobile.

## 2018-05-30 NOTE — Progress Notes (Signed)
Received request for clearance for "tooth extraction" by Dr. Joaquin Courts. See attached letter. Faxed to Dr. Sherrell Puller' office.   Brandon Sorrow, NP

## 2018-05-30 NOTE — Telephone Encounter (Signed)
Rx refill sent to pharmacy.  *STAT* If patient is at the pharmacy, call can be transferred to refill team.   1. Which medications need to be refilled? (please list name of each medication and dose if known) Atorvastatin and Metoprolol  2. Which pharmacy/location (including street and city if local pharmacy) is medication to be sent to?[facility] on [address] 3. Do they need a 30 day or 90 day supply? 90

## 2018-06-11 ENCOUNTER — Telehealth: Payer: Self-pay | Admitting: *Deleted

## 2018-06-11 NOTE — Telephone Encounter (Signed)
Pt is calling to let office manager pt gave forms to [facility] for [facility] 4/28 and sent back to Dr. For sig on 5/5. [facility] says they still don't have it. Pt is upset that this is not done yet. Please call back and let him know what is going on. [phone]

## 2018-06-12 LAB — LIPID PANEL
Chol/HDL Ratio: 2.8 ratio (ref 0.0–5.0)
Cholesterol, Total: 124 mg/dL (ref 100–199)
HDL: 44 mg/dL (ref >39)
LDL Calculated: 53 mg/dL (ref 0–99)
Triglycerides: 134 mg/dL (ref 0–149)
VLDL Cholesterol Cal: 27 mg/dL (ref 5–40)

## 2018-06-15 ENCOUNTER — Encounter: Payer: Self-pay | Admitting: *Deleted

## 2018-06-15 ENCOUNTER — Encounter: Payer: Self-pay | Admitting: Cardiology

## 2018-06-15 ENCOUNTER — Other Ambulatory Visit: Payer: Self-pay

## 2018-06-15 ENCOUNTER — Telehealth (INDEPENDENT_AMBULATORY_CARE_PROVIDER_SITE_OTHER): Payer: BLUE CROSS/BLUE SHIELD | Admitting: Cardiology

## 2018-06-15 VITALS — BP 130/76 | HR 73 | Ht 74.0 in | Wt 350.0 lb

## 2018-06-15 DIAGNOSIS — I10 Essential (primary) hypertension: Secondary | ICD-10-CM

## 2018-06-15 DIAGNOSIS — R0602 Shortness of breath: Secondary | ICD-10-CM

## 2018-06-15 DIAGNOSIS — I2510 Atherosclerotic heart disease of native coronary artery without angina pectoris: Secondary | ICD-10-CM

## 2018-06-15 DIAGNOSIS — E782 Mixed hyperlipidemia: Secondary | ICD-10-CM

## 2018-06-15 DIAGNOSIS — R079 Chest pain, unspecified: Secondary | ICD-10-CM

## 2018-06-15 NOTE — Progress Notes (Signed)
Virtual Visit via Video Note   This visit type was conducted due to national recommendations for restrictions regarding the COVID-19 Pandemic (e.g. social distancing) in an effort to limit this patient's exposure and mitigate transmission in our community.  Due to his co-morbid illnesses, this patient is at least at moderate risk for complications without adequate follow up.  This format is felt to be most appropriate for this patient at this time.  All issues noted in this document were discussed and addressed.  A limited physical exam was performed with this format.  Please refer to the patient's chart for his consent to telehealth for [facility].   Date:  06/15/2018   ID:  Brandon [name] Kirkendoll, DOB 19-May-1965, MRN [id number]  Patient Location: Home Provider Location: Office  PCP:  Olive Bassough, Robert L, MD  Cardiologist:  Norman HerrlichBrian Keniesha Adderly, MD  Electrophysiologist:  None   Evaluation Performed:  Follow-Up Visit  Chief Complaint:  53 yo male with recent NSTEMI and DES to mid RCA and OM1 presents for 4 week follow up after transition from Brilinta to Prasugrel.   History of Present Illness:    Brandon [name] Bogdanski is a 53 y.o. male with with hypertension, hyperlipidemia  prediabetes and recent non STEMI and recent PCI and DES of mid RCA and OM1 04/23/18 last seen 05/16/18.  Reports SOB is nearly completely resolved after stopping Brilinta. Has been working out with cardiac rehab program and feels much better than when we last spoke.   He reports "low blood pressures". BP this morning 110/70. Normal SBP 110-120. Denies SBP <100. No dizziness, lightheadedness, no syncope or near-syncope. As he is not symptomatic would not alter BP medications.   Endorses chest discomfort that has occurred since his cardiac cath. Reports it is different than his anginal equivalent which radiated to his L hsoulder. Describes as dull and mobile pain under R chest, under L chest. Discomfort is non-limiting. Describes as a little  "irritating". Notices it while eating but reports not consistent with previous experience of GERD. Reports occurs everyday and pain level 1-2 out of 10 on pain scale.   The patient does not have symptoms concerning for COVID-19 infection (fever, chills, cough, or new shortness of breath).    Past Medical History:  Diagnosis Date  . ACS (acute coronary syndrome) (HCC) 04/2018  . Acute coronary syndrome (HCC) 04/23/2018  . Arthritis    both knees  . Arthritis of knee, degenerative 05/14/2015  . CAD in native artery 05/06/2018  . Chronic right shoulder pain 05/28/2015  . Complication of anesthesia    " i HAVE A HARD TIME WAKING UP "  . Coronary artery disease   . Essential hypertension 05/14/2015  . Hypertension   . Hypothyroidism 05/14/2015  . Mixed hyperlipidemia 05/14/2015  . Non-ST elevation (NSTEMI) myocardial infarction (HCC)   . Pre-diabetes 03/30/2016   2017: x/6.7 2018: 103/6.4  . Right elbow pain 05/28/2015   Past Surgical History:  Procedure Laterality Date  . CORONARY STENT INTERVENTION N/A 04/23/2018   Procedure: CORONARY STENT INTERVENTION;  Surgeon: Tonny Bollmanooper, Michael, MD;  [facility];  Service: Cardiovascular;  Laterality: N/A;  . EXCISION HAGLUND'S DEFORMITY WITH ACHILLES TENDON REPAIR Right 05/22/2014   Procedure: RIGHT ACHILLES DEBRIDEMENT; HAGLUND'S EXCISION ;  Surgeon: Toni ArthursJohn Hewitt, MD;  Location: [facility];  Service: Orthopedics;  Laterality: Right;  . GASTROCNEMIUS RECESSION Right 05/22/2014   Procedure: GASTROC RECESSION ;  Surgeon: Toni ArthursJohn Hewitt, MD;  Location:  SURGERY CENTER;  Service:  Orthopedics;  Laterality: Right;  . KNEE SURGERY     l/knee  . LAPAROSCOPIC GASTRIC SLEEVE RESECTION    . LEFT HEART CATH AND CORONARY ANGIOGRAPHY N/A 04/23/2018   Procedure: LEFT HEART CATH AND CORONARY ANGIOGRAPHY;  Surgeon: Tonny Bollman, MD;  Location: [facility] INVASIVE CV LAB;  Service: Cardiovascular;  Laterality: N/A;  . MOUTH SURGERY     Root  canal     No outpatient medications have been marked as taking for the 06/15/18 encounter (Appointment) with Baldo Daub, MD.     Allergies:   Patient has no known allergies.   Social History   Tobacco Use  . Smoking status: Former Smoker    Packs/day: 2.00    Years: 10.00    Pack years: 20.00    Types: Cigarettes  . Smokeless tobacco: Never Used  . Tobacco comment: Quti smoking 20 years ago  Substance Use Topics  . Alcohol use: Yes    Comment: occ  . Drug use: No     Family Hx: The patient's family history includes Cancer in his mother; Heart disease (age of onset: 73) in his father; Hypertension in his father.  ROS:   Please see the history of present illness.     All other systems reviewed and are negative.   Prior CV studies:   The following studies were reviewed today:  04/23/18 cardiac cath: 04/23/18:   Conclusion     Mid RCA lesion is 95% stenosed.  Ost 1st Mrg to 1st Mrg lesion is 90% stenosed.  Prox LAD lesion is 35% stenosed.  A drug-eluting stent was successfully placed using a STENT RESOLUTE ONYX 3.5X15.  Post intervention, there is a 0% residual stenosis.  A drug-eluting stent was successfully placed using a STENT RESOLUTE ONYX [id number].  Post intervention, there is a 0% residual stenosis.   1.  Severe stenosis of the mid RCA, treated successfully with PCI using a drug-eluting stent (3.5 x 15 mm resolute Onyx DES) 2.  Severe stenosis of the first OM branch of the circumflex, treated successfully with PCI using a drug-eluting stent (2.75 x 34 mm resolute Onyx DES) 3.  Widely patent left main 4.  Mild nonobstructive disease of the LAD 5.  Normal LV systolic function    Coronary Diagrams  Diagnostic  Dominance: Right    Intervention         Labs/Other Tests and Data Reviewed:    EKG:  No ECG reviewed.  Recent Labs: 04/23/2018: Magnesium 1.9; TSH 2.850 04/24/2018: Hemoglobin 14.8; Platelets 256 05/15/2018: ALT 44; BUN 12; Creatinine,  Ser 0.96; NT-Pro BNP 95; Potassium 4.9; Sodium 138   Recent Lipid Panel Lab Results  Component Value Date/Time   CHOL 124 06/12/2018 08:35 AM   TRIG 134 06/12/2018 08:35 AM   HDL 44 06/12/2018 08:35 AM   CHOLHDL 2.8 06/12/2018 08:35 AM   CHOLHDL 4.0 04/23/2018 03:47 PM   LDLCALC 53 06/12/2018 08:35 AM    Wt Readings from Last 3 Encounters:  05/16/18 (!) 348 lb 9.6 oz (158.1 kg)  05/07/18 (!) 339 lb (153.8 kg)  04/24/18 (!) 343 lb 6.4 oz (155.8 kg)     Objective:    Vital Signs:  There were no vitals taken for this visit.   VITAL SIGNS:  reviewed GEN:  no acute distress RESPIRATORY:  normal respiratory effort, symmetric expansion PSYCH:  normal affect  ASSESSMENT & PLAN:    1. CAD - Participating in cardiac rehab and reports improved exercise tolerance. Endorses daily dull  chest pain that is mobile and rates 1-2 out of 10 occurs at rest and with activity. States it feels different than his anginal equivalent which radiated to his L shoulder. While it occurs during mealtimes, reports it feels different than previous GERD. Consider etiology of recurrent coronary blockage, coronary artery spasm, GERD, musculoskeletal.  In general he has been doing well participates in cardiac rehabilitation but continues to have vague recurring chest discomfort as opposed to referral to coronary angiography will undergo myocardial perfusion study in our office.  If evidence of ischemia would benefit from redo coronary angiography.  Lexiscan Myoview for chest discomfort with typical and atypical anginal features. If negative consider assessment over time or addition of Ranexa.    Continue GDMT of aspirin, statin, beta blocker.  DAPT of aspirin and effient as he was intolerant of brilinta. Continue for at least one year - start date 04/23/18.  His shortness of breath is resolved after Brilinta 2. SOB - Resolved after transition from Brilinta to Prasugrel. Brilinta added to allergy list.  3. HTN - Well  controlled with SBP 110-120. He was concerned these were "low" - denies dizziness, lightheadedness, syncope. As he is not symptomatic would continue same antihypertensive regimen. Education was provided to report new symptoms or SBP <100.   4. HLD - Initiated Zetia 05/16/18 in addition to high intensity statin. Recheck Lipid panel at in office visit in 6 weeks. Consider PCSK9 if LDL remains >70.    COVID-19 Education: The signs and symptoms of COVID-19 were discussed with the patient and how to seek care for testing (follow up with PCP or arrange E-visit).  The importance of social distancing was discussed today.  Time:   Today, I have spent 25 minutes with the patient with telehealth technology discussing the above problems.     Medication Adjustments/Labs and Tests Ordered: Current medicines are reviewed at length with the patient today.  Concerns regarding medicines are outlined above.   Tests Ordered: No orders of the defined types were placed in this encounter.   Medication Changes: No orders of the defined types were placed in this encounter.   Disposition:  Follow up in 6 week(s) in office.  Signed, Norman Herrlich, MD  06/15/2018 8:01 AM    [facility]

## 2018-06-15 NOTE — Patient Instructions (Signed)
Medication Instructions:  Your physician recommends that you continue on your current medications as directed. Please refer to the Current Medication list given to you today.  If you need a refill on your cardiac medications before your next appointment, please call your pharmacy.   Lab work: None If you have labs (blood work) drawn today and your tests are completely normal, you will receive your results only by: Marland Kitchen MyChart Message (if you have MyChart) OR . A paper copy in the mail If you have any lab test that is abnormal or we need to change your treatment, we will call you to review the results.  Testing/Procedures: Your physician has requested that you have a lexiscan myoview. For further information please visit [email or web address] Please follow instruction sheet, as given.    [facility] Nuclear Imaging [address] [place], [place] [address] Phone:  [phone]  June 15, 2018    Brandon [name] DOB: October 30, 1965 MRN: [id number] [address] [place] [address]   Dear Brandon [name],  You are scheduled for a 2 day Myocardial Perfusion Imaging Study on:  July 17 2018 at 1100 and July 8 at 1100.  Please arrive 15 minutes prior to your appointment time for registration and insurance purposes.  The test will take approximately 3 to 4 hours to complete; you may bring reading material.  If someone comes with you to your appointment, they will need to remain in the main lobby due to limited space in the testing area. **If you are pregnant or breastfeeding, please notify the nuclear lab prior to your appointment**  How to prepare for your Myocardial Perfusion Test: . Do not eat or drink 3 hours prior to your test, except you may have water. . Do not consume products containing caffeine (regular or decaffeinated) 12 hours prior to your test. (ex: coffee, chocolate, sodas, tea). Do bring a list of your current medications with you.  If not listed below, you may take your  medications as normal. . Do not take metoprolol (Lopressor, Toprol) for 24 hours prior to the test.  Bring the medication to your appointment as you may be required to take it once the test is complete. . Do wear comfortable clothes (no dresses or overalls) and walking shoes, tennis shoes preferred (No heels or open toe shoes are allowed). . Do NOT wear cologne, perfume, aftershave, or lotions (deodorant is allowed). . If these instructions are not followed, your test will have to be rescheduled.  Please report to [address] for your test.  If you have questions or concerns about your appointment, you can call the [facility] Lab at [phone].  If you cannot keep your appointment, please provide 24 hours notification to the Nuclear Lab, to avoid a possible $50 charge to your account.    Follow-Up: At [facility], you and your health needs are our priority.  As part of our continuing mission to provide you with exceptional heart care, we have created designated Provider Care Teams.  These Care Teams include your primary Cardiologist (physician) and Advanced Practice Providers (APPs -  Physician Assistants and Nurse Practitioners) who all work together to provide you with the care you need, when you need it. You will need a follow up appointment in 6 weeks. Any Other Special Instructions Will Be Listed Below (If Applicable).

## 2018-06-19 ENCOUNTER — Telehealth: Payer: Self-pay | Admitting: Cardiology

## 2018-06-19 NOTE — Telephone Encounter (Signed)
Patient's daughter tested positive for Covid19 Saturday 06/16/2018. The daughter does not live with him although they had been around her and watching her kids prior to Saturday.  Patient said he was told by the health department he needed to let his cardiologist know his daughter's positive results and to find out if there is anything special he needs to do.  Also, health department said he could get tested as early as Saturday and wants to if [facility] thinks he should be tested.  Please call patient at [phone]

## 2018-06-19 NOTE — Telephone Encounter (Signed)
Patient aware that he should contact Dr Garlon Hatchet for them to do Covid testing in their office.  Patient agreed to plan and verbalized understanding.

## 2018-06-19 NOTE — Telephone Encounter (Signed)
He should be seen by Dr. Arna Medici office his PCP they do COVID testing there as part of [facility]

## 2018-06-19 NOTE — Telephone Encounter (Signed)
I'm sorry, he can tested as early as Wednesday 06/20/2018

## 2018-06-23 ENCOUNTER — Observation Stay (HOSPITAL_BASED_OUTPATIENT_CLINIC_OR_DEPARTMENT_OTHER)
Admission: EM | Admit: 2018-06-23 | Discharge: 2018-06-24 | Disposition: A | Discharge status: Home / Self Care | Payer: BC Managed Care – PPO | Attending: Family Medicine | Admitting: Family Medicine

## 2018-06-23 ENCOUNTER — Emergency Department (HOSPITAL_BASED_OUTPATIENT_CLINIC_OR_DEPARTMENT_OTHER): Payer: BC Managed Care – PPO

## 2018-06-23 ENCOUNTER — Encounter (HOSPITAL_BASED_OUTPATIENT_CLINIC_OR_DEPARTMENT_OTHER): Payer: Self-pay | Admitting: Emergency Medicine

## 2018-06-23 ENCOUNTER — Other Ambulatory Visit: Payer: Self-pay

## 2018-06-23 DIAGNOSIS — R079 Chest pain, unspecified: Secondary | ICD-10-CM | POA: Diagnosis present

## 2018-06-23 DIAGNOSIS — Z7982 Long term (current) use of aspirin: Secondary | ICD-10-CM | POA: Diagnosis not present

## 2018-06-23 DIAGNOSIS — R0609 Other forms of dyspnea: Secondary | ICD-10-CM | POA: Insufficient documentation

## 2018-06-23 DIAGNOSIS — G4730 Sleep apnea, unspecified: Secondary | ICD-10-CM | POA: Insufficient documentation

## 2018-06-23 DIAGNOSIS — Z8249 Family history of ischemic heart disease and other diseases of the circulatory system: Secondary | ICD-10-CM | POA: Insufficient documentation

## 2018-06-23 DIAGNOSIS — Z955 Presence of coronary angioplasty implant and graft: Secondary | ICD-10-CM | POA: Diagnosis not present

## 2018-06-23 DIAGNOSIS — R072 Precordial pain: Secondary | ICD-10-CM | POA: Diagnosis not present

## 2018-06-23 DIAGNOSIS — I10 Essential (primary) hypertension: Secondary | ICD-10-CM | POA: Diagnosis not present

## 2018-06-23 DIAGNOSIS — Z87891 Personal history of nicotine dependence: Secondary | ICD-10-CM | POA: Diagnosis not present

## 2018-06-23 DIAGNOSIS — I2510 Atherosclerotic heart disease of native coronary artery without angina pectoris: Secondary | ICD-10-CM | POA: Diagnosis present

## 2018-06-23 DIAGNOSIS — Z79899 Other long term (current) drug therapy: Secondary | ICD-10-CM | POA: Diagnosis not present

## 2018-06-23 DIAGNOSIS — E782 Mixed hyperlipidemia: Secondary | ICD-10-CM | POA: Diagnosis not present

## 2018-06-23 DIAGNOSIS — Z20828 Contact with and (suspected) exposure to other viral communicable diseases: Secondary | ICD-10-CM | POA: Insufficient documentation

## 2018-06-23 DIAGNOSIS — E669 Obesity, unspecified: Secondary | ICD-10-CM | POA: Insufficient documentation

## 2018-06-23 DIAGNOSIS — R0789 Other chest pain: Secondary | ICD-10-CM

## 2018-06-23 DIAGNOSIS — I214 Non-ST elevation (NSTEMI) myocardial infarction: Secondary | ICD-10-CM

## 2018-06-23 DIAGNOSIS — Z888 Allergy status to other drugs, medicaments and biological substances status: Secondary | ICD-10-CM | POA: Diagnosis not present

## 2018-06-23 DIAGNOSIS — Z7902 Long term (current) use of antithrombotics/antiplatelets: Secondary | ICD-10-CM | POA: Diagnosis not present

## 2018-06-23 DIAGNOSIS — R05 Cough: Secondary | ICD-10-CM | POA: Diagnosis not present

## 2018-06-23 DIAGNOSIS — I252 Old myocardial infarction: Secondary | ICD-10-CM | POA: Diagnosis not present

## 2018-06-23 DIAGNOSIS — Z6841 Body Mass Index (BMI) 40.0 and over, adult: Secondary | ICD-10-CM | POA: Diagnosis not present

## 2018-06-23 HISTORY — DX: Chest pain, unspecified: R07.9

## 2018-06-23 LAB — CBC
HCT: 47.6 % (ref 39.0–52.0)
Hemoglobin: 15.5 g/dL (ref 13.0–17.0)
MCH: 31.5 pg (ref 26.0–34.0)
MCHC: 32.6 g/dL (ref 30.0–36.0)
MCV: 96.7 fL (ref 80.0–100.0)
Platelets: 252 10*3/uL (ref 150–400)
RBC: 4.92 MIL/uL (ref 4.22–5.81)
RDW: 13.2 % (ref 11.5–15.5)
WBC: 6.5 10*3/uL (ref 4.0–10.5)
nRBC: 0 % (ref 0.0–0.2)

## 2018-06-23 LAB — BASIC METABOLIC PANEL
Anion gap: 7 (ref 5–15)
BUN: 16 mg/dL (ref 6–20)
CO2: 26 mmol/L (ref 22–32)
Calcium: 9.2 mg/dL (ref 8.9–10.3)
Chloride: 104 mmol/L (ref 98–111)
Creatinine, Ser: 1.06 mg/dL (ref 0.61–1.24)
GFR calc Af Amer: >60 mL/min (ref >60)
GFR calc non Af Amer: >60 mL/min (ref >60)
Glucose, Bld: 132 mg/dL — ABNORMAL HIGH (ref 70–99)
Potassium: 4.2 mmol/L (ref 3.5–5.1)
Sodium: 137 mmol/L (ref 135–145)

## 2018-06-23 LAB — SARS CORONAVIRUS 2 AG (30 MIN TAT): SARS Coronavirus 2 Ag: NEGATIVE

## 2018-06-23 LAB — SARS CORONAVIRUS 2: SARS Coronavirus 2: NOT DETECTED

## 2018-06-23 LAB — TROPONIN I
Troponin I: <0.03 ng/mL (ref <0.03)
Troponin I: <0.03 ng/mL (ref <0.03)

## 2018-06-23 MED ORDER — ATORVASTATIN CALCIUM 80 MG PO TABS
80.0000 mg | ORAL_TABLET | Freq: Every day | ORAL | Status: DC
  Administered 2018-06-23 – 2018-06-24 (×2): 80 mg via ORAL
  Filled 2018-06-23 (×2): qty 1

## 2018-06-23 MED ORDER — ONDANSETRON HCL 4 MG/2ML IJ SOLN: 4.0000 mg | Freq: Four times a day (QID) | INTRAMUSCULAR | Status: DC | PRN

## 2018-06-23 MED ORDER — ASPIRIN 81 MG PO CHEW
81.0000 mg | CHEWABLE_TABLET | Freq: Every day | ORAL | Status: DC
  Administered 2018-06-24: 81 mg via ORAL
  Filled 2018-06-23: qty 1

## 2018-06-23 MED ORDER — PRASUGREL HCL 10 MG PO TABS
10.0000 mg | ORAL_TABLET | Freq: Every day | ORAL | Status: DC
  Administered 2018-06-24: 10 mg via ORAL
  Filled 2018-06-23: qty 1

## 2018-06-23 MED ORDER — ASPIRIN 81 MG PO CHEW
324.0000 mg | CHEWABLE_TABLET | Freq: Once | ORAL | Status: AC
  Administered 2018-06-23: 324 mg via ORAL
  Filled 2018-06-23: qty 4

## 2018-06-23 MED ORDER — ACETAMINOPHEN 325 MG PO TABS: 650.0000 mg | ORAL_TABLET | ORAL | Status: DC | PRN

## 2018-06-23 MED ORDER — METOPROLOL TARTRATE 25 MG PO TABS
25.0000 mg | ORAL_TABLET | Freq: Two times a day (BID) | ORAL | Status: DC
  Administered 2018-06-23 – 2018-06-24 (×2): 25 mg via ORAL
  Filled 2018-06-23 (×2): qty 1

## 2018-06-23 MED ORDER — ENALAPRIL MALEATE 10 MG PO TABS
10.0000 mg | ORAL_TABLET | Freq: Every day | ORAL | Status: DC
  Administered 2018-06-24: 10 mg via ORAL
  Filled 2018-06-23: qty 1

## 2018-06-23 MED ORDER — ENOXAPARIN SODIUM 40 MG/0.4ML ~~LOC~~ SOLN
40.0000 mg | SUBCUTANEOUS | Status: DC
  Administered 2018-06-23 – 2018-06-24 (×2): 40 mg via SUBCUTANEOUS
  Filled 2018-06-23 (×2): qty 0.4

## 2018-06-23 MED ORDER — SODIUM CHLORIDE 0.9% FLUSH
3.0000 mL | Freq: Once | INTRAVENOUS | Status: DC
  Filled 2018-06-23: qty 3

## 2018-06-23 MED ORDER — EZETIMIBE 10 MG PO TABS
10.0000 mg | ORAL_TABLET | Freq: Every evening | ORAL | Status: DC
  Administered 2018-06-23 – 2018-06-24 (×2): 10 mg via ORAL
  Filled 2018-06-23 (×2): qty 1

## 2018-06-23 NOTE — Consult Note (Signed)
Cardiology Consultation:   Patient ID: Brandon [name]; [id number]; [birth year]   Admit date: 06/23/2018 Date of Consult: 06/23/2018  Primary Care Provider: Olive Bassough, Robert L, MD Primary Cardiologist: Norman HerrlichBrian Munley, MD Primary Electrophysiologist:  None  Chief Complaint: chest pain  Patient Profile:   53 year old man with history of hypertension, hyperlipidemia, obesity, sleep apnea, and coronary disease status post PCI and April of this year who presents with exertional chest pain and dyspnea.  History of Present Illness:   In April the patient presented with chest [facility] and was found to have NSTEMI with troponin of 23.  Was transferred to [place] and underwent coronary angiography which demonstrated 95% mid RCA, 90% OM1, and mild nonobstructive disease in the LAD.  He underwent PCI to the RCA and OM branch.  LV function was normal.  Since his MI, he has had mild chest pain with exertion which he rates 2 out of 10 in severity.  He was scheduled to undergo an outpatient stress test for this.  On Thursday he began doing work outside on a bicycle.  With little exertion he felt short of breath and dizzy.  He also felt that his chest pain was somewhat worse.  Today he had a worsening episode of chest pain and also had symptoms in his left arm, and that prompted him to him to the emergency room.  Vital signs in the emergency room are unremarkable.  Labs notable for an undetectable troponin and normal metabolic panel and CBC.  Repeat troponin also undetectable.  EKG was sinus rhythm without evidence of ischemia.  He had no chest pain at rest while in the emergency room.  He was admitted to medicine for further evaluation.  Of note his daughter is positive for coronavirus, so he is being ruled out.  Initial point-of-care test was negative, but is being repeated.    Past Medical History:  Diagnosis Date  . ACS (acute coronary syndrome) (HCC) 04/2018  . Acute coronary syndrome  (HCC) 04/23/2018  . Arthritis    both knees  . Arthritis of knee, degenerative 05/14/2015  . CAD in native artery 05/06/2018  . Chronic right shoulder pain 05/28/2015  . Complication of anesthesia    " i HAVE A HARD TIME WAKING UP "  . Coronary artery disease   . Essential hypertension 05/14/2015  . Hypertension   . Mixed hyperlipidemia 05/14/2015  . Non-ST elevation (NSTEMI) myocardial infarction (HCC)   . Pre-diabetes 03/30/2016   2017: x/6.7 2018: 103/6.4  . Right elbow pain 05/28/2015    Past Surgical History:  Procedure Laterality Date  . CORONARY STENT INTERVENTION N/A 04/23/2018   Procedure: CORONARY STENT INTERVENTION;  Surgeon: Tonny Bollmanooper, Michael, MD;  [facility];  Service: Cardiovascular;  Laterality: N/A;  . EXCISION HAGLUND'S DEFORMITY WITH ACHILLES TENDON REPAIR Right 05/22/2014   Procedure: RIGHT ACHILLES DEBRIDEMENT; HAGLUND'S EXCISION ;  Surgeon: Toni ArthursJohn Hewitt, MD;  Location: [facility];  Service: Orthopedics;  Laterality: Right;  . GASTROCNEMIUS RECESSION Right 05/22/2014   Procedure: GASTROC RECESSION ;  Surgeon: Toni ArthursJohn Hewitt, MD;  Location: [facility];  Service: Orthopedics;  Laterality: Right;  . KNEE SURGERY     l/knee  . LAPAROSCOPIC GASTRIC SLEEVE RESECTION    . LEFT HEART CATH AND CORONARY ANGIOGRAPHY N/A 04/23/2018   Procedure: LEFT HEART CATH AND CORONARY ANGIOGRAPHY;  Surgeon: Tonny Bollmanooper, Michael, MD;  [facility];  Service: Cardiovascular;  Laterality: N/A;  . MOUTH SURGERY  Root canal     Inpatient Medications: Scheduled Meds: . [START ON 06/24/2018] aspirin  81 mg Oral Daily  . atorvastatin  80 mg Oral q1800  . [START ON 06/24/2018] enalapril  10 mg Oral Daily  . enoxaparin (LOVENOX) injection  40 mg Subcutaneous Q24H  . ezetimibe  10 mg Oral QPM  . metoprolol tartrate  25 mg Oral BID  . [START ON 06/24/2018] prasugrel  10 mg Oral Daily   Continuous Infusions:  PRN Meds: acetaminophen, ondansetron  (ZOFRAN) IV  Home Meds: Prior to Admission medications   Medication Sig Start Date End Date Taking? Authorizing Provider  aspirin 81 MG chewable tablet Chew 1 tablet (81 mg total) by mouth daily. 04/24/18  Yes Barrett, Evelene Croon, PA-C  atorvastatin (LIPITOR) 80 MG tablet Take 1 tablet (80 mg total) by mouth daily at 6 PM. 05/30/18  Yes Munley, Hilton Cork, MD  enalapril (VASOTEC) 10 MG tablet Take 10 mg by mouth daily.   Yes [provider]  ezetimibe (ZETIA) 10 MG tablet Take 1 tablet (10 mg total) by mouth daily. Patient taking differently: Take 10 mg by mouth every evening.  05/16/18 08/14/18 Yes Richardo Priest, MD  metoprolol tartrate (LOPRESSOR) 25 MG tablet Take 1 tablet (25 mg total) by mouth 2 (two) times daily. 05/30/18  Yes Richardo Priest, MD  Multiple Vitamin (MULTIVITAMIN WITH MINERALS) TABS tablet Take 1 tablet by mouth daily.   Yes [provider]  naproxen sodium (ALEVE) 220 MG tablet Take 440 mg by mouth 2 (two) times daily as needed (pain).   Yes [provider]  nitroGLYCERIN (NITROSTAT) 0.4 MG SL tablet Place 1 tablet (0.4 mg total) under the tongue every 5 (five) minutes as needed for chest pain. 04/24/18  Yes Barrett, Evelene Croon, PA-C  prasugrel (EFFIENT) 10 MG TABS tablet Take 10 mg by mouth daily.   Yes [provider]  acetaminophen (TYLENOL) 325 MG tablet Take 2 tablets (650 mg total) by mouth every 4 (four) hours as needed for headache or mild pain. Patient not taking: Reported on 06/23/2018 04/24/18   Barrett, Evelene Croon, PA-C  penicillin v potassium (VEETID) 500 MG tablet Take 500-1,000 mg by mouth See admin instructions. Take 2 tablets (1000 mg) by mouth 1st dose, then take 1 tablet (500 mg) every 6 hours until gone    [provider]    Allergies:    Allergies  Allergen Reactions  . Brilinta [Ticagrelor] Shortness Of Breath    Social History:   Social History   Socioeconomic History  . Marital status: Married    Spouse name: Not  on file  . Number of children: Not on file  . Years of education: Not on file  . Highest education level: Not on file  Occupational History  . Not on file  Social Needs  . Financial resource strain: Not on file  . Food insecurity    Worry: Not on file    Inability: Not on file  . Transportation needs    Medical: Not on file    Non-medical: Not on file  Tobacco Use  . Smoking status: Former Smoker    Packs/day: 2.00    Years: 10.00    Pack years: 20.00    Types: Cigarettes  . Smokeless tobacco: Never Used  . Tobacco comment: Quti smoking 20 years ago  Substance and Sexual Activity  . Alcohol use: Yes    Comment: occ  . Drug use: No  . Sexual activity:  Yes  Lifestyle  . Physical activity    Days per week: Not on file    Minutes per session: Not on file  . Stress: Not on file  Relationships  . Social Musicianconnections    Talks on phone: Not on file    Gets together: Not on file    Attends religious service: Not on file    Active member of club or organization: Not on file    Attends meetings of clubs or organizations: Not on file    Relationship status: Not on file  . Intimate partner violence    Fear of current or ex partner: Not on file    Emotionally abused: Not on file    Physically abused: Not on file    Forced sexual activity: Not on file  Other Topics Concern  . Not on file  Social History Narrative  . Not on file    Family History:   The patient's family history includes Cancer in his mother; Heart disease (age of onset: 5452) in his father; Hypertension in his father.  ROS:  Please see the history of present illness.  All other ROS reviewed and negative.     Physical Exam/Data:   Vitals:   06/23/18 1400 06/23/18 1430 06/23/18 1638 06/23/18 1639  BP: 112/69 116/63  107/62  Pulse: (!) 58 (!) 58  70  Resp: 16 18  18   Temp:    99.4 F (37.4 C)  TempSrc:    Oral  SpO2: 99% 99%  96%  Weight:   (!) 157.9 kg   Height:   6\' 2"  (1.88 m)    No intake or  output data in the 24 hours ending 06/23/18 1832 Last 3 Weights 06/23/2018 06/15/2018 05/16/2018  Weight (lbs) 348 lb 3.2 oz 350 lb 348 lb 9.6 oz  Weight (kg) 157.942 kg 158.759 kg 158.124 kg    Body mass index is 44.71 kg/m.  General: Well developed, well nourished, in no acute distress. Rest of exam not completed due to uncertain COVID status  EKG: See HPI  Relevant CV Studies:  Mid RCA lesion is 95% stenosed.  Ost 1st Mrg to 1st Mrg lesion is 90% stenosed.  Prox LAD lesion is 35% stenosed.  A drug-eluting stent was successfully placed using a STENT RESOLUTE ONYX 3.5X15.  Post intervention, there is a 0% residual stenosis.  A drug-eluting stent was successfully placed using a STENT RESOLUTE ONYX [id number].  Post intervention, there is a 0% residual stenosis.   1.  Severe stenosis of the mid RCA, treated successfully with PCI using a drug-eluting stent (3.5 x 15 mm resolute Onyx DES) 2.  Severe stenosis of the first OM branch of the circumflex, treated successfully with PCI using a drug-eluting stent (2.75 x 34 mm resolute Onyx DES) 3.  Widely patent left main 4.  Mild nonobstructive disease of the LAD 5.  Normal LV systolic function    Laboratory Data:  Chemistry Recent Labs  Lab 06/23/18 1108  NA 137  K 4.2  CL 104  CO2 26  GLUCOSE 132*  BUN 16  CREATININE 1.06  CALCIUM 9.2  GFRNONAA >60  GFRAA >60  ANIONGAP 7    No results for input(s): PROT, ALBUMIN, AST, ALT, ALKPHOS, BILITOT in the last 168 hours. Hematology Recent Labs  Lab 06/23/18 1108  WBC 6.5  RBC 4.92  HGB 15.5  HCT 47.6  MCV 96.7  MCH 31.5  MCHC 32.6  RDW 13.2  PLT 252   Cardiac  Enzymes Recent Labs  Lab 06/23/18 1108 06/23/18 1335  TROPONINI <0.03 <0.03   No results for input(s): TROPIPOC in the last 168 hours.  BNPNo results for input(s): BNP, PROBNP in the last 168 hours.  DDimer No results for input(s): DDIMER in the last 168 hours.  Radiology/Studies:  Dg Chest 2 View   Result Date: 06/23/2018 CLINICAL DATA:  RIGHT anterior chest pain EXAM: CHEST - 2 VIEW COMPARISON:  04/22/2018 FINDINGS: Normal mediastinum and cardiac silhouette. Normal pulmonary vasculature. No evidence of effusion, infiltrate, or pneumothorax. No acute bony abnormality. IMPRESSION: No acute cardiopulmonary process. Electronically Signed   By: Genevive BiStewart  Edmunds M.D.   On: 06/23/2018 12:09    Assessment and Plan:   1.  Chest pain 53 year old man with recent PCI presenting with mild chest pain associated with exertion, with unremarkable EKG and undetectable troponin.  Will need stress test to evaluate further and rule out ischemia.  He is able to walk on a treadmill, but reports symptoms with very minimal exertion and is not sure he would be able to complete a treadmill stress test.  Therefore a pharmacologic stress test may be preferred. --will plan stress test Sunday or Monday    For questions or updates, please contact [facility] Please consult [email or web address] for contact info under Cardiology/STEMI.    Allen DerrySigned, Vonnie Ligman S, MD  06/23/2018 6:32 PM

## 2018-06-23 NOTE — Plan of Care (Signed)
Discussed plan of care with Dr. Malvin Johns at [facility]. Mr. Brandon [name] is a 53 year old male with PMH of HTN, HLD, prediabetes, CAD with Nstemi in April req DES to mid RCA and first OM branch of circumflex; who presented with complaints of chest pain.  Recent COVID exposure with reports of a mild cough.  Initial troponin negative and EKG without acute ischemic changes.  Screening COVID test negative.  Admitted for chest pain rule out.  Trend cardiac enzymes.  Admit as a person under investigation to a telemetry bed, and repeat COVID testing on admission.

## 2018-06-23 NOTE — ED Provider Notes (Signed)
[facility] EMERGENCY DEPARTMENT Provider Note   CSN: [id number] Arrival date & time: 06/23/18  1048    History   Chief Complaint Chief Complaint  Patient presents with  . Chest Pain    HPI Brandon [name] Rachel is a 53 y.o. male.     Patient is a 53 year old male who has a history of coronary artery disease, hypertension and hyperlipidemia as well as prediabetes.  He had an end STEMI in April of this year and had 2 stents placed at that time.  He says he has had some chronic chest pain since he had this an STEMI but he says it is mild "floating" chest pain.  He had an exposure to COVID about a week ago.  He states his daughter who has been visiting him tested positive for COVID.  They have been in home quarantine since that time.  He was previously doing cardiac rehab but since he has been self quarantine, he has been doing exercises at home.  He states initially was doing okay with the exercises and then 2 days ago he started feeling bad when he started doing exercises.  He got nauseated and lightheaded.  He has had some worsening chest pain and shortness of breath that seems to get worse when he tries to exert himself.  The symptoms have been going on intermittently over the last 2 days and are worse with any exertion.  He also has had a cough which is productive of some yellow sputum over the last 2 to 3 days.  No known fevers.  He has had a little bit of nausea and upset stomach but no vomiting.  No abdominal pain.  No increased leg swelling.  He contacted his cardiologist, Dr. Dulce SellarMunley who advised him to be evaluated in the emergency department.     Past Medical History:  Diagnosis Date  . ACS (acute coronary syndrome) (HCC) 04/2018  . Acute coronary syndrome (HCC) 04/23/2018  . Arthritis    both knees  . Arthritis of knee, degenerative 05/14/2015  . CAD in native artery 05/06/2018  . Chronic right shoulder pain 05/28/2015  . Complication of anesthesia    " i HAVE A HARD TIME  WAKING UP "  . Coronary artery disease   . Essential hypertension 05/14/2015  . Hypertension   . Hypothyroidism 05/14/2015  . Mixed hyperlipidemia 05/14/2015  . Non-ST elevation (NSTEMI) myocardial infarction (HCC)   . Pre-diabetes 03/30/2016   2017: x/6.7 2018: 103/6.4  . Right elbow pain 05/28/2015    Patient Active Problem List   Diagnosis Date Noted  . Chest pain 06/23/2018  . SOB (shortness of breath) on exertion 05/16/2018  . CAD in native artery 05/06/2018  . Acute coronary syndrome (HCC) 04/23/2018  . Non-ST elevation (NSTEMI) myocardial infarction (HCC)   . Pre-diabetes 03/30/2016  . Chronic right shoulder pain 05/28/2015  . Right elbow pain 05/28/2015  . Essential hypertension 05/14/2015  . Mixed hyperlipidemia 05/14/2015  . Arthritis of knee, degenerative 05/14/2015    Past Surgical History:  Procedure Laterality Date  . CORONARY STENT INTERVENTION N/A 04/23/2018   Procedure: CORONARY STENT INTERVENTION;  Surgeon: Tonny Bollmanooper, Michael, MD;  [facility];  Service: Cardiovascular;  Laterality: N/A;  . EXCISION HAGLUND'S DEFORMITY WITH ACHILLES TENDON REPAIR Right 05/22/2014   Procedure: RIGHT ACHILLES DEBRIDEMENT; HAGLUND'S EXCISION ;  Surgeon: Toni ArthursJohn Hewitt, MD;  Location: [facility];  Service: Orthopedics;  Laterality: Right;  . GASTROCNEMIUS RECESSION Right 05/22/2014   Procedure: GASTROC  RECESSION ;  Surgeon: Wylene Simmer, MD;  Location: [facility];  Service: Orthopedics;  Laterality: Right;  . KNEE SURGERY     l/knee  . LAPAROSCOPIC GASTRIC SLEEVE RESECTION    . LEFT HEART CATH AND CORONARY ANGIOGRAPHY N/A 04/23/2018   Procedure: LEFT HEART CATH AND CORONARY ANGIOGRAPHY;  Surgeon: Sherren Mocha, MD;  Location: [facility] CV LAB;  Service: Cardiovascular;  Laterality: N/A;  . MOUTH SURGERY     Root canal        Home Medications    Prior to Admission medications   Medication Sig Start Date End Date Taking? Authorizing  Provider  aspirin 81 MG chewable tablet Chew 1 tablet (81 mg total) by mouth daily. 04/24/18  Yes Barrett, Evelene Croon, PA-C  atorvastatin (LIPITOR) 80 MG tablet Take 1 tablet (80 mg total) by mouth daily at 6 PM. 05/30/18  Yes Munley, Hilton Cork, MD  ezetimibe (ZETIA) 10 MG tablet Take 1 tablet (10 mg total) by mouth daily. 05/16/18 08/14/18 Yes Richardo Priest, MD  lisinopril (PRINIVIL,ZESTRIL) 20 MG tablet Take 20 mg by mouth daily. 03/17/14  Yes [provider]  metoprolol tartrate (LOPRESSOR) 25 MG tablet Take 1 tablet (25 mg total) by mouth 2 (two) times daily. 05/30/18  Yes Richardo Priest, MD  Multiple Vitamin (MULTIVITAMIN) tablet Take 1 tablet by mouth daily.   Yes [provider]  prasugrel (EFFIENT) 10 MG TABS tablet Take 10 mg by mouth daily.   Yes [provider]  acetaminophen (TYLENOL) 325 MG tablet Take 2 tablets (650 mg total) by mouth every 4 (four) hours as needed for headache or mild pain. 04/24/18   Barrett, Evelene Croon, PA-C  nitroGLYCERIN (NITROSTAT) 0.4 MG SL tablet Place 1 tablet (0.4 mg total) under the tongue every 5 (five) minutes as needed for chest pain. 04/24/18   Barrett, Evelene Croon, PA-C  penicillin v potassium (VEETID) 500 MG tablet Take 500 mg by mouth 3 (three) times daily.    [provider]    Family History Family History  Problem Relation Age of Onset  . Cancer Mother   . Heart disease Father 87  . Hypertension Father     Social History Social History   Tobacco Use  . Smoking status: Former Smoker    Packs/day: 2.00    Years: 10.00    Pack years: 20.00    Types: Cigarettes  . Smokeless tobacco: Never Used  . Tobacco comment: Quti smoking 20 years ago  Substance Use Topics  . Alcohol use: Yes    Comment: occ  . Drug use: No     Allergies   Brilinta [ticagrelor]   Review of Systems Review of Systems  Constitutional: Positive for fatigue. Negative for chills, diaphoresis and fever.  HENT: Negative for congestion,  rhinorrhea and sneezing.   Eyes: Negative.   Respiratory: Positive for cough and shortness of breath. Negative for chest tightness.   Cardiovascular: Positive for chest pain. Negative for leg swelling.  Gastrointestinal: Positive for nausea. Negative for abdominal pain, blood in stool, diarrhea and vomiting.  Genitourinary: Negative for difficulty urinating, flank pain, frequency and hematuria.  Musculoskeletal: Negative for arthralgias and back pain.  Skin: Negative for rash.  Neurological: Negative for dizziness, speech difficulty, weakness, numbness and headaches.     Physical Exam Updated Vital Signs BP 116/63   Pulse (!) 58   Temp 98.3 F (36.8 C) (Oral)   Resp 18   SpO2 99%   Physical Exam Constitutional:  Appearance: He is well-developed.  HENT:     Head: Normocephalic and atraumatic.  Eyes:     Pupils: Pupils are equal, round, and reactive to light.  Neck:     Musculoskeletal: Normal range of motion and neck supple.  Cardiovascular:     Rate and Rhythm: Normal rate and regular rhythm.     Heart sounds: Normal heart sounds.  Pulmonary:     Effort: Pulmonary effort is normal. No respiratory distress.     Breath sounds: Normal breath sounds. No wheezing or rales.  Chest:     Chest wall: No tenderness.  Abdominal:     General: Bowel sounds are normal.     Palpations: Abdomen is soft.     Tenderness: There is no abdominal tenderness. There is no guarding or rebound.  Musculoskeletal: Normal range of motion.     Comments: Trace edema to his lower extremities bilaterally  Lymphadenopathy:     Cervical: No cervical adenopathy.  Skin:    General: Skin is warm and dry.     Findings: No rash.  Neurological:     Mental Status: He is alert and oriented to person, place, and time.      ED Treatments / Results  Labs (all labs ordered are listed, but only abnormal results are displayed) Labs Reviewed  BASIC METABOLIC PANEL - Abnormal; Notable for the following  components:      Result Value   Glucose, Bld 132 (*)    All other components within normal limits  SARS CORONAVIRUS 2 (HOSP ORDER, PERFORMED IN [place] [facility] VIA ABBOTT ID)  CBC  TROPONIN I  TROPONIN I  TROPONIN I    EKG EKG Interpretation  Date/Time:  Saturday June 23 2018 10:55:44 EDT Ventricular Rate:  67 PR Interval:    QRS Duration: 102 QT Interval:  384 QTC Calculation: 406 R Axis:   143 Text Interpretation:  Sinus rhythm Left posterior fascicular block Baseline wander in lead(s) V2 since last tracing no significant change Confirmed by Rolan BuccoBelfi, Manahil Vanzile [phone]([id number]) on 06/23/2018 11:31:47 AM   Radiology Dg Chest 2 View  Result Date: 06/23/2018 CLINICAL DATA:  RIGHT anterior chest pain EXAM: CHEST - 2 VIEW COMPARISON:  04/22/2018 FINDINGS: Normal mediastinum and cardiac silhouette. Normal pulmonary vasculature. No evidence of effusion, infiltrate, or pneumothorax. No acute bony abnormality. IMPRESSION: No acute cardiopulmonary process. Electronically Signed   By: Genevive BiStewart  Edmunds M.D.   On: 06/23/2018 12:09    Procedures Procedures (including critical care time)  Medications Ordered in ED Medications  aspirin chewable tablet 324 mg (324 mg Oral Given 06/23/18 1334)     Initial Impression / Assessment and Plan / ED Course  I have reviewed the triage vital signs and the nursing notes.  Pertinent labs & imaging results that were available during my care of the patient were reviewed by me and considered in my medical decision making (see chart for details).      Patient is a 53 year old male who presents with chest pain.  He has a significant prior history including recent end STEMI with stent placement.  He now has worsening chest pain with exertional symptoms.  He has no ischemic changes on EKG.  His troponin is negative.  I spoke with Dr. Jearld PiesMcClean with cardiology who recommends hospitalist admission for cardiac evaluation.  I spoke with Dr. Katrinka BlazingSmith who has accepted the patient  [facility].    Final Clinical Impressions(s) / ED Diagnoses   Final diagnoses:  Atypical chest  pain    ED Discharge Orders    None       Rolan BuccoBelfi, Driana Dazey, MD 06/23/18 1459

## 2018-06-23 NOTE — Progress Notes (Signed)
Spoke with [employer] at [facility] for report.

## 2018-06-23 NOTE — H&P (Signed)
History and Physical    Brandon PanderLyle Gros UEA:[id number] DOB: 10-Oct-1965 DOA: 06/23/2018  PCP: Olive Bassough, Robert L, MD Patient coming from: Home  Chief Complaint: Chest pain  HPI: Brandon [name] is a 53 y.o. male with medical history significant of NSTEMI/CAD status post PCI earlier this year, hypertension, hyperlipidemia.  Patient presented secondary to worsening substernal chest pain over the last 3 days initially he had more dyspnea on exertion which progressively worsened.  This morning, however, he reported some significant substernal squeezing chest pain with radiation to his left shoulder with associated dyspnea on exertion.  His chest pain would resolve when he ceased ambulation.  He reports that the symptoms were very similar to his symptoms when he had his myocardial infarction.  He reports calling his cardiologist who recommended for him to be evaluated in the emergency department.  He reports he scheduled stress test as an outpatient in early July.  He did not take any nitroglycerin for his pain.  ED Course: Vitals: Afebrile, normal pulse, respirations and BP. On room air Labs: unremarkable Imaging: Chest x-ray unremarkable Medications/Course: Aspirin  Review of Systems: Review of Systems  Constitutional: Negative for chills, fever and malaise/fatigue.  Respiratory: Positive for cough, sputum production and shortness of breath (with ambulation). Negative for wheezing.   Cardiovascular: Positive for chest pain. Negative for leg swelling.  Gastrointestinal: Positive for abdominal pain (at night). Negative for constipation, diarrhea, nausea and vomiting.  All other systems reviewed and are negative.   Past Medical History:  Diagnosis Date  . ACS (acute coronary syndrome) (HCC) 04/2018  . Acute coronary syndrome (HCC) 04/23/2018  . Arthritis    both knees  . Arthritis of knee, degenerative 05/14/2015  . CAD in native artery 05/06/2018  . Chronic right shoulder pain 05/28/2015  .  Complication of anesthesia    " i HAVE A HARD TIME WAKING UP "  . Coronary artery disease   . Essential hypertension 05/14/2015  . Hypertension   . Mixed hyperlipidemia 05/14/2015  . Non-ST elevation (NSTEMI) myocardial infarction (HCC)   . Pre-diabetes 03/30/2016   2017: x/6.7 2018: 103/6.4  . Right elbow pain 05/28/2015    Past Surgical History:  Procedure Laterality Date  . CORONARY STENT INTERVENTION N/A 04/23/2018   Procedure: CORONARY STENT INTERVENTION;  Surgeon: Tonny Bollmanooper, Michael, MD;  [facility];  Service: Cardiovascular;  Laterality: N/A;  . EXCISION HAGLUND'S DEFORMITY WITH ACHILLES TENDON REPAIR Right 05/22/2014   Procedure: RIGHT ACHILLES DEBRIDEMENT; HAGLUND'S EXCISION ;  Surgeon: Toni ArthursJohn Hewitt, MD;  Location: [facility];  Service: Orthopedics;  Laterality: Right;  . GASTROCNEMIUS RECESSION Right 05/22/2014   Procedure: GASTROC RECESSION ;  Surgeon: Toni ArthursJohn Hewitt, MD;  Location: [facility];  Service: Orthopedics;  Laterality: Right;  . KNEE SURGERY     l/knee  . LAPAROSCOPIC GASTRIC SLEEVE RESECTION    . LEFT HEART CATH AND CORONARY ANGIOGRAPHY N/A 04/23/2018   Procedure: LEFT HEART CATH AND CORONARY ANGIOGRAPHY;  Surgeon: Tonny Bollmanooper, Michael, MD;  [facility];  Service: Cardiovascular;  Laterality: N/A;  . MOUTH SURGERY     Root canal     reports that he has quit smoking. His smoking use included cigarettes. He has a 20.00 pack-year smoking history. He has never used smokeless tobacco. He reports current alcohol use. He reports that he does not use drugs.  Allergies  Allergen Reactions  . Brilinta [Ticagrelor] Shortness Of Breath    Family History  Problem Relation Age of Onset  .  Cancer Mother   . Heart disease Father 67  . Hypertension Father    Prior to Admission medications   Medication Sig Start Date End Date Taking? Authorizing Provider  aspirin 81 MG chewable tablet Chew 1 tablet (81 mg total) by mouth  daily. 04/24/18  Yes Barrett, Evelene Croon, PA-C  atorvastatin (LIPITOR) 80 MG tablet Take 1 tablet (80 mg total) by mouth daily at 6 PM. 05/30/18  Yes Munley, Hilton Cork, MD  enalapril (VASOTEC) 10 MG tablet Take 10 mg by mouth daily.   Yes [provider]  ezetimibe (ZETIA) 10 MG tablet Take 1 tablet (10 mg total) by mouth daily. Patient taking differently: Take 10 mg by mouth every evening.  05/16/18 08/14/18 Yes Richardo Priest, MD  metoprolol tartrate (LOPRESSOR) 25 MG tablet Take 1 tablet (25 mg total) by mouth 2 (two) times daily. 05/30/18  Yes Richardo Priest, MD  Multiple Vitamin (MULTIVITAMIN WITH MINERALS) TABS tablet Take 1 tablet by mouth daily.   Yes [provider]  naproxen sodium (ALEVE) 220 MG tablet Take 440 mg by mouth 2 (two) times daily as needed (pain).   Yes [provider]  nitroGLYCERIN (NITROSTAT) 0.4 MG SL tablet Place 1 tablet (0.4 mg total) under the tongue every 5 (five) minutes as needed for chest pain. 04/24/18  Yes Barrett, Evelene Croon, PA-C  prasugrel (EFFIENT) 10 MG TABS tablet Take 10 mg by mouth daily.   Yes [provider]  acetaminophen (TYLENOL) 325 MG tablet Take 2 tablets (650 mg total) by mouth every 4 (four) hours as needed for headache or mild pain. Patient not taking: Reported on 06/23/2018 04/24/18   Barrett, Evelene Croon, PA-C  penicillin v potassium (VEETID) 500 MG tablet Take 500-1,000 mg by mouth See admin instructions. Take 2 tablets (1000 mg) by mouth 1st dose, then take 1 tablet (500 mg) every 6 hours until gone    [provider]    Physical Exam:  Physical Exam Constitutional:      General: He is not in acute distress.    Appearance: He is well-developed. He is not diaphoretic.  Eyes:     Conjunctiva/sclera: Conjunctivae normal.     Pupils: Pupils are equal, round, and reactive to light.  Neck:     Musculoskeletal: Normal range of motion.  Cardiovascular:     Rate and Rhythm: Normal rate and regular rhythm.      Heart sounds: Normal heart sounds. No murmur.  Pulmonary:     Effort: Pulmonary effort is normal. No respiratory distress.     Breath sounds: Normal breath sounds. No wheezing or rales.  Abdominal:     General: Bowel sounds are normal. There is no distension.     Palpations: Abdomen is soft.     Tenderness: There is no abdominal tenderness. There is no guarding or rebound.  Musculoskeletal: Normal range of motion.        General: No tenderness.     Right lower leg: No edema.     Left lower leg: No edema.  Lymphadenopathy:     Cervical: No cervical adenopathy.  Skin:    General: Skin is warm and dry.  Neurological:     General: No focal deficit present.     Mental Status: He is alert and oriented to person, place, and time.      Labs on Admission: I have personally reviewed following labs and imaging studies  CBC: Recent Labs  Lab 06/23/18 1108  WBC 6.5  HGB 15.5  HCT 47.6  MCV 96.7  PLT 252    Basic Metabolic Panel: Recent Labs  Lab 06/23/18 1108  NA 137  K 4.2  CL 104  CO2 26  GLUCOSE 132*  BUN 16  CREATININE 1.06  CALCIUM 9.2    GFR: Estimated Creatinine Clearance: 128.2 mL/min (by C-G formula based on SCr of 1.06 mg/dL).  Liver Function Tests: No results for input(s): AST, ALT, ALKPHOS, BILITOT, PROT, ALBUMIN in the last 168 hours. No results for input(s): LIPASE, AMYLASE in the last 168 hours. No results for input(s): AMMONIA in the last 168 hours.  Coagulation Profile: No results for input(s): INR, PROTIME in the last 168 hours.  Cardiac Enzymes: Recent Labs  Lab 06/23/18 1108 06/23/18 1335  TROPONINI <0.03 <0.03    BNP (last 3 results) Recent Labs    05/15/18 0822  PROBNP 95   Urine analysis:    Component Value Date/Time   BILIRUBINUR negative 09/15/2015 1439   KETONESUR negative 09/15/2015 1439   PROTEINUR negative 09/15/2015 1439   UROBILINOGEN 0.2 09/15/2015 1439   NITRITE Negative 09/15/2015 1439   LEUKOCYTESUR Negative  09/15/2015 1439     Radiological Exams on Admission: Dg Chest 2 View  Result Date: 06/23/2018 CLINICAL DATA:  RIGHT anterior chest pain EXAM: CHEST - 2 VIEW COMPARISON:  04/22/2018 FINDINGS: Normal mediastinum and cardiac silhouette. Normal pulmonary vasculature. No evidence of effusion, infiltrate, or pneumothorax. No acute bony abnormality. IMPRESSION: No acute cardiopulmonary process. Electronically Signed   By: Genevive BiStewart  Edmunds M.D.   On: 06/23/2018 12:09    EKG: Independently reviewed. Non-ischemic. Normal sinus rhythm.  Assessment/Plan Principal Problem:   Chest pain Active Problems:   Essential hypertension   Mixed hyperlipidemia   Non-ST elevation (NSTEMI) myocardial infarction [facility](HCC)   CAD in native artery   Chest pain Patient with anginal symptoms. EKG without significant changes and troponin negative so far. No pain at rest.  Cardiology consulted  CAD Patient recently had an NSTEMI requiring LHC and PCI with DES of mid RCA and first OM branch of circumflex -Continue Effient and aspirin  Hyperlipidemia -Continue atorvastatin  Essential hypertension -Continue metoprolol, enalapril  COVID-19 testing Patient with a mildly productive cough. COVID-19 exposure. [facility] -Repeat testing here -Droplet/contact pending test results   DVT prophylaxis: Lovenox Code Status: Full code Family Communication: None at bedside Disposition Plan: Discharge pending cardiac workup Consults called: Cardiology Admission status: Observation   Jacquelin Hawkingalph Josemiguel Gries, MD [facility] 06/23/2018, 5:39 PM

## 2018-06-23 NOTE — ED Notes (Signed)
ED TO INPATIENT HANDOFF REPORT  ED Nurse Name and Phone #: Jon Gillslexis [phone]  S Name/Age/Gender Brandon [name] 53 y.o. male Room/Bed: MH04/MH04  Code Status   Code Status: Prior  Home/SNF/Other Home Patient oriented to: self, place, time and situation Is this baseline? Yes   Triage Complete: Triage complete  Chief Complaint Chest Pain  Triage Note Pt here with chest pain x 1 month post cardiac cath with 2 stents placed. The chest pain has begun to get worse and is accompanied with SOB with activity.    Allergies Allergies  Allergen Reactions  . Brilinta [Ticagrelor] Shortness Of Breath    Level of Care/Admitting Diagnosis ED Disposition    ED Disposition Condition Comment   Admit  [facility]ospital [facility] [[id number]]  Level of Care: Telemetry Medical [104]  I expect the patient will be discharged within 24 hours: No (not [name] candidate for 5C-Observation unit)  Covid Evaluation: Person Under Investigation (PUI)  Isolation Risk Level: Low Risk/Droplet (Less than 4L [place] supplementation)  Diagnosis: Chest pain [454098][[id number]]  Admitting Physician: Brandon [name] [[id number]]  Attending Physician: Brandon [name] [[id number]]  PT Class (Do Not Modify): Observation [104]  PT Acc Code (Do Not Modify): Observation [10022]       B Medical/Surgery History Past Medical History:  Diagnosis Date  . ACS (acute coronary syndrome) (HCC) 04/2018  . Acute coronary syndrome (HCC) 04/23/2018  . Arthritis    both knees  . Arthritis of knee, degenerative 05/14/2015  . CAD in native artery 05/06/2018  . Chronic right shoulder pain 05/28/2015  . Complication of anesthesia    " i HAVE [name] HARD TIME WAKING UP "  . Coronary artery disease   . Essential hypertension 05/14/2015  . Hypertension   . Hypothyroidism 05/14/2015  . Mixed hyperlipidemia 05/14/2015  . Non-ST elevation (NSTEMI) myocardial infarction (HCC)   . Pre-diabetes 03/30/2016   2017: x/6.7 2018: 103/6.4  . Right elbow pain  05/28/2015   Past Surgical History:  Procedure Laterality Date  . CORONARY STENT INTERVENTION N/[name] 04/23/2018   Procedure: CORONARY STENT INTERVENTION;  Surgeon: Brandon [name];  [facility];  Service: Cardiovascular;  Laterality: N/[name];  . EXCISION HAGLUND'S DEFORMITY WITH ACHILLES TENDON REPAIR Right 05/22/2014   Procedure: RIGHT ACHILLES DEBRIDEMENT; HAGLUND'S EXCISION ;  Surgeon: Brandon [name];  Location: [facility];  Service: Orthopedics;  Laterality: Right;  . GASTROCNEMIUS RECESSION Right 05/22/2014   Procedure: GASTROC RECESSION ;  Surgeon: Brandon [name];  Location: [facility];  Service: Orthopedics;  Laterality: Right;  . KNEE SURGERY     l/knee  . LAPAROSCOPIC GASTRIC SLEEVE RESECTION    . LEFT HEART CATH AND CORONARY ANGIOGRAPHY N/[name] 04/23/2018   Procedure: LEFT HEART CATH AND CORONARY ANGIOGRAPHY;  Surgeon: Brandon [name];  [facility];  Service: Cardiovascular;  Laterality: N/[name];  . MOUTH SURGERY     Root canal     [name] IV Location/Drains/Wounds Patient Lines/Drains/Airways Status   Active Line/Drains/Airways    Name:   Placement date:   Placement time:   Site:   Days:   Peripheral IV 06/23/18 Left Antecubital   06/23/18    1110    Antecubital   less than 1          Intake/Output Last 24 hours No intake or output data in the 24 hours ending 06/23/18 1448  Labs/Imaging Results for orders placed or performed during the hospital encounter of 06/23/18 (  from the past 48 hour(s))  Basic metabolic panel     Status: Abnormal   Collection Time: 06/23/18 11:08 AM  Result Value Ref Range   Sodium 137 135 - 145 mmol/L   Potassium 4.2 3.5 - 5.1 mmol/L   Chloride 104 98 - 111 mmol/L   CO2 26 22 - 32 mmol/L   Glucose, Bld 132 (H) 70 - 99 mg/dL   BUN 16 6 - 20 mg/dL   Creatinine, Ser 1.611.06 0.61 - 1.24 mg/dL   Calcium 9.2 8.9 - 09.610.3 mg/dL   GFR calc non Af Amer >60 >60 mL/min   GFR calc Af Amer >60 >60 mL/min    Anion gap 7 5 - 15    Comment: Performed at [facility] [place], [address] [place], [place] [address]  CBC     Status: None   Collection Time: 06/23/18 11:08 AM  Result Value Ref Range   WBC 6.5 4.0 - 10.5 K/uL   RBC 4.92 4.22 - 5.81 MIL/uL   Hemoglobin 15.5 13.0 - 17.0 g/dL   HCT 98.147.6 19.139.0 - 47.852.0 %   MCV 96.7 80.0 - 100.0 fL   MCH 31.5 26.0 - 34.0 pg   MCHC 32.6 30.0 - 36.0 g/dL   RDW 29.513.2 62.111.5 - 30.815.5 %   Platelets 252 150 - 400 K/uL   nRBC 0.0 0.0 - 0.2 %    Comment: Performed at [facility] [place], [address] [place], [place] [address]  Troponin I - ONCE - STAT     Status: None   Collection Time: 06/23/18 11:08 AM  Result Value Ref Range   Troponin I <0.03 <0.03 ng/mL    Comment: Performed at [facility] [place], [address] [place], [place] [address]  SARS Coronavirus 2 (Hosp order,Performed in [facility] lab via [employer] ID)     Status: None   Collection Time: 06/23/18 12:48 PM   Specimen: Dry Nasal Swab (Abbott ID Now)  Result Value Ref Range   SARS Coronavirus 2 (Abbott ID Now) NEGATIVE NEGATIVE    Comment: (NOTE) Interpretive Result Comment(s): COVID 19 Positive SARS CoV 2 target nucleic acids are DETECTED. The SARS CoV 2 RNA is generally detectable in upper and lower respiratory specimens during the acute phase of infection.  Positive results are indicative of active infection with SARS CoV 2.  Clinical correlation with patient history and other diagnostic information is necessary to determine patient infection status.  Positive results do not rule out bacterial infection or coinfection with other viruses. The expected result is Negative. COVID 19 Negative SARS CoV 2 target nucleic acids are NOT DETECTED. The SARS CoV 2 RNA is generally detectable in upper and lower respiratory specimens during the acute phase of infection.  Negative results do not preclude SARS CoV 2 infection, do not rule out coinfections with other pathogens, and  should not be used as the sole basis for treatment or other patient management decisions.  Negative results must be combined with clinical  observations, patient history, and epidemiological information. The expected result is Negative. Invalid Presence or absence of SARS CoV 2 nucleic acids cannot be determined. Repeat testing was performed on the submitted specimen and repeated Invalid results were obtained.  If clinically indicated, additional testing on [name] new specimen with an alternate test methodology [phone]([id number] is advised.  The SARS CoV 2 RNA is generally detectable in upper and lower respiratory specimens during the acute phase of infection. The expected  result is Negative. Fact Sheet for Patients:  GolfingFamily.no Fact Sheet for Healthcare Providers: https://www.hernandez-brewer.com/ This test is not yet approved or cleared by the [place] FDA and has been authorized for detection and/or diagnosis of SARS CoV 2 by FDA under an Emergency Use Authorization (EUA).  This EUA will remain in effect (meaning this test can be used) for the duration of the COVID19 d eclaration under Section 564(b)(1) of the Act, 21 U.S.C. section [phone] 3(b)(1), unless the authorization is terminated or revoked sooner. Performed at [facility], [address] [place], [place] [address]   Troponin I - Now Then [facility]     Status: None   Collection Time: 06/23/18  1:35 PM  Result Value Ref Range   Troponin I <0.03 <0.03 ng/mL    Comment: Performed at [facility], [address] [place], [place] [address]   Dg Chest 2 View  Result Date: 06/23/2018 CLINICAL DATA:  RIGHT anterior chest pain EXAM: CHEST - 2 VIEW COMPARISON:  04/22/2018 FINDINGS: Normal mediastinum and cardiac silhouette. Normal pulmonary vasculature. No evidence of effusion, infiltrate, or pneumothorax. No acute bony abnormality. IMPRESSION: No acute cardiopulmonary process.  Electronically Signed   By: Suzy Bouchard M.D.   On: 06/23/2018 12:09    Pending Labs Unresulted Labs (From admission, onward)    Start     Ordered   06/23/18 1400  Troponin I - Now Then Q3H  Now then every 3 hours,   R (with STAT occurrences)     06/23/18 1326          Vitals/Pain Today's Vitals   06/23/18 1300 06/23/18 1330 06/23/18 1400 06/23/18 1430  BP: 113/76 119/67 112/69 116/63  Pulse: 62 63 (!) 58 (!) 58  Resp: 18 12 16 18   Temp:      TempSrc:      SpO2: 100% 100% 99% 99%  PainSc:        Isolation Precautions No active isolations  Medications Medications  aspirin chewable tablet 324 mg (324 mg Oral Given 06/23/18 1334)    Mobility walks Low fall risk   Focused Assessments Cardiac Assessment Handoff:    Lab Results  Component Value Date   TROPONINI <0.03 06/23/2018   No results found for: DDIMER Does the Patient currently have chest pain? Yes     R Recommendations: See Admitting Provider Note  Report given to:   Additional Notes:

## 2018-06-23 NOTE — Progress Notes (Signed)
06/23/2018 Patient transfer from [address] center at 1641. Patient is alert, oriented and ambulatory. Patient was placed on droplet and contact for Covid rule out. Patient skin was assess and intact. He received CHG bath and was placed on telemetry. [facility] RN.

## 2018-06-23 NOTE — ED Triage Notes (Signed)
Pt here with chest pain x 1 month post cardiac cath with 2 stents placed. The chest pain has begun to get worse and is accompanied with SOB with activity.

## 2018-06-24 ENCOUNTER — Observation Stay (HOSPITAL_COMMUNITY): Payer: BC Managed Care – PPO

## 2018-06-24 ENCOUNTER — Observation Stay (HOSPITAL_BASED_OUTPATIENT_CLINIC_OR_DEPARTMENT_OTHER): Payer: BC Managed Care – PPO

## 2018-06-24 DIAGNOSIS — R0789 Other chest pain: Secondary | ICD-10-CM | POA: Diagnosis not present

## 2018-06-24 DIAGNOSIS — E782 Mixed hyperlipidemia: Secondary | ICD-10-CM | POA: Diagnosis not present

## 2018-06-24 DIAGNOSIS — R079 Chest pain, unspecified: Secondary | ICD-10-CM

## 2018-06-24 DIAGNOSIS — I10 Essential (primary) hypertension: Secondary | ICD-10-CM | POA: Diagnosis not present

## 2018-06-24 DIAGNOSIS — R072 Precordial pain: Secondary | ICD-10-CM | POA: Diagnosis not present

## 2018-06-24 DIAGNOSIS — I2510 Atherosclerotic heart disease of native coronary artery without angina pectoris: Secondary | ICD-10-CM | POA: Diagnosis not present

## 2018-06-24 LAB — D-DIMER, QUANTITATIVE: D-Dimer, Quant: 0.61 ug/mL-FEU — ABNORMAL HIGH (ref 0.00–0.50)

## 2018-06-24 LAB — NM MYOCAR MULTI W/SPECT W/WALL MOTION / EF
Estimated workload: 1 METS
Exercise duration (min): 0 min
Exercise duration (sec): 0 s
MPHR: 167 {beats}/min
Peak HR: 87 {beats}/min
Percent HR: 52 %
Rest HR: 60 {beats}/min

## 2018-06-24 MED ORDER — TECHNETIUM TC 99M TETROFOSMIN IV KIT
30.0000 | PACK | Freq: Once | INTRAVENOUS | Status: AC | PRN
  Administered 2018-06-24: 30 via INTRAVENOUS

## 2018-06-24 MED ORDER — REGADENOSON 0.4 MG/5ML IV SOLN
0.4000 mg | Freq: Once | INTRAVENOUS | Status: AC
  Administered 2018-06-24: 0.4 mg via INTRAVENOUS
  Filled 2018-06-24: qty 5

## 2018-06-24 MED ORDER — IOHEXOL 350 MG/ML SOLN
75.0000 mL | Freq: Once | INTRAVENOUS | Status: AC | PRN
  Administered 2018-06-24: 75 mL via INTRAVENOUS

## 2018-06-24 MED ORDER — TECHNETIUM TC 99M TETROFOSMIN IV KIT
10.0000 | PACK | Freq: Once | INTRAVENOUS | Status: AC | PRN
  Administered 2018-06-24: 10 via INTRAVENOUS

## 2018-06-24 MED ORDER — REGADENOSON 0.4 MG/5ML IV SOLN
INTRAVENOUS | Status: AC
  Administered 2018-06-24: 14:00:00
  Filled 2018-06-24: qty 5

## 2018-06-24 NOTE — Progress Notes (Addendum)
Progress Note  Patient Name: Brandon [name] Date of Encounter: 06/24/2018  Primary Cardiologist: Brandon HerrlichBrian Munley, MD   Subjective   No recurrent episodes of chest pain overnight. Has been NPO since midnight for stress testing.   Inpatient Medications    Scheduled Meds: . regadenoson      . aspirin  81 mg Oral Daily  . atorvastatin  80 mg Oral q1800  . enalapril  10 mg Oral Daily  . enoxaparin (LOVENOX) injection  40 mg Subcutaneous Q24H  . ezetimibe  10 mg Oral QPM  . metoprolol tartrate  25 mg Oral BID  . prasugrel  10 mg Oral Daily   Continuous Infusions:  PRN Meds: acetaminophen, ondansetron (ZOFRAN) IV   Vital Signs    Vitals:   06/23/18 1639 06/23/18 2321 06/24/18 0817 06/24/18 1221  BP: 107/62 (!) 96/47 133/82 125/75  Pulse: 70 (!) 51 (!) 59   Resp: 18 16    Temp: 99.4 F (37.4 C) 98.1 F (36.7 C) 97.9 F (36.6 C)   TempSrc: Oral Oral Oral   SpO2: 96% 98% 99%   Weight:      Height:       No intake or output data in the 24 hours ending 06/24/18 1246  Last 3 Weights 06/23/2018 06/15/2018 05/16/2018  Weight (lbs) 348 lb 3.2 oz 350 lb 348 lb 9.6 oz  Weight (kg) 157.942 kg 158.759 kg 158.124 kg      Telemetry    Not reviewed. Evaluated in Nuc Med.   ECG    NSR, HR 67, with RAD.  - Personally Reviewed  Physical Exam   General: Well developed, overweight Caucasian male appearing in no acute distress. Head: Normocephalic, atraumatic.  Neck: Supple without bruits, JVD not elevated. Lungs:  Resp regular and unlabored, CTA without wheezing or rales. Heart: RRR, S1, S2, no S3, S4, or murmur; no rub. Abdomen: Soft, non-tender, non-distended with normoactive bowel sounds. No hepatomegaly. No rebound/guarding. No obvious abdominal masses. Extremities: No clubbing, cyanosis, or lower extremity edema. Distal pedal pulses are 2+ bilaterally. Neuro: Alert and oriented X 3. Moves all extremities spontaneously. Psych: Normal affect.  Labs    Chemistry Recent Labs   Lab 06/23/18 1108  NA 137  K 4.2  CL 104  CO2 26  GLUCOSE 132*  BUN 16  CREATININE 1.06  CALCIUM 9.2  GFRNONAA >60  GFRAA >60  ANIONGAP 7     Hematology Recent Labs  Lab 06/23/18 1108  WBC 6.5  RBC 4.92  HGB 15.5  HCT 47.6  MCV 96.7  MCH 31.5  MCHC 32.6  RDW 13.2  PLT 252    Cardiac Enzymes Recent Labs  Lab 06/23/18 1108 06/23/18 1335  TROPONINI <0.03 <0.03   No results for input(s): TROPIPOC in the last 168 hours.   BNPNo results for input(s): BNP, PROBNP in the last 168 hours.   DDimer No results for input(s): DDIMER in the last 168 hours.   Radiology    Dg Chest 2 View  Result Date: 06/23/2018 CLINICAL DATA:  RIGHT anterior chest pain EXAM: CHEST - 2 VIEW COMPARISON:  04/22/2018 FINDINGS: Normal mediastinum and cardiac silhouette. Normal pulmonary vasculature. No evidence of effusion, infiltrate, or pneumothorax. No acute bony abnormality. IMPRESSION: No acute cardiopulmonary process. Electronically Signed   By: Genevive BiStewart  Edmunds M.D.   On: 06/23/2018 12:09    Cardiac Studies   Cardiac Catheterization: 04/2018  Mid RCA lesion is 95% stenosed.  Ost 1st Mrg to 1st Mrg lesion is 90%  stenosed.  Prox LAD lesion is 35% stenosed.  A drug-eluting stent was successfully placed using a STENT RESOLUTE ONYX 3.5X15.  Post intervention, there is a 0% residual stenosis.  A drug-eluting stent was successfully placed using a Christoval [id number].  Post intervention, there is a 0% residual stenosis.   1.  Severe stenosis of the mid RCA, treated successfully with PCI using a drug-eluting stent (3.5 x 15 mm resolute Onyx DES) 2.  Severe stenosis of the first OM branch of the circumflex, treated successfully with PCI using a drug-eluting stent (2.75 x 34 mm resolute Onyx DES) 3.  Widely patent left main 4.  Mild nonobstructive disease of the LAD 5.  Normal LV systolic function  Recommendations: Aggressive post MI medical therapy, if no early  complications arise, consider discharge home tomorrow morning.  Patient Profile     53 y.o. male w/ PMH of CAD (s/p NSTEMI in 04/2018 with DES to RCA and OM), HTN, HLD, Obesity, and OSA who presented to [facility] ED on 06/23/2018 for evaluation of worsening chest pain and dyspnea on exertion.   Assessment & Plan    1. Chest Pain with Known CAD - the patient a recent NSTEMI in 04/2018 with DES to RCA and OM but presented back with recurrent chest pain and dyspnea on exertion.  - Initial and delta troponin values have been negative. EKG shows no acute ischemic changes. Underwent Lexiscan Myoview this morning with no immediate complications. Official report pending following stress images.  - continue DAPT with ASA and Effient (intolerant to Brilinta) along with Lopressor and Atorvastatin 80mg  daily.   2. HTN - BP has been variable at 96/47 - 133/98 within the past 24 hours.  - continue Lopressor 25mg  BID and Enalapril 10mg  daily.   3. HLD - remains on Atorvastatin 80mg  daily and Zetia 10mg  daily.   For questions or updates, please contact [place] Please consult [email or web address] for contact info under Cardiology/STEMI.   Signed, Erma Heritage , PA-C 12:46 PM 06/24/2018 Pager: [phone]   I have seen, examined the patient, and reviewed the above assessment and plan.  Changes to above are made where necessary.  On exam, RRR.  Pt with chest pain, likely atypical.  CMs negative,  No ekg changes. myoview pending.  If low risk, medical management is advised.  If high risk, further CV testing may be required.  Co Sign: [name], MD 06/24/2018 1:35 PM

## 2018-06-24 NOTE — Discharge Summary (Addendum)
Physician Discharge Summary  Brandon [name] ZOX:[id number] DOB: March 11, 1965 DOA: 06/23/2018  PCP: Olive Bassough, Robert L, MD  Admit date: 06/23/2018 Discharge date: 06/24/2018  Admitted From: Home Disposition: Home  Recommendations for Outpatient Follow-up:  1. Follow up with PCP in 1 week 2. Follow up with cardiologist in 1 week 3. Please obtain BMP/CBC in one week 4. Please follow up on the following pending results: None  Home Health: None Equipment/Devices: None  Discharge Condition: Stable CODE STATUS: Full code Diet recommendation: Heart healthy   Brief/Interim Summary:  Chief Complaint: Chest pain  HPI: Brandon PanderLyle Ligman is a 53 y.o. male with medical history significant of NSTEMI/CAD status post PCI earlier this year, hypertension, hyperlipidemia.  Patient presented secondary to worsening substernal chest pain over the last 3 days initially he had more dyspnea on exertion which progressively worsened.  This morning, however, he reported some significant substernal squeezing chest pain with radiation to his left shoulder with associated dyspnea on exertion.  His chest pain would resolve when he ceased ambulation.  He reports that the symptoms were very similar to his symptoms when he had his myocardial infarction.  He reports calling his cardiologist who recommended for him to be evaluated in the emergency department.  He reports he scheduled stress test as an outpatient in early July.  He did not take any nitroglycerin for his pain.   Hospital course:  Chest pain Patient with anginal symptoms. EKG without significant changes and troponin negative so far. No pain at rest. Cardiology consulted. Nuclear stress test performed and low risk. CTA of the chest was significant for no PE or pulmonary etiology of pain. Will need to follow-up with outpatient cardiologist.  CAD Patient recently had an NSTEMI requiring LHC and PCI with DES of mid RCA and first OM branch of circumflex. Continue  Effient and aspirin  Hyperlipidemia Continue atorvastatin  Essential hypertension Continue metoprolol, enalapril  Discharge Diagnoses:  Principal Problem:   Chest pain Active Problems:   Essential hypertension   Mixed hyperlipidemia   CAD in native artery    Discharge Instructions  Discharge Instructions    Call MD for:  difficulty breathing, headache or visual disturbances   Complete by: As directed    Call MD for:  extreme fatigue   Complete by: As directed    Call MD for:  persistant dizziness or light-headedness   Complete by: As directed    Call MD for:  severe uncontrolled pain   Complete by: As directed    Diet - low sodium heart healthy   Complete by: As directed    Increase activity slowly   Complete by: As directed      Allergies as of 06/24/2018      Reactions   Brilinta [ticagrelor] Shortness Of Breath      Medication List    STOP taking these medications   acetaminophen 325 MG tablet Commonly known as: TYLENOL   naproxen sodium 220 MG tablet Commonly known as: ALEVE   penicillin v potassium 500 MG tablet Commonly known as: VEETID     TAKE these medications   aspirin 81 MG chewable tablet Chew 1 tablet (81 mg total) by mouth daily.   atorvastatin 80 MG tablet Commonly known as: LIPITOR Take 1 tablet (80 mg total) by mouth daily at 6 PM.   enalapril 10 MG tablet Commonly known as: VASOTEC Take 10 mg by mouth daily.   ezetimibe 10 MG tablet Commonly known as: ZETIA Take 1 tablet (10 mg  total) by mouth daily. What changed: when to take this   metoprolol tartrate 25 MG tablet Commonly known as: LOPRESSOR Take 1 tablet (25 mg total) by mouth 2 (two) times daily.   multivitamin with minerals Tabs tablet Take 1 tablet by mouth daily.   nitroGLYCERIN 0.4 MG SL tablet Commonly known as: Nitrostat Place 1 tablet (0.4 mg total) under the tongue every 5 (five) minutes as needed for chest pain.   prasugrel 10 MG Tabs tablet Commonly  known as: EFFIENT Take 10 mg by mouth daily.      Follow-up Information    Dough, Jaymes Graff, MD. Schedule an appointment as soon as possible for a visit in 1 week(s).   Specialty: Family Medicine Contact information: [place] [address] [phone]        Richardo Priest, MD. Schedule an appointment as soon as possible for a visit in 1 week(s).   Specialty: Cardiology Contact information: [place] [address] [phone]          Allergies  Allergen Reactions   Brilinta [Ticagrelor] Shortness Of Breath    Consultations:  Cardiology   Procedures/Studies: Dg Chest 2 View  Result Date: 06/23/2018 CLINICAL DATA:  RIGHT anterior chest pain EXAM: CHEST - 2 VIEW COMPARISON:  04/22/2018 FINDINGS: Normal mediastinum and cardiac silhouette. Normal pulmonary vasculature. No evidence of effusion, infiltrate, or pneumothorax. No acute bony abnormality. IMPRESSION: No acute cardiopulmonary process. Electronically Signed   By: Suzy Bouchard M.D.   On: 06/23/2018 12:09   Ct Angio Chest Pe W Or Wo Contrast  Result Date: 06/24/2018 CLINICAL DATA:  Chest pain.  Increased dyspnea EXAM: CT ANGIOGRAPHY CHEST WITH CONTRAST TECHNIQUE: Multidetector CT imaging of the chest was performed using the standard protocol during bolus administration of intravenous contrast. Multiplanar CT image reconstructions and MIPs were obtained to evaluate the vascular anatomy. CONTRAST:  68mL OMNIPAQUE IOHEXOL 350 MG/ML SOLN COMPARISON:  None. FINDINGS: Cardiovascular: No filling defects within the pulmonary arteries to suggest acute pulmonary embolism. No acute findings of the aorta or great vessels. No pericardial fluid. Coronary artery calcification and aortic atherosclerotic calcification. Mediastinum/Nodes: No axillary or supraclavicular adenopathy. No mediastinal hilar adenopathy. No pericardial effusion. Esophagus normal. Lungs/Pleura: No pulmonary infarction. No infiltrate.  No pleural fluid or pneumothorax. Airways normal Upper Abdomen: Limited view of the liver, kidneys, pancreas are unremarkable. Normal adrenal glands. Post gastric sleeve bariatric surgery noted. Musculoskeletal: No aggressive osseous lesion Review of the MIP images confirms the above findings. IMPRESSION: 1. No acute pulmonary embolism. 2. No acute pulmonary parenchymal findings. 3. Coronary artery calcification and Aortic Atherosclerosis (ICD10-I70.0). Electronically Signed   By: Suzy Bouchard M.D.   On: 06/24/2018 19:03   Nm Myocar Multi W/spect W/wall Motion / Ef  Result Date: 06/24/2018 CLINICAL DATA:  53 year old male with myocardial infarction and chest pain. EXAM: MYOCARDIAL IMAGING WITH SPECT (REST AND PHARMACOLOGIC-STRESS) GATED LEFT VENTRICULAR WALL MOTION STUDY LEFT VENTRICULAR EJECTION FRACTION TECHNIQUE: Standard myocardial SPECT imaging was performed after resting intravenous injection of 10 mCi Tc-29m tetrofosmin. Subsequently, intravenous infusion of Lexiscan was performed under the supervision of the Cardiology staff. At peak effect of the drug, 30 mCi Tc-18m tetrofosmin was injected intravenously and standard myocardial SPECT imaging was performed. Quantitative gated imaging was also performed to evaluate left ventricular wall motion, and estimate left ventricular ejection fraction. COMPARISON:  None. FINDINGS: Perfusion: Decreased counts within the apical segment on rest and stress imaging. A decrease counts in the inferolateral wall on rest and  stress imaging. No evidence reversible ischemia Wall Motion: Mild LEFT ventricular dilatation. No focal wall motion abnormality. Left Ventricular Ejection Fraction: 56 % End diastolic volume 150 ml End systolic volume 66 ml IMPRESSION: 1. No evidence reversible ischemia. Fixed defects in the apical segment and inferolateral wall concerning for scar. 2. LEFT ventricular dilatation.  No focal wall motion abnormality. 3. Left ventricular ejection  fraction 56% 4. Non invasive risk stratification*: Low *2012 Appropriate Use Criteria for Coronary Revascularization Focused Update: J Am Coll Cardiol. 2012;59(9):857-881. http://content.dementiazones.comonlinejacc.org/article.aspx?articleid=1201161 Electronically Signed   By: Genevive BiStewart  Edmunds M.D.   On: 06/24/2018 14:44      Subjective: Chest pain with ambulation.   Discharge Exam: Vitals:   06/24/18 1245 06/24/18 1651  BP: 114/80 111/68  Pulse:  63  Resp:    Temp:  98.3 F (36.8 C)  SpO2:  97%   Vitals:   06/24/18 1241 06/24/18 1243 06/24/18 1245 06/24/18 1651  BP: (!) 121/98 115/87 114/80 111/68  Pulse:    63  Resp:      Temp:    98.3 F (36.8 C)  TempSrc:    Oral  SpO2:    97%  Weight:      Height:        General: Pt is alert, awake, not in acute distress Cardiovascular: RRR, S1/S2 +, no rubs, no gallops Respiratory: CTA bilaterally, no wheezing, no rhonchi Abdominal: Soft, NT, ND, bowel sounds + Extremities: no edema, no cyanosis    The results of significant diagnostics from this hospitalization (including imaging, microbiology, ancillary and laboratory) are listed below for reference.     Microbiology: Recent Results (from the past 240 hour(s))  SARS Coronavirus 2 (Hosp order,Performed in [facility] lab via [employer] ID)     Status: None   Collection Time: 06/23/18 12:48 PM   Specimen: Dry Nasal Swab (A[employer] ID Now)  Result Value Ref Range Status   SARS Coronavirus 2 (Abbott ID Now) NEGATIVE NEGATIVE Final    Comment: (NOTE) Interpretive Result Comment(s): COVID 19 Positive SARS CoV 2 target nucleic acids are DETECTED. The SARS CoV 2 RNA is generally detectable in upper and lower respiratory specimens during the acute phase of infection.  Positive results are indicative of active infection with SARS CoV 2.  Clinical correlation with patient history and other diagnostic information is necessary to determine patient infection status.  Positive results do not rule out  bacterial infection or coinfection with other viruses. The expected result is Negative. COVID 19 Negative SARS CoV 2 target nucleic acids are NOT DETECTED. The SARS CoV 2 RNA is generally detectable in upper and lower respiratory specimens during the acute phase of infection.  Negative results do not preclude SARS CoV 2 infection, do not rule out coinfections with other pathogens, and should not be used as the sole basis for treatment or other patient management decisions.  Negative results must be combined with clinical  observations, patient history, and epidemiological information. The expected result is Negative. Invalid Presence or absence of SARS CoV 2 nucleic acids cannot be determined. Repeat testing was performed on the submitted specimen and repeated Invalid results were obtained.  If clinically indicated, additional testing on a new specimen with an alternate test methodology [phone]([id number]) is advised.  The SARS CoV 2 RNA is generally detectable in upper and lower respiratory specimens during the acute phase of infection. The expected result is Negative. Fact Sheet for Patients:  http://www.graves-ford.org/https://www.fda.gov/media/136524/download Fact Sheet for Healthcare Providers: EnviroConcern.sihttps://www.fda.gov/media/136523/download This test is not yet approved  or cleared by the [place] and has been authorized for detection and/or diagnosis of SARS CoV 2 by FDA under an Emergency Use Authorization (EUA).  This EUA will remain in effect (meaning this test can be used) for the duration of the COVID19 d eclaration under Section 564(b)(1) of the Act, 21 U.S.C. section 873 008 6098 3(b)(1), unless the authorization is terminated or revoked sooner. Performed at [facility], [address] [place], [place] [address]   SARS Coronavirus 2     Status: None   Collection Time: 06/23/18  6:51 PM  Result Value Ref Range Status   SARS Coronavirus 2 NOT DETECTED NOT DETECTED Final    Comment:  (NOTE) SARS-CoV-2 target nucleic acids are NOT DETECTED. The SARS-CoV-2 RNA is generally detectable in upper and lower respiratory specimens during the acute phase of infection.  Negative  results do not preclude SARS-CoV-2 infection, do not rule out co-infections with other pathogens, and should not be used as the sole basis for treatment or other patient management decisions.  Negative results must be combined with clinical observations, patient history, and epidemiological information. The expected result is Not Detected. Fact Sheet for Patients: http://www.biofiredefense.com/wp-content/uploads/2020/03/BIOFIRE-COVID -19-patients.pdf Fact Sheet for Healthcare Providers: http://www.biofiredefense.com/wp-content/uploads/2020/03/BIOFIRE-COVID -19-hcp.pdf This test is not yet approved or cleared by the [place] and  has been authorized for detection and/or diagnosis of SARS-CoV-2 by FDA under an Emergency Use Authorization (EUA).  This EUA will remain in effec t (meaning this test can be used) for the duration of  the COVID-19 declaration under Section 564(b)(1) of the Act, 21 U.S.C. section 360bbb-3(b)(1), unless the authorization is terminated or revoked sooner. Performed at [facility], [address] [place], [place] [address]      Labs: BNP (last 3 results) No results for input(s): BNP in the last 8760 hours. Basic Metabolic Panel: Recent Labs  Lab 06/23/18 1108  NA 137  K 4.2  CL 104  CO2 26  GLUCOSE 132*  BUN 16  CREATININE 1.06  CALCIUM 9.2   Liver Function Tests: No results for input(s): AST, ALT, ALKPHOS, BILITOT, PROT, ALBUMIN in the last 168 hours. No results for input(s): LIPASE, AMYLASE in the last 168 hours. No results for input(s): AMMONIA in the last 168 hours. CBC: Recent Labs  Lab 06/23/18 1108  WBC 6.5  HGB 15.5  HCT 47.6  MCV 96.7  PLT 252   Cardiac Enzymes: Recent Labs  Lab 06/23/18 1108 06/23/18 1335  TROPONINI  <0.03 <0.03   BNP: Invalid input(s): POCBNP CBG: No results for input(s): GLUCAP in the last 168 hours. D-Dimer Recent Labs    06/24/18 1548  DDIMER 0.61*   Hgb A1c No results for input(s): HGBA1C in the last 72 hours. Lipid Profile No results for input(s): CHOL, HDL, LDLCALC, TRIG, CHOLHDL, LDLDIRECT in the last 72 hours. Thyroid function studies No results for input(s): TSH, T4TOTAL, T3FREE, THYROIDAB in the last 72 hours.  Invalid input(s): FREET3 Anemia work up No results for input(s): VITAMINB12, FOLATE, FERRITIN, TIBC, IRON, RETICCTPCT in the last 72 hours. Urinalysis    Component Value Date/Time   BILIRUBINUR negative 09/15/2015 1439   KETONESUR negative 09/15/2015 1439   PROTEINUR negative 09/15/2015 1439   UROBILINOGEN 0.2 09/15/2015 1439   NITRITE Negative 09/15/2015 1439   LEUKOCYTESUR Negative 09/15/2015 1439   Sepsis Labs Invalid input(s): PROCALCITONIN,  WBC,  LACTICIDVEN Microbiology Recent Results (from the past 240 hour(s))  SARS Coronavirus 2 (Hosp order,Performed in [facility] lab  via [employer] ID)     Status: None   Collection Time: 06/23/18 12:48 PM   Specimen: Dry Nasal Swab (Abbott ID Now)  Result Value Ref Range Status   SARS Coronavirus 2 (Abbott ID Now) NEGATIVE NEGATIVE Final    Comment: (NOTE) Interpretive Result Comment(s): COVID 19 Positive SARS CoV 2 target nucleic acids are DETECTED. The SARS CoV 2 RNA is generally detectable in upper and lower respiratory specimens during the acute phase of infection.  Positive results are indicative of active infection with SARS CoV 2.  Clinical correlation with patient history and other diagnostic information is necessary to determine patient infection status.  Positive results do not rule out bacterial infection or coinfection with other viruses. The expected result is Negative. COVID 19 Negative SARS CoV 2 target nucleic acids are NOT DETECTED. The SARS CoV 2 RNA is generally detectable in upper  and lower respiratory specimens during the acute phase of infection.  Negative results do not preclude SARS CoV 2 infection, do not rule out coinfections with other pathogens, and should not be used as the sole basis for treatment or other patient management decisions.  Negative results must be combined with clinical  observations, patient history, and epidemiological information. The expected result is Negative. Invalid Presence or absence of SARS CoV 2 nucleic acids cannot be determined. Repeat testing was performed on the submitted specimen and repeated Invalid results were obtained.  If clinically indicated, additional testing on a new specimen with an alternate test methodology [phone]) is advised.  The SARS CoV 2 RNA is generally detectable in upper and lower respiratory specimens during the acute phase of infection. The expected result is Negative. Fact Sheet for Patients:  http://www.graves-ford.org/https://www.fda.gov/media/136524/download Fact Sheet for Healthcare Providers: EnviroConcern.sihttps://www.fda.gov/media/136523/download This test is not yet approved or cleared by the [place] FDA and has been authorized for detection and/or diagnosis of SARS CoV 2 by FDA under an Emergency Use Authorization (EUA).  This EUA will remain in effect (meaning this test can be used) for the duration of the COVID19 d eclaration under Section 564(b)(1) of the Act, 21 U.S.C. section 8432433939360bbb 3(b)(1), unless the authorization is terminated or revoked sooner. Performed at [facility], [address] [place], [place] [address]   SARS Coronavirus 2     Status: None   Collection Time: 06/23/18  6:51 PM  Result Value Ref Range Status   SARS Coronavirus 2 NOT DETECTED NOT DETECTED Final    Comment: (NOTE) SARS-CoV-2 target nucleic acids are NOT DETECTED. The SARS-CoV-2 RNA is generally detectable in upper and lower respiratory specimens during the acute phase of infection.  Negative  results do not preclude  SARS-CoV-2 infection, do not rule out co-infections with other pathogens, and should not be used as the sole basis for treatment or other patient management decisions.  Negative results must be combined with clinical observations, patient history, and epidemiological information. The expected result is Not Detected. Fact Sheet for Patients: http://www.biofiredefense.com/wp-content/uploads/2020/03/BIOFIRE-COVID -19-patients.pdf Fact Sheet for Healthcare Providers: http://www.biofiredefense.com/wp-content/uploads/2020/03/BIOFIRE-COVID -19-hcp.pdf This test is not yet approved or cleared by the [place] FDA and  has been authorized for detection and/or diagnosis of SARS-CoV-2 by FDA under an Emergency Use Authorization (EUA).  This EUA will remain in effec t (meaning this test can be used) for the duration of  the COVID-19 declaration under Section 564(b)(1) of the Act, 21 U.S.C. section 360bbb-3(b)(1), unless the authorization is terminated or revoked sooner. Performed at [facility], [address]  [place], [place] [address]      SIGNED:   Jacquelin Hawking, MD [facility] 06/24/2018, 7:51 PM

## 2018-06-24 NOTE — Progress Notes (Signed)
   Stress test showed:  IMPRESSION: 1. No evidence reversible ischemia. Fixed defects in the apical segment and inferolateral wall concerning for scar.  2. LEFT ventricular dilatation.  No focal wall motion abnormality.  3. Left ventricular ejection fraction 56%  4. Non invasive risk stratification*: Low   Updated patient with results. Admitting team made aware.   Signed, Erma Heritage, PA-C 06/24/2018, 3:01 PM Pager: [phone]

## 2018-06-24 NOTE — Progress Notes (Signed)
SATURATION QUALIFICATIONS: (This note is used to comply with regulatory documentation for home oxygen)  Patient Saturations on Room Air at Rest = 95%  Patient Saturations on Room Air while Ambulating = 97%  Mobilized throughout the unit. Did not desaturate or require supplemental oxygen. Denies CP, palpations, or SOB.

## 2018-06-24 NOTE — Plan of Care (Signed)
Patient discharged. Patient taken out in a wheel chair accompanied by NT. No S/S of SOB or  he c/o  pain. Went over his discharge instructions. Patient verbalized understanding.

## 2018-06-24 NOTE — Discharge Instructions (Signed)
Brandon [name],  You were in the hospital because of trouble breathing and chest pain. This may be related to your heart disease but no evidence of recurrent oxygen issues or concern for likely damage to your heart. There was also no reason found for your symptoms on the CT scan of your chest. Most importantly, no blood clots or pneumonia.

## 2018-06-25 NOTE — Progress Notes (Signed)
Virtual Visit via Video Note   This visit type was conducted due to national recommendations for restrictions regarding the COVID-19 Pandemic (e.g. social distancing) in an effort to limit this patient's exposure and mitigate transmission in our community.  Due to his co-morbid illnesses, this patient is at least at moderate risk for complications without adequate follow up.  This format is felt to be most appropriate for this patient at this time.  All issues noted in this document were discussed and addressed.  A limited physical exam was performed with this format.  Please refer to the patient's chart for his consent to telehealth for [facility].   Date:  06/26/2018   ID:  Brandon [name], DOB 15-Dec-1965, MRN [id number]  Patient Location: Home Provider Location: Office  PCP:  Algis Greenhouse, MD  Cardiologist:  Shirlee More, MD  Electrophysiologist:  None   Evaluation Performed:  Follow-Up Visit  Chief Complaint:  Chest pain  History of Present Illness:    Brandon [name] is a 53 y.o. male with a hx of morbid obesity hypertension hyperlipidemia CAD with non-ST elevation MI and PCI and stent mid right coronary artery and marginal branch 04/23/2018.  He was last seen yesterday at [facility] during an admission with chest pain.  Prior to discharge he underwent myocardial perfusion study and his troponins were normal.  Myoview study was low risk EF 56% fixed defect in the apical segment inferolateral wall and no ischemia.  Chest CTA showed no evidence of pulmonary embolism or lung abnormality.He has a COVID 19 exposure and normal PCR.  He is now about 14 days remote from his exposure and has no fever chills cough or shortness of breath.  He continues to have atypical nonexertional fleeting chest pain presently is in his right chest unrelated to activity and relieved with rest unrelieved with posture and there is no radiation associated with it it just waxes and wanes and is been  there for days the only significant symptom is at its worse with eating he has a history of heartburn in the past no known ulcer disease and has had bariatric surgery.  I told him knowing that he had no evidence of acute coronary syndrome had a normal myocardial perfusion study and a CT of his chest I suspect this is GI in etiology after discussion of options he will elect to begin a PPI and do an ultrasound looking for gallstones.  I will see him in the office in 2 weeks follow-up The patient does not have symptoms concerning for COVID-19 infection (fever, chills, cough, or new shortness of breath).    Past Medical History:  Diagnosis Date  . ACS (acute coronary syndrome) ([place]) 04/2018  . Acute coronary syndrome ([place]) 04/23/2018  . Arthritis    both knees  . Arthritis of knee, degenerative 05/14/2015  . CAD in native artery 05/06/2018  . Chronic right shoulder pain 05/28/2015  . Complication of anesthesia    " i HAVE A HARD TIME WAKING UP "  . Coronary artery disease   . Essential hypertension 05/14/2015  . Hypertension   . Mixed hyperlipidemia 05/14/2015  . Non-ST elevation (NSTEMI) myocardial infarction ([place])   . Pre-diabetes 03/30/2016   2017: x/6.7 2018: 103/6.4  . Right elbow pain 05/28/2015   Past Surgical History:  Procedure Laterality Date  . CORONARY STENT INTERVENTION N/A 04/23/2018   Procedure: CORONARY STENT INTERVENTION;  Surgeon: Sherren Mocha, MD;  Location: [facility];  Service: Cardiovascular;  Laterality: N/A;  . EXCISION HAGLUND'S DEFORMITY WITH ACHILLES TENDON REPAIR Right 05/22/2014   Procedure: RIGHT ACHILLES DEBRIDEMENT; HAGLUND'S EXCISION ;  Surgeon: Toni ArthursJohn Hewitt, MD;  Location: [facility];  Service: Orthopedics;  Laterality: Right;  . GASTROCNEMIUS RECESSION Right 05/22/2014   Procedure: GASTROC RECESSION ;  Surgeon: Toni ArthursJohn Hewitt, MD;  Location: [facility];  Service: Orthopedics;  Laterality: Right;  . KNEE SURGERY     l/knee  .  LAPAROSCOPIC GASTRIC SLEEVE RESECTION    . LEFT HEART CATH AND CORONARY ANGIOGRAPHY N/A 04/23/2018   Procedure: LEFT HEART CATH AND CORONARY ANGIOGRAPHY;  Surgeon: Tonny Bollmanooper, Michael, MD;  [facility];  Service: Cardiovascular;  Laterality: N/A;  . MOUTH SURGERY     Root canal     No outpatient medications have been marked as taking for the 06/26/18 encounter (Appointment) with Baldo DaubMunley, Varnell Orvis J, MD.     Allergies:   Brilinta [ticagrelor]   Social History   Tobacco Use  . Smoking status: Former Smoker    Packs/day: 2.00    Years: 10.00    Pack years: 20.00    Types: Cigarettes  . Smokeless tobacco: Never Used  . Tobacco comment: Quti smoking 20 years ago  Substance Use Topics  . Alcohol use: Yes    Comment: occ  . Drug use: No     Family Hx: The patient's family history includes Cancer in his mother; Heart disease (age of onset: 7852) in his father; Hypertension in his father.  ROS:   Please see the history of present illness.     All other systems reviewed and are negative.   Prior CV studies:   The following studies were reviewed today:  1.  Severe stenosis of the mid RCA, treated successfully with PCI using a drug-eluting stent (3.5 x 15 mm resolute Onyx DES) 2.  Severe stenosis of the first OM branch of the circumflex, treated successfully with PCI using a drug-eluting stent (2.75 x 34 mm resolute Onyx DES) 3.  Widely patent left main 4.  Mild nonobstructive disease of the LAD 5.  Normal LV systolic function   Labs/Other Tests and Data Reviewed:    EKG:  An ECG dated 06/23/18 was personally reviewed today and demonstrated:  SRTH LPFB otherwise normal  Recent Labs: 04/23/2018: Magnesium 1.9; TSH 2.850 05/15/2018: ALT 44; NT-Pro BNP 95 06/23/2018: BUN 16; Creatinine, Ser 1.06; Hemoglobin 15.5; Platelets 252; Potassium 4.2; Sodium 137   Recent Lipid Panel Lab Results  Component Value Date/Time   CHOL 124 06/12/2018 08:35 AM   TRIG 134 06/12/2018 08:35  AM   HDL 44 06/12/2018 08:35 AM   CHOLHDL 2.8 06/12/2018 08:35 AM   CHOLHDL 4.0 04/23/2018 03:47 PM   LDLCALC 53 06/12/2018 08:35 AM    Wt Readings from Last 3 Encounters:  06/23/18 (!) 348 lb 3.2 oz (157.9 kg)  06/15/18 (!) 350 lb (158.8 kg)  05/16/18 (!) 348 lb 9.6 oz (158.1 kg)     Objective:    Vital Signs:  There were no vitals taken for this visit.   VITAL SIGNS:  reviewed GEN:  no acute distress EYES:  sclerae anicteric, EOMI - Extraocular Movements Intact RESPIRATORY:  normal respiratory effort, symmetric expansion CARDIOVASCULAR:  no peripheral edema SKIN:  no rash, lesions or ulcers. MUSCULOSKELETAL:  no obvious deformities. NEURO:  alert and oriented x 3, no obvious focal deficit PSYCH:  normal affect  ASSESSMENT & PLAN:    1. Coronary artery disease stable continue  his dual antiplatelet therapy beta-blocker and combined lipid-lowering treatment with a high intensity statin and Zetia.  Recent admission reviewed no evidence of acute coronary syndrome myocardial perfusion study and EKGs showed no ischemia and it appears as if his symptoms are GI in etiology.  After discussion of options we formulated a treatment plan to initiate a PPI do an ultrasound of his gallbladder if stones are detected would need to consider cholecystectomy if normal I would favor endoscopy.  At this time I would not advise coronary angiography 2. Hypertension stable at target continue treatment including ACE inhibitor and beta-blocker with CAD 3. Hyperlipidemia stable continue combined Zetia and high intensity statin 4. Chest pain quite atypical suggestive of GI etiology we will treat for acid reflux check ultrasound of gallbladder and if these are normal I favor referral to GI for endoscopy  COVID-19 Education: The signs and symptoms of COVID-19 were discussed with the patient and how to seek care for testing (follow up with PCP or arrange E-visit).   The importance of social distancing was  discussed today.  Time:   Today, I have spent 25 minutes with the patient with telehealth technology discussing the above problems.     Medication Adjustments/Labs and Tests Ordered: Current medicines are reviewed at length with the patient today.  Concerns regarding medicines are outlined above.   Tests Ordered: No orders of the defined types were placed in this encounter.   Medication Changes: No orders of the defined types were placed in this encounter.   Follow Up:  In Person in 2 week(s)  Signed, Norman HerrlichBrian Cozy Veale, MD  06/26/2018 1:28 PM    [facility]

## 2018-06-26 ENCOUNTER — Other Ambulatory Visit: Payer: Self-pay

## 2018-06-26 ENCOUNTER — Telehealth (INDEPENDENT_AMBULATORY_CARE_PROVIDER_SITE_OTHER): Payer: BC Managed Care – PPO | Admitting: Cardiology

## 2018-06-26 ENCOUNTER — Encounter: Payer: Self-pay | Admitting: Cardiology

## 2018-06-26 VITALS — BP 114/63 | HR 63 | Wt 345.0 lb

## 2018-06-26 DIAGNOSIS — R079 Chest pain, unspecified: Secondary | ICD-10-CM

## 2018-06-26 DIAGNOSIS — E782 Mixed hyperlipidemia: Secondary | ICD-10-CM | POA: Diagnosis not present

## 2018-06-26 DIAGNOSIS — I10 Essential (primary) hypertension: Secondary | ICD-10-CM | POA: Diagnosis not present

## 2018-06-26 DIAGNOSIS — I2510 Atherosclerotic heart disease of native coronary artery without angina pectoris: Secondary | ICD-10-CM

## 2018-06-26 MED ORDER — PANTOPRAZOLE SODIUM 40 MG PO TBEC: 40.0000 mg | DELAYED_RELEASE_TABLET | Freq: Every day | ORAL | 11 refills | Status: DC

## 2018-06-26 NOTE — Addendum Note (Signed)
Addended by: Particia Nearing B on: 06/26/2018 04:22 PM   Modules accepted: Orders

## 2018-06-26 NOTE — Patient Instructions (Addendum)
Medication Instructions:  Your physician has recommended you make the following change in your medication:   START: Protonix 40 mg (1 Tab) daily  If you need a refill on your cardiac medications before your next appointment, please call your pharmacy.   Lab work: None If you have labs (blood work) drawn today and your tests are completely normal, you will receive your results only by: Marland Kitchen MyChart Message (if you have MyChart) OR . A paper copy in the mail If you have any lab test that is abnormal or we need to change your treatment, we will call you to review the results.  Testing/Procedures: YOU ARE SCHEDULED FOR AN ABDOMINAL ULTRASOUND TO LOOK AT YOUR GALLBLADDER ON June 18 ,2020 at [place]: At [facility], you and your health needs are our priority.  As part of our continuing mission to provide you with exceptional heart care, we have created designated Provider Care Teams.  These Care Teams include your primary Cardiologist (physician) and Advanced Practice Providers (APPs -  Physician Assistants and Nurse Practitioners) who all work together to provide you with the care you need, when you need it. You will need a follow up appointment in 4 weeks.  Any Other Special Instructions Will Be Listed Below (If Applicable).

## 2018-06-28 ENCOUNTER — Ambulatory Visit (HOSPITAL_BASED_OUTPATIENT_CLINIC_OR_DEPARTMENT_OTHER)
Admission: RE | Admit: 2018-06-28 | Discharge: 2018-06-28 | Disposition: A | Discharge status: Home / Self Care | Payer: BC Managed Care – PPO | Source: Ambulatory Visit | Attending: Cardiology | Admitting: Cardiology

## 2018-06-28 ENCOUNTER — Other Ambulatory Visit: Payer: Self-pay

## 2018-06-28 DIAGNOSIS — R079 Chest pain, unspecified: Secondary | ICD-10-CM | POA: Diagnosis present

## 2018-07-02 ENCOUNTER — Telehealth: Payer: Self-pay

## 2018-07-02 NOTE — Telephone Encounter (Signed)
Patient aware of appointment date and time for consult with Dr Orrin Brigham on 07-04-2018 arrive at 1100 for 1130 appointment.  Patient given [place].   Patient agreed to plan and verbalized understanding.

## 2018-07-04 ENCOUNTER — Telehealth: Payer: Self-pay | Admitting: Cardiology

## 2018-07-04 NOTE — Telephone Encounter (Signed)
The patient has symptomatic cholelithiasis and recent hospitalization.  He is more than 2 months remote from his PCI and stent.  Dr. Lilia Pro I discussed the case he can relieve the patient on aspirin withdraw Effient for 5 days and resume 24 to 48 hours after surgery.

## 2018-07-17 ENCOUNTER — Ambulatory Visit: Payer: BLUE CROSS/BLUE SHIELD

## 2018-07-18 ENCOUNTER — Ambulatory Visit: Payer: BLUE CROSS/BLUE SHIELD

## 2018-07-20 ENCOUNTER — Ambulatory Visit: Payer: BC Managed Care – PPO | Admitting: Cardiology

## 2018-07-26 ENCOUNTER — Ambulatory Visit: Payer: BLUE CROSS/BLUE SHIELD | Admitting: Cardiology

## 2018-07-27 ENCOUNTER — Other Ambulatory Visit: Payer: Self-pay

## 2018-07-27 ENCOUNTER — Ambulatory Visit (INDEPENDENT_AMBULATORY_CARE_PROVIDER_SITE_OTHER): Payer: Self-pay | Admitting: Cardiology

## 2018-07-27 ENCOUNTER — Ambulatory Visit: Payer: BC Managed Care – PPO | Admitting: Cardiology

## 2018-07-27 ENCOUNTER — Encounter: Payer: Self-pay | Admitting: Cardiology

## 2018-07-27 VITALS — BP 132/78 | HR 76 | Temp 99.3°F | Ht 74.0 in | Wt 359.2 lb

## 2018-07-27 DIAGNOSIS — I10 Essential (primary) hypertension: Secondary | ICD-10-CM

## 2018-07-27 DIAGNOSIS — E782 Mixed hyperlipidemia: Secondary | ICD-10-CM

## 2018-07-27 DIAGNOSIS — K8020 Calculus of gallbladder without cholecystitis without obstruction: Secondary | ICD-10-CM

## 2018-07-27 DIAGNOSIS — I2510 Atherosclerotic heart disease of native coronary artery without angina pectoris: Secondary | ICD-10-CM

## 2018-07-27 NOTE — Progress Notes (Signed)
Cardiology Office Note:    Date:  07/27/2018   ID:  Brandon [name], DOB 1965/09/18, MRN [id number]  PCP:  Olive Bassough, Robert L, MD  Cardiologist:  Norman HerrlichBrian Shykeria Sakamoto, MD    Referring MD: Olive Bassough, Robert L, MD    ASSESSMENT:    1. CAD in native artery   2. Essential hypertension   3. Mixed hyperlipidemia   4. Calculus of gallbladder without cholecystitis without obstruction    PLAN:    In order of problems listed above:  1. CAD - Stent to RCA and marginal branch 04/23/18. GDMT: aspirin, beta blocker, high intensity statin. DAPT: aspirin and effient.  Stable CAD appropriate to withdraw his Effient perform elective cholecystectomy and then resume dual antiplatelet therapy. 2. Cholelithiasis - Noted on abdominal US 06/28/18. Likely etiology of previous ED visit for chest pain. Referred to surgery for cholecystectomy as he is symptomatic. Continue PPI. 3. HTN -stable at target continue treatment including ACE 4. HLD - Continue Zetia and high intensity statin.  His lipids are ideal with an LDL of under 55   Next appointment: 3 months   Medication Adjustments/Labs and Tests Ordered: Current medicines are reviewed at length with the patient today.  Concerns regarding medicines are outlined above.  No orders of the defined types were placed in this encounter.  No orders of the defined types were placed in this encounter.   No chief complaint on file. Chief Complaint: 53 yo male presents for follow up of chest pain and galbladder ultrasound.   History of Present Illness:    Brandon PanderLyle [name] is a 53 y.o. male with a hx of morbid obesity s/p gastric bypass, HTN, HLD, CAD with NSTEMI and PCI stent RCA and marginal branch (04/23/18). Last seen by me 06/25/18 after visit to the ED 06/23/18 for chest pain with negative troponing, Myoview low risk EF 56%, fixed defect in the apical segment inferolateral wall and no ischemia with negative chest CTA and negative COVID test. His chest pain is on the right side  worse with eating. Started on PPI and underwent galbladder ultrasound which showed cholelithiasis. He was referred to Dr. Marvis MoellerJames Morgan for cholecytectomy.   US 06/29/2018:IMPRESSION: 1. Multiple gallstones. No gallbladder wall thickening or biliary distention. 2. Increased hepatic echogenicity consistent fatty infiltration and/or hepatocellular disease. 3.  Incidental note made of right renal cysts, most likely benign.  Compliance with diet, lifestyle and medications: Yes  Is scheduled for cholecystectomy with symptomatic cholelithiasis he continues to have episodic right upper right chest pain which has radiated to her shoulder.  I discussed the case with both surgery and anesthesia as he is symptomatic he has had 1 admission and has documented gallstones I think it is appropriate to undergo elective intervention he will hold his Effient 5 days pre-and 1 to 2 days after and is been continued on aspirin.  We will do an EKG in the office today in the hand carry a copy with him he has had no typical angina dyspnea palpitation or syncope. Past Medical History:  Diagnosis Date   ACS (acute coronary syndrome) (H[facility]) 04/2018   Acute coronary syndrome ([facility]) 04/23/2018   Arthritis    both knees   Arthritis of knee, degenerative 05/14/2015   CAD in native artery 05/06/2018   Chronic right shoulder pain 05/28/2015   Complication of anesthesia    " i HAVE A HARD TIME WAKING UP "   Coronary artery disease    Essential hypertension 05/14/2015   Hypertension    Mixed  hyperlipidemia 05/14/2015   Non-ST elevation (NSTEMI) myocardial infarction ([place])    Pre-diabetes 03/30/2016   2017: x/6.7 2018: 103/6.4   Right elbow pain 05/28/2015    Past Surgical History:  Procedure Laterality Date   CORONARY STENT INTERVENTION N/A 04/23/2018   Procedure: CORONARY STENT INTERVENTION;  Surgeon: Sherren Mocha, MD;  Location: [facility];  Service: Cardiovascular;  Laterality: N/A;   EXCISION  HAGLUND'S DEFORMITY WITH ACHILLES TENDON REPAIR Right 05/22/2014   Procedure: RIGHT ACHILLES DEBRIDEMENT; HAGLUND'S EXCISION ;  Surgeon: Wylene Simmer, MD;  Location: [place];  Service: Orthopedics;  Laterality: Right;   GASTROCNEMIUS RECESSION Right 05/22/2014   Procedure: GASTROC RECESSION ;  Surgeon: Wylene Simmer, MD;  Location: [place] [facility];  Service: Orthopedics;  Laterality: Right;   KNEE SURGERY     l/knee   LAPAROSCOPIC GASTRIC SLEEVE RESECTION     LEFT HEART CATH AND CORONARY ANGIOGRAPHY N/A 04/23/2018   Procedure: LEFT HEART CATH AND CORONARY ANGIOGRAPHY;  Surgeon: Sherren Mocha, MD;  Location: [facility];  Service: Cardiovascular;  Laterality: N/A;   MOUTH SURGERY     Root canal    Current Medications: No outpatient medications have been marked as taking for the 07/27/18 encounter (Appointment) with Richardo Priest, MD.     Allergies:   Brilinta [ticagrelor]   Social History   Socioeconomic History   Marital status: Married    Spouse name: Not on file   Number of children: Not on file   Years of education: Not on file   Highest education level: Not on file  Occupational History   Not on file  Social Needs   Financial resource strain: Not on file   Food insecurity    Worry: Not on file    Inability: Not on file   Transportation needs    Medical: Not on file    Non-medical: Not on file  Tobacco Use   Smoking status: Former Smoker    Packs/day: 2.00    Years: 10.00    Pack years: 20.00    Types: Cigarettes   Smokeless tobacco: Never Used   Tobacco comment: Quti smoking 20 years ago  Substance and Sexual Activity   Alcohol use: Yes    Comment: occ   Drug use: No   Sexual activity: Yes  Lifestyle   Physical activity    Days per week: Not on file    Minutes per session: Not on file   Stress: Not on file  Relationships   Social connections    Talks on phone: Not on file    Gets together: Not on file      Attends religious service: Not on file    Active member of club or organization: Not on file    Attends meetings of clubs or organizations: Not on file    Relationship status: Not on file  Other Topics Concern   Not on file  Social History Narrative   Not on file     Family History: The patient's family history includes Cancer in his mother; Heart disease (age of onset: 73) in his father; Hypertension in his father. ROS:   Please see the history of present illness.    All other systems reviewed and are negative.  EKGs/Labs/Other Studies Reviewed:    The following studies were reviewed today: [place] ABD Limited RUQ 1. Multiple gallstones. No gallbladder wall thickening or biliary distention. 2. Increased hepatic echogenicity consistent fatty infiltration and/or hepatocellular disease. 3.  Incidental note made of right renal cysts, most likely benign.  CTA Chest No PE. No acute pulmonary findings.  Cardiovascular: No filling defects within the pulmonary arteries to suggest acute pulmonary embolism. No acute findings of the aorta or great vessels. No pericardial fluid. Coronary artery calcification and aortic atherosclerotic Calcification.  Lexiscan 06/24/18 IMPRESSION: 1. No evidence reversible ischemia. Fixed defects in the apical segment and inferolateral wall concerning for scar. 2. LEFT ventricular dilatation.  No focal wall motion abnormality. 3. Left ventricular ejection fraction 56% 4. Non invasive risk stratification*: Low  Cath 04/23/18  Mid RCA lesion is 95% stenosed.  Ost 1st Mrg to 1st Mrg lesion is 90% stenosed.  Prox LAD lesion is 35% stenosed.  A drug-eluting stent was successfully placed using a STENT RESOLUTE ONYX 3.5X15.  Post intervention, there is a 0% residual stenosis.  A drug-eluting stent was successfully placed using a STENT RESOLUTE ONYX [id number].  Post intervention, there is a 0% residual stenosis.   1.  Severe stenosis of the mid RCA,  treated successfully with PCI using a drug-eluting stent (3.5 x 15 mm resolute Onyx DES) 2.  Severe stenosis of the first OM branch of the circumflex, treated successfully with PCI using a drug-eluting stent (2.75 x 34 mm resolute Onyx DES) 3.  Widely patent left main 4.  Mild nonobstructive disease of the LAD 5.  Normal LV systolic function     EKG:  EKG ordered today and personally reviewed.  The ekg ordered today demonstrates  Sinus rhythm normal Recent Labs: 04/23/2018: Magnesium 1.9; TSH 2.850 05/15/2018: ALT 44; NT-Pro BNP 95 06/23/2018: BUN 16; Creatinine, Ser 1.06; Hemoglobin 15.5; Platelets 252; Potassium 4.2; Sodium 137  Recent Lipid Panel    Component Value Date/Time   CHOL 124 06/12/2018 0835   TRIG 134 06/12/2018 0835   HDL 44 06/12/2018 0835   CHOLHDL 2.8 06/12/2018 0835   CHOLHDL 4.0 04/23/2018 1547   VLDL 35 04/23/2018 1547   LDLCALC 53 06/12/2018 0835    Physical Exam:    VS:  There were no vitals taken for this visit.    Wt Readings from Last 3 Encounters:  06/26/18 (!) 345 lb (156.5 kg)  06/23/18 (!) 348 lb 3.2 oz (157.9 kg)  06/15/18 (!) 350 lb (158.8 kg)     GEN:  Well nourished, well developed in no acute distress HEENT: Normal NECK: No JVD; No carotid bruits LYMPHATICS: No lymphadenopathy CARDIAC: RRR, no murmurs, rubs, gallops RESPIRATORY:  Clear to auscultation without rales, wheezing or rhonchi  ABDOMEN: Soft, non-tender, non-distended MUSCULOSKELETAL:  No edema; No deformity  SKIN: Warm and dry NEUROLOGIC:  Alert and oriented x 3 PSYCHIATRIC:  Normal affect    Signed, Norman HerrlichBrian Audreyanna Butkiewicz, MD  07/27/2018 9:35 AM    [facility]

## 2018-07-27 NOTE — Patient Instructions (Signed)
Medication Instructions:  Your physician recommends that you continue on your current medications as directed. Please refer to the Current Medication list given to you today.  If you need a refill on your cardiac medications before your next appointment, please call your pharmacy.   Lab work: None If you have labs (blood work) drawn today and your tests are completely normal, you will receive your results only by: Marland Kitchen MyChart Message (if you have MyChart) OR . A paper copy in the mail If you have any lab test that is abnormal or we need to change your treatment, we will call you to review the results.  Testing/Procedures: You had an EKG today  Follow-Up: At [facility], you and your health needs are our priority.  As part of our continuing mission to provide you with exceptional heart care, we have created designated Provider Care Teams.  These Care Teams include your primary Cardiologist (physician) and Advanced Practice Providers (APPs -  Physician Assistants and Nurse Practitioners) who all work together to provide you with the care you need, when you need it. You will need a follow up appointment in 3 months.  Any Other Special Instructions Will Be Listed Below (If Applicable).

## 2018-07-30 DIAGNOSIS — I252 Old myocardial infarction: Secondary | ICD-10-CM

## 2018-07-30 HISTORY — DX: Old myocardial infarction: I25.2

## 2018-08-07 ENCOUNTER — Telehealth: Payer: Self-pay | Admitting: Cardiology

## 2018-08-07 NOTE — Telephone Encounter (Signed)
Patient is asking to be able to return to work on August 3. He will need a letter stating he can return to work. Please advise.

## 2018-08-07 NOTE — Telephone Encounter (Signed)
OK but from me or surgeon?

## 2018-08-07 NOTE — Telephone Encounter (Signed)
Please advise. Thanks.  

## 2018-08-08 ENCOUNTER — Encounter: Payer: Self-pay | Admitting: *Deleted

## 2018-08-08 NOTE — Telephone Encounter (Signed)
Letter has been generated and waiting on Dr. Joya Gaskins signature. Patient is able to return to work with no restrictions. Patient will be notify once letter is signed.

## 2018-08-09 NOTE — Telephone Encounter (Signed)
Patient informed that completed letter is ready for pick up in the [place] [address] [place] office. Patient verbalized understanding and will have his son come pick it up since he is out of town. No further questions.

## 2018-08-13 DIAGNOSIS — Z955 Presence of coronary angioplasty implant and graft: Secondary | ICD-10-CM | POA: Diagnosis not present

## 2018-08-13 DIAGNOSIS — I252 Old myocardial infarction: Secondary | ICD-10-CM | POA: Diagnosis not present

## 2018-08-15 DIAGNOSIS — I252 Old myocardial infarction: Secondary | ICD-10-CM | POA: Diagnosis not present

## 2018-08-15 DIAGNOSIS — Z955 Presence of coronary angioplasty implant and graft: Secondary | ICD-10-CM | POA: Diagnosis not present

## 2018-08-17 DIAGNOSIS — Z955 Presence of coronary angioplasty implant and graft: Secondary | ICD-10-CM | POA: Diagnosis not present

## 2018-08-17 DIAGNOSIS — I252 Old myocardial infarction: Secondary | ICD-10-CM | POA: Diagnosis not present

## 2018-08-20 DIAGNOSIS — I252 Old myocardial infarction: Secondary | ICD-10-CM | POA: Diagnosis not present

## 2018-08-20 DIAGNOSIS — Z955 Presence of coronary angioplasty implant and graft: Secondary | ICD-10-CM | POA: Diagnosis not present

## 2018-08-22 DIAGNOSIS — I252 Old myocardial infarction: Secondary | ICD-10-CM | POA: Diagnosis not present

## 2018-08-22 DIAGNOSIS — Z955 Presence of coronary angioplasty implant and graft: Secondary | ICD-10-CM | POA: Diagnosis not present

## 2018-08-24 DIAGNOSIS — Z955 Presence of coronary angioplasty implant and graft: Secondary | ICD-10-CM | POA: Diagnosis not present

## 2018-08-24 DIAGNOSIS — I252 Old myocardial infarction: Secondary | ICD-10-CM | POA: Diagnosis not present

## 2018-08-27 DIAGNOSIS — I252 Old myocardial infarction: Secondary | ICD-10-CM | POA: Diagnosis not present

## 2018-08-27 DIAGNOSIS — Z955 Presence of coronary angioplasty implant and graft: Secondary | ICD-10-CM | POA: Diagnosis not present

## 2018-08-29 DIAGNOSIS — I252 Old myocardial infarction: Secondary | ICD-10-CM | POA: Diagnosis not present

## 2018-08-29 DIAGNOSIS — Z955 Presence of coronary angioplasty implant and graft: Secondary | ICD-10-CM | POA: Diagnosis not present

## 2018-08-31 DIAGNOSIS — I252 Old myocardial infarction: Secondary | ICD-10-CM | POA: Diagnosis not present

## 2018-08-31 DIAGNOSIS — Z955 Presence of coronary angioplasty implant and graft: Secondary | ICD-10-CM | POA: Diagnosis not present

## 2018-09-10 DIAGNOSIS — G514 Facial myokymia: Secondary | ICD-10-CM

## 2018-09-10 HISTORY — DX: Facial myokymia: G51.4

## 2018-10-11 ENCOUNTER — Telehealth: Payer: Self-pay | Admitting: Cardiology

## 2018-10-11 NOTE — Telephone Encounter (Signed)
Had some pains last night that he wants to talk about

## 2018-10-11 NOTE — Telephone Encounter (Signed)
Doubt CAD since pain is worst with movement of the neck  Cont monitoring. Let us know if it gets worst

## 2018-10-11 NOTE — Telephone Encounter (Signed)
Called patient he reports left and right shoulder blade pain that started last night. It isn't as intense today and he has been resting most of the day. He does notice some pain in his neck and has felt maybe one or two instances where he felt like the pain was in his chest however he is unsure if it really was in his chest. His main complaint is the shoulder blade pain and he is concerned. He notices the pain increases with neck rotation. He denies shortness of breath or nausea. Will consult with DOD and get back with patient.

## 2018-10-11 NOTE — Telephone Encounter (Signed)
Patient is wanting to talk to someone regarding the pain he is experiencing in his upper arms, upper back and chest  Please call patient to discuss

## 2018-10-11 NOTE — Telephone Encounter (Signed)
Called patient informed him of Dr. Wendy Poet recommendation, he will continue to monitor and let us know if anything gets worse.

## 2018-10-11 NOTE — Telephone Encounter (Signed)
Attempted to contact patient on home phone with no answer. Left message on patient's cell phone to return call to discuss.

## 2018-10-29 ENCOUNTER — Ambulatory Visit: Payer: Self-pay | Admitting: Cardiology

## 2018-10-29 NOTE — Progress Notes (Signed)
Cardiology Office Note:    Date:  10/30/2018   ID:  Brandon [name], DOB 1965/07/15, MRN [id number]  PCP: [facility] Cardiologist:  Norman HerrlichBrian Munley, MD    Referring MD: Dr. Casper HarrisonStreet   ASSESSMENT:    1. CAD in native artery   2. Mixed hyperlipidemia   3. Essential hypertension    PLAN:    In order of problems listed above:  1. Stable New York Heart Association class I having no angina current medical therapy after PCI and stent.  He will continue dual antiplatelet therapy 12 months uninterrupted.  Strongly encouraged him to follow a good lifestyle and get back to weight loss and exercise. 2. Stable lipids are ideal continue combined high intensity statin Zetia 3. Stable BP at target continue treatment including ACE beta-blocker with normal renal function and potassium   Next appointment: 6 months   Medication Adjustments/Labs and Tests Ordered: Current medicines are reviewed at length with the patient today.  Concerns regarding medicines are outlined above.  No orders of the defined types were placed in this encounter.  No orders of the defined types were placed in this encounter.   Chief Complaint  Patient presents with  . Follow-up  . Coronary Artery Disease    History of Present Illness:    Brandon PanderLyle Horsman is a 53 y.o. male with a hx of  morbid obesity s/p gastric bypass, HTN, HLD, CAD with NSTEMI and PCI stent RCA and marginal branch (04/23/18). he wasseen by me 06/25/18 after visit to the ED 06/23/18 for chest pain with negative troponing, Myoview low risk EF 56%, fixed defect in the apical segment inferolateral wall and no ischemia with negative chest CTA and negative COVID test. His chest pain is on the right side worse with eating. Started on PPI and underwent galbladder ultrasound which showed cholelithiasis. He was referred to Dr. Marvis MoellerJames Morgan and underwent a  cholecytectomy.    US 06/29/2018:IMPRESSION: 1. Multiple gallstones. No gallbladder wall thickening or biliary  distention. 2. Increased hepatic echogenicity consistent fatty infiltration and/or hepatocellular disease. 3.  Incidental note made of right renal cysts, most likely benign last seen 07/27/2018. Compliance with diet, lifestyle and medications: Yes  He has done well since gallbladder surgery has had no angina shortness of breath palpitation or syncope and tolerates his dual antiplatelet therapy.  Will continue uninterrupted for 12 months.  Unfortunately is gained approximately 50 pounds in the last 6 months and is committed to getting back to weight loss. Past Medical History:  Diagnosis Date  . ACS (acute coronary syndrome) (HCC) 04/2018  . Acute coronary syndrome (HCC) 04/23/2018  . Arthritis    both knees  . Arthritis of knee, degenerative 05/14/2015  . CAD in native artery 05/06/2018  . Chronic right shoulder pain 05/28/2015  . Complication of anesthesia    " i HAVE A HARD TIME WAKING UP "  . Coronary artery disease   . Essential hypertension 05/14/2015  . Hypertension   . Mixed hyperlipidemia 05/14/2015  . Non-ST elevation (NSTEMI) myocardial infarction (HCC)   . Pre-diabetes 03/30/2016   2017: x/6.7 2018: 103/6.4  . Right elbow pain 05/28/2015    Past Surgical History:  Procedure Laterality Date  . CORONARY STENT INTERVENTION N/A 04/23/2018   Procedure: CORONARY STENT INTERVENTION;  Surgeon: Tonny Bollmanooper, Michael, MD;  [facility];  Service: Cardiovascular;  Laterality: N/A;  . EXCISION HAGLUND'S DEFORMITY WITH ACHILLES TENDON REPAIR Right 05/22/2014   Procedure: RIGHT ACHILLES DEBRIDEMENT; MWNUUVO'ZHAGLUND'S EXCISION ;  Surgeon: Jonny RuizJohn  Doran Durand, MD;  Location: [place];  Service: Orthopedics;  Laterality: Right;  . GASTROCNEMIUS RECESSION Right 05/22/2014   Procedure: GASTROC RECESSION ;  Surgeon: Wylene Simmer, MD;  Location: [place];  Service: Orthopedics;  Laterality: Right;  . KNEE SURGERY     l/knee  . LAPAROSCOPIC GASTRIC SLEEVE RESECTION    .  LEFT HEART CATH AND CORONARY ANGIOGRAPHY N/A 04/23/2018   Procedure: LEFT HEART CATH AND CORONARY ANGIOGRAPHY;  Surgeon: Sherren Mocha, MD;  Location: [place] CV LAB;  Service: Cardiovascular;  Laterality: N/A;  . MOUTH SURGERY     Root canal    Current Medications: Current Meds  Medication Sig  . aspirin 81 MG chewable tablet Chew 1 tablet (81 mg total) by mouth daily.  Marland Kitchen atorvastatin (LIPITOR) 80 MG tablet Take 1 tablet (80 mg total) by mouth daily at 6 PM.  . enalapril (VASOTEC) 10 MG tablet Take 10 mg by mouth daily.  Marland Kitchen ezetimibe (ZETIA) 10 MG tablet Take 1 tablet (10 mg total) by mouth daily.  . metoprolol tartrate (LOPRESSOR) 25 MG tablet Take 1 tablet (25 mg total) by mouth 2 (two) times daily.  . Multiple Vitamin (MULTIVITAMIN WITH MINERALS) TABS tablet Take 1 tablet by mouth daily.  . nitroGLYCERIN (NITROSTAT) 0.4 MG SL tablet Place 1 tablet (0.4 mg total) under the tongue every 5 (five) minutes as needed for chest pain.  . pantoprazole (PROTONIX) 40 MG tablet Take 1 tablet (40 mg total) by mouth daily.  . prasugrel (EFFIENT) 10 MG TABS tablet Take 10 mg by mouth daily.     Allergies:   Brilinta [ticagrelor]   Social History   Socioeconomic History  . Marital status: Married    Spouse name: Not on file  . Number of children: Not on file  . Years of education: Not on file  . Highest education level: Not on file  Occupational History  . Not on file  Social Needs  . Financial resource strain: Not on file  . Food insecurity    Worry: Not on file    Inability: Not on file  . Transportation needs    Medical: Not on file    Non-medical: Not on file  Tobacco Use  . Smoking status: Former Smoker    Packs/day: 2.00    Years: 10.00    Pack years: 20.00    Types: Cigarettes  . Smokeless tobacco: Never Used  . Tobacco comment: Quti smoking 20 years ago  Substance and Sexual Activity  . Alcohol use: Yes    Comment: occ  . Drug use: No  . Sexual activity: Yes   Lifestyle  . Physical activity    Days per week: Not on file    Minutes per session: Not on file  . Stress: Not on file  Relationships  . Social Herbalist on phone: Not on file    Gets together: Not on file    Attends religious service: Not on file    Active member of club or organization: Not on file    Attends meetings of clubs or organizations: Not on file    Relationship status: Not on file  Other Topics Concern  . Not on file  Social History Narrative  . Not on file     Family History: The patient's family history includes Cancer in his mother; Heart disease (age of onset: 59) in his father; Hypertension in his father. ROS:   Please see the history of  present illness.    All other systems reviewed and are negative.  EKGs/Labs/Other Studies Reviewed:    The following studies were reviewed today:  EKG:  EKG ordered today and personally reviewed.  The ekg ordered today demonstrates sinus rhythm right axis deviation otherwise normal EKG  Recent Labs: 04/23/2018: Magnesium 1.9; TSH 2.850 05/15/2018: ALT 44; NT-Pro BNP 95 06/23/2018: BUN 16; Creatinine, Ser 1.06; Hemoglobin 15.5; Platelets 252; Potassium 4.2; Sodium 137  Recent Lipid Panel    Component Value Date/Time   CHOL 124 06/12/2018 0835   TRIG 134 06/12/2018 0835   HDL 44 06/12/2018 0835   CHOLHDL 2.8 06/12/2018 0835   CHOLHDL 4.0 04/23/2018 1547   VLDL 35 04/23/2018 1547   LDLCALC 53 06/12/2018 0835    Physical Exam:    VS:  BP 134/88 (BP Location: Right Arm, Patient Position: Sitting, Cuff Size: Large)   Pulse (!) 55   Ht 6\' 2"  (1.88 m)   Wt (!) 360 lb 12.8 oz (163.7 kg)   SpO2 97%   BMI 46.32 kg/m     Wt Readings from Last 3 Encounters:  10/30/18 (!) 360 lb 12.8 oz (163.7 kg)  07/27/18 (!) 359 lb 3.2 oz (162.9 kg)  06/26/18 (!) 345 lb (156.5 kg)     GEN: Marked obesity BMI greater than 40 well nourished, well developed in no acute distress HEENT: Normal NECK: No JVD; No carotid bruits  LYMPHATICS: No lymphadenopathy CARDIAC: RRR, no murmurs, rubs, gallops RESPIRATORY:  Clear to auscultation without rales, wheezing or rhonchi  ABDOMEN: Soft, non-tender, non-distended MUSCULOSKELETAL:  No edema; No deformity  SKIN: Warm and dry NEUROLOGIC:  Alert and oriented x 3 PSYCHIATRIC:  Normal affect    Signed, 06/28/18, MD  10/30/2018 8:53 AM    [facility]

## 2018-10-30 ENCOUNTER — Encounter: Payer: Self-pay | Admitting: Cardiology

## 2018-10-30 ENCOUNTER — Other Ambulatory Visit: Payer: Self-pay

## 2018-10-30 ENCOUNTER — Ambulatory Visit (INDEPENDENT_AMBULATORY_CARE_PROVIDER_SITE_OTHER): Payer: BC Managed Care – PPO | Admitting: Cardiology

## 2018-10-30 VITALS — BP 134/88 | HR 55 | Ht 74.0 in | Wt 360.8 lb

## 2018-10-30 DIAGNOSIS — I2510 Atherosclerotic heart disease of native coronary artery without angina pectoris: Secondary | ICD-10-CM | POA: Diagnosis not present

## 2018-10-30 DIAGNOSIS — I10 Essential (primary) hypertension: Secondary | ICD-10-CM | POA: Diagnosis not present

## 2018-10-30 DIAGNOSIS — E782 Mixed hyperlipidemia: Secondary | ICD-10-CM

## 2018-10-30 NOTE — Patient Instructions (Signed)
Medication Instructions:  Your physician recommends that you continue on your current medications as directed. Please refer to the Current Medication list given to you today.  *If you need a refill on your cardiac medications before your next appointment, please call your pharmacy*  Lab Work: None  If you have labs (blood work) drawn today and your tests are completely normal, you will receive your results only by: . MyChart Message (if you have MyChart) OR . A paper copy in the mail If you have any lab test that is abnormal or we need to change your treatment, we will call you to review the results.  Testing/Procedures: You had an EKG today.   Follow-Up: At [facility], you and your health needs are our priority.  As part of our continuing mission to provide you with exceptional heart care, we have created designated Provider Care Teams.  These Care Teams include your primary Cardiologist (physician) and Advanced Practice Providers (APPs -  Physician Assistants and Nurse Practitioners) who all work together to provide you with the care you need, when you need it.  Your next appointment:   6 month(s)  The format for your next appointment:   In Person  Provider:   Brian Munley, MD   

## 2018-11-03 ENCOUNTER — Other Ambulatory Visit: Payer: Self-pay | Admitting: Cardiology

## 2018-11-05 DIAGNOSIS — R05 Cough: Secondary | ICD-10-CM | POA: Diagnosis not present

## 2018-11-05 DIAGNOSIS — Z20828 Contact with and (suspected) exposure to other viral communicable diseases: Secondary | ICD-10-CM | POA: Diagnosis not present

## 2019-01-28 DIAGNOSIS — Z20828 Contact with and (suspected) exposure to other viral communicable diseases: Secondary | ICD-10-CM | POA: Diagnosis not present

## 2019-02-01 ENCOUNTER — Encounter (HOSPITAL_BASED_OUTPATIENT_CLINIC_OR_DEPARTMENT_OTHER): Payer: Self-pay

## 2019-02-01 ENCOUNTER — Emergency Department (HOSPITAL_BASED_OUTPATIENT_CLINIC_OR_DEPARTMENT_OTHER)
Admission: EM | Admit: 2019-02-01 | Discharge: 2019-02-01 | Disposition: A | Discharge status: Home / Self Care | Payer: BC Managed Care – PPO | Attending: Emergency Medicine | Admitting: Emergency Medicine

## 2019-02-01 ENCOUNTER — Other Ambulatory Visit: Payer: Self-pay

## 2019-02-01 ENCOUNTER — Emergency Department (HOSPITAL_BASED_OUTPATIENT_CLINIC_OR_DEPARTMENT_OTHER): Payer: BC Managed Care – PPO

## 2019-02-01 ENCOUNTER — Telehealth: Payer: Self-pay | Admitting: Cardiology

## 2019-02-01 DIAGNOSIS — Z87891 Personal history of nicotine dependence: Secondary | ICD-10-CM | POA: Diagnosis not present

## 2019-02-01 DIAGNOSIS — R0602 Shortness of breath: Secondary | ICD-10-CM | POA: Diagnosis not present

## 2019-02-01 DIAGNOSIS — I10 Essential (primary) hypertension: Secondary | ICD-10-CM | POA: Diagnosis not present

## 2019-02-01 DIAGNOSIS — R079 Chest pain, unspecified: Secondary | ICD-10-CM | POA: Diagnosis not present

## 2019-02-01 DIAGNOSIS — R0789 Other chest pain: Secondary | ICD-10-CM | POA: Diagnosis not present

## 2019-02-01 DIAGNOSIS — Z20822 Contact with and (suspected) exposure to covid-19: Secondary | ICD-10-CM | POA: Insufficient documentation

## 2019-02-01 DIAGNOSIS — Z79899 Other long term (current) drug therapy: Secondary | ICD-10-CM | POA: Diagnosis not present

## 2019-02-01 DIAGNOSIS — I2510 Atherosclerotic heart disease of native coronary artery without angina pectoris: Secondary | ICD-10-CM | POA: Diagnosis not present

## 2019-02-01 DIAGNOSIS — E669 Obesity, unspecified: Secondary | ICD-10-CM | POA: Insufficient documentation

## 2019-02-01 DIAGNOSIS — Z7982 Long term (current) use of aspirin: Secondary | ICD-10-CM | POA: Insufficient documentation

## 2019-02-01 DIAGNOSIS — Z6841 Body Mass Index (BMI) 40.0 and over, adult: Secondary | ICD-10-CM | POA: Insufficient documentation

## 2019-02-01 DIAGNOSIS — I252 Old myocardial infarction: Secondary | ICD-10-CM | POA: Diagnosis not present

## 2019-02-01 LAB — D-DIMER, QUANTITATIVE: D-Dimer, Quant: 0.4 ug/mL-FEU (ref 0.00–0.50)

## 2019-02-01 LAB — CBC WITH DIFFERENTIAL/PLATELET
Abs Immature Granulocytes: 0.04 10*3/uL (ref 0.00–0.07)
Basophils Absolute: 0.1 10*3/uL (ref 0.0–0.1)
Basophils Relative: 1 %
Eosinophils Absolute: 0.2 10*3/uL (ref 0.0–0.5)
Eosinophils Relative: 2 %
HCT: 47.9 % (ref 39.0–52.0)
Hemoglobin: 16.1 g/dL (ref 13.0–17.0)
Immature Granulocytes: 0 %
Lymphocytes Relative: 30 %
Lymphs Abs: 3.2 10*3/uL (ref 0.7–4.0)
MCH: 31.7 pg (ref 26.0–34.0)
MCHC: 33.6 g/dL (ref 30.0–36.0)
MCV: 94.3 fL (ref 80.0–100.0)
Monocytes Absolute: 0.8 10*3/uL (ref 0.1–1.0)
Monocytes Relative: 8 %
Neutro Abs: 6.1 10*3/uL (ref 1.7–7.7)
Neutrophils Relative %: 59 %
Platelets: 296 10*3/uL (ref 150–400)
RBC: 5.08 MIL/uL (ref 4.22–5.81)
RDW: 12.8 % (ref 11.5–15.5)
WBC: 10.5 10*3/uL (ref 4.0–10.5)
nRBC: 0 % (ref 0.0–0.2)

## 2019-02-01 LAB — BASIC METABOLIC PANEL
Anion gap: 10 (ref 5–15)
BUN: 17 mg/dL (ref 6–20)
CO2: 26 mmol/L (ref 22–32)
Calcium: 9.7 mg/dL (ref 8.9–10.3)
Chloride: 101 mmol/L (ref 98–111)
Creatinine, Ser: 0.87 mg/dL (ref 0.61–1.24)
GFR calc Af Amer: >60 mL/min (ref >60)
GFR calc non Af Amer: >60 mL/min (ref >60)
Glucose, Bld: 112 mg/dL — ABNORMAL HIGH (ref 70–99)
Potassium: 4.5 mmol/L (ref 3.5–5.1)
Sodium: 137 mmol/L (ref 135–145)

## 2019-02-01 LAB — BRAIN NATRIURETIC PEPTIDE: B Natriuretic Peptide: 20.5 pg/mL (ref 0.0–100.0)

## 2019-02-01 LAB — TROPONIN I (HIGH SENSITIVITY): Troponin I (High Sensitivity): 6 ng/L (ref <18)

## 2019-02-01 LAB — SARS CORONAVIRUS 2 AG (30 MIN TAT): SARS Coronavirus 2 Ag: NEGATIVE

## 2019-02-01 NOTE — Telephone Encounter (Signed)
Patient thought he had COVID. He got tested and it was negative. He has coughing, body aches in his neck and shoulders.When he lays down at night to go to sleep he has a hard time breathing. He has a heaviness in his chest.  His wife brought to his attention that these are a lot of the same symptoms that he had before he had his last heart attack.   He just wanted to know what he should do next, whether it is make an appt with Dr. Dulce Sellar or his PCP. He would like a nurse to call him sometime soon today

## 2019-02-01 NOTE — ED Triage Notes (Signed)
Pt c/o sore throat, body aches, HA, ShOB. Pt had a negative COVID test on 1/18

## 2019-02-01 NOTE — Discharge Instructions (Signed)
Your workup did not show signs of a heart attack or blood clot or infection in your lungs.  However, you should still call your cardiologist office tomorrow to follow up on this visit.  Because of your age and risk factors, your heart doctor may wish to run additional tests.

## 2019-02-01 NOTE — Telephone Encounter (Signed)
I think the best thing to do is have him go to the ED [facility] and have a chest pain evaluation including high-sensitivity troponin.

## 2019-02-01 NOTE — ED Provider Notes (Signed)
[facility] EMERGENCY DEPARTMENT Provider Note   CSN: [id number] Arrival date & time: 02/01/19  1656     History Shortness of breath  Brandon [name] is a 54 y.o. male with a history of MI 1 year ago status post 2 stents, on aspirin and effient, history of high cholesterol, hypertension, obesity, presenting to the emergency department with shortness of breath.  Patient reports he developed symptoms of cough and congestion approximately 4 days ago.  He was sore throat at that time as well as body aches and a headache.  He had a Covid test done as an outpatient resulted negative yesterday.  He reports he has had persistent shortness of breath with lying flat on his back since then.  He normally does not have orthopnea.  He denies that he is having dyspnea on exertion.  He reports some mild bilateral chest wall pressure sensation.  He says it does not feel like his heart attack last year.  However his wife told him that many of his symptoms are similar.  He called his cardiologist who recommended that he come to the emergency department for cardiac evaluation.  Denies hx of DVT or PE.  No leg swelling.  Denies known hx of CHF, no visible echo per our records or care everywhere  Quit smoking 25 years ago  HPI     Past Medical History:  Diagnosis Date  . ACS (acute coronary syndrome) (HCC) 04/2018  . Acute coronary syndrome (HCC) 04/23/2018  . Arthritis    both knees  . Arthritis of knee, degenerative 05/14/2015  . CAD in native artery 05/06/2018  . Chronic right shoulder pain 05/28/2015  . Complication of anesthesia    " i HAVE A HARD TIME WAKING UP "  . Coronary artery disease   . Essential hypertension 05/14/2015  . Hypertension   . Mixed hyperlipidemia 05/14/2015  . Non-ST elevation (NSTEMI) myocardial infarction (HCC)   . Pre-diabetes 03/30/2016   2017: x/6.7 2018: 103/6.4  . Right elbow pain 05/28/2015    Patient Active Problem List   Diagnosis Date Noted  . Chest pain  06/23/2018  . SOB (shortness of breath) on exertion 05/16/2018  . CAD in native artery 05/06/2018  . Acute coronary syndrome (HCC) 04/23/2018  . Non-ST elevation (NSTEMI) myocardial infarction (HCC)   . Pre-diabetes 03/30/2016  . Chronic right shoulder pain 05/28/2015  . Right elbow pain 05/28/2015  . Essential hypertension 05/14/2015  . Mixed hyperlipidemia 05/14/2015  . Arthritis of knee, degenerative 05/14/2015    Past Surgical History:  Procedure Laterality Date  . CORONARY STENT INTERVENTION N/A 04/23/2018   Procedure: CORONARY STENT INTERVENTION;  Surgeon: Tonny Bollman, MD;  Location: [facility] INVASIVE CV LAB;  Service: Cardiovascular;  Laterality: N/A;  . EXCISION HAGLUND'S DEFORMITY WITH ACHILLES TENDON REPAIR Right 05/22/2014   Procedure: RIGHT ACHILLES DEBRIDEMENT; HAGLUND'S EXCISION ;  Surgeon: Toni Arthurs, MD;  Location: [facility];  Service: Orthopedics;  Laterality: Right;  . GASTROCNEMIUS RECESSION Right 05/22/2014   Procedure: GASTROC RECESSION ;  Surgeon: Toni Arthurs, MD;  Location: [facility];  Service: Orthopedics;  Laterality: Right;  . KNEE SURGERY     l/knee  . LAPAROSCOPIC GASTRIC SLEEVE RESECTION    . LEFT HEART CATH AND CORONARY ANGIOGRAPHY N/A 04/23/2018   Procedure: LEFT HEART CATH AND CORONARY ANGIOGRAPHY;  Surgeon: Tonny Bollman, MD;  Location: [facility] INVASIVE CV LAB;  Service: Cardiovascular;  Laterality: N/A;  . MOUTH SURGERY     Root canal  Family History  Problem Relation Age of Onset  . Cancer Mother   . Heart disease Father 22  . Hypertension Father     Social History   Tobacco Use  . Smoking status: Former Smoker    Packs/day: 2.00    Years: 10.00    Pack years: 20.00    Types: Cigarettes  . Smokeless tobacco: Never Used  . Tobacco comment: Quti smoking 20 years ago  Substance Use Topics  . Alcohol use: Yes    Comment: occ  . Drug use: No    Home Medications Prior to Admission medications    Medication Sig Start Date End Date Taking? Authorizing Provider  aspirin 81 MG chewable tablet Chew 1 tablet (81 mg total) by mouth daily. 04/24/18   Barrett, Joline Salt, PA-C  atorvastatin (LIPITOR) 80 MG tablet Take 1 tablet (80 mg total) by mouth daily at 6 PM. 05/30/18   Dulce Sellar, Iline Oven, MD  enalapril (VASOTEC) 10 MG tablet Take 10 mg by mouth daily.    [provider]  ezetimibe (ZETIA) 10 MG tablet Take 1 tablet (10 mg total) by mouth daily. 05/16/18 10/30/23  Baldo Daub, MD  metoprolol tartrate (LOPRESSOR) 25 MG tablet Take 1 tablet (25 mg total) by mouth 2 (two) times daily. 05/30/18   Baldo Daub, MD  Multiple Vitamin (MULTIVITAMIN WITH MINERALS) TABS tablet Take 1 tablet by mouth daily.    [provider]  nitroGLYCERIN (NITROSTAT) 0.4 MG SL tablet Place 1 tablet (0.4 mg total) under the tongue every 5 (five) minutes as needed for chest pain. 04/24/18   Barrett, Joline Salt, PA-C  pantoprazole (PROTONIX) 40 MG tablet Take 1 tablet (40 mg total) by mouth daily. 06/26/18   Baldo Daub, MD  prasugrel (EFFIENT) 10 MG TABS tablet TAKE 60 MG (6 TABS) FOR 2 DAYS THEN START 10 MG DAILY 11/05/18   Dulce Sellar Iline Oven, MD    Allergies    Brilinta [ticagrelor]  Review of Systems   Review of Systems  Constitutional: Positive for chills and fatigue. Negative for fever.  Eyes: Negative for photophobia and visual disturbance.  Respiratory: Positive for cough and shortness of breath.   Cardiovascular: Positive for chest pain. Negative for palpitations and leg swelling.  Gastrointestinal: Positive for nausea. Negative for abdominal pain and vomiting.  Musculoskeletal: Positive for arthralgias and myalgias.  Skin: Negative for pallor and rash.  Neurological: Positive for light-headedness and headaches. Negative for syncope.  Psychiatric/Behavioral: Negative for agitation and confusion.  All other systems reviewed and are negative.   Physical Exam Updated Vital Signs BP (!)  137/102   Pulse 80   Temp 98.4 F (36.9 C) (Oral)   Resp 20   Ht 6\' 2"  (1.88 m)   Wt (!) 167.8 kg   SpO2 97%   BMI 47.51 kg/m   Physical Exam Vitals and nursing note reviewed.  Constitutional:      Appearance: He is well-developed. He is obese.  HENT:     Head: Normocephalic and atraumatic.  Eyes:     Conjunctiva/sclera: Conjunctivae normal.  Cardiovascular:     Rate and Rhythm: Normal rate and regular rhythm.     Pulses: Normal pulses.     Heart sounds: No murmur.  Pulmonary:     Effort: Pulmonary effort is normal. No respiratory distress.     Breath sounds: Normal breath sounds.  Musculoskeletal:        General: No swelling or deformity.     Cervical back:  Neck supple.  Skin:    General: Skin is warm and dry.  Neurological:     Mental Status: He is alert.  Psychiatric:        Mood and Affect: Mood normal.        Behavior: Behavior normal.     ED Results / Procedures / Treatments   Labs (all labs ordered are listed, but only abnormal results are displayed) Labs Reviewed  BASIC METABOLIC PANEL - Abnormal; Notable for the following components:      Result Value   Glucose, Bld 112 (*)    All other components within normal limits  SARS CORONAVIRUS 2 AG (30 MIN TAT)  CBC WITH DIFFERENTIAL/PLATELET  BRAIN NATRIURETIC PEPTIDE  D-DIMER, QUANTITATIVE (NOT AT [facility])  TROPONIN I (HIGH SENSITIVITY)    EKG EKG Interpretation  Date/Time:  Friday February 01 2019 17:03:53 EST Ventricular Rate:  75 PR Interval:  150 QRS Duration: 94 QT Interval:  366 QTC Calculation: 408 R Axis:   152 Text Interpretation: Normal sinus rhythm Incomplete right bundle branch block Left posterior fascicular block Abnormal ECG No STEMI Confirmed by Octaviano Glow [phone]) on 02/01/2019 5:54:01 PM   Radiology DG Chest Portable 1 View  Result Date: 02/01/2019 CLINICAL DATA:  Shortness of breath. EXAM: PORTABLE CHEST 1 VIEW COMPARISON:  June 23, 2018 FINDINGS: A trace amount of linear  atelectasis is seen within the lateral aspect of the left lung base. There is no evidence of a pleural effusion or pneumothorax. The heart size and mediastinal contours are within normal limits. The visualized skeletal structures are unremarkable. IMPRESSION: 1. Trace amount of left basilar linear atelectasis. Electronically Signed   By: Virgina Norfolk M.D.   On: 02/01/2019 17:53    Procedures Procedures (including critical care time)  Medications Ordered in ED Medications - No data to display  ED Course  I have reviewed the triage vital signs and the nursing notes.  Pertinent labs & imaging results that were available during my care of the patient were reviewed by me and considered in my medical decision making (see chart for details).  54 yo male presenting to ED with viral-type syndrome for several days, beginning with sore throat and body aches, and subsequently developing SOB.  He is afebrile and well appearing on exam, with no evidence of acute respiratory distress.  We'll check a COVID antigen test here and get a xray.   Given his significant cardiac history and risk factors, we'll obtain a cardiac evaluation including ecg, BNP, troponin, as well as a DDimer to evaluate for possible clots.  He is not hypoxic or tachycardic; if his dimer is negative, I do not believe he would need further PE workup in the emergency department.  Update Xray demonstrated mild left sided basilar atalectasis. I spoke to the patient about this, explaining that this can be a normal physiological finding.  In the absence of fever, leukocytosis, or hypoxia, I have a lower concern for bacterial PNA, and am not inclined to treat this finding at this time.  I also discussed the ECG machine report of RBBB (which may be an over-read, I suspect, from the pattern visualized in lead III), and what a conduction block means.  I advised that he follow up with cardiologist for his visit today.  With his negative cardiac  workup, HEART score of 3, I believe it is reasonable to discharge the patient home at this time.   Clinical Course as of Feb 02 1131  Fri Feb 01, 2019  2114 Will discharge, f/u with cardiologist by phone tomorrow   [MT]    Clinical Course User Index [MT] Taiwana Willison, Kermit Balo, MD    Final Clinical Impression(s) / ED Diagnoses Final diagnoses:  Shortness of breath  Chest pain, unspecified type    Rx / DC Orders ED Discharge Orders    None       Terald Sleeper, MD 02/02/19 1134

## 2019-02-01 NOTE — Telephone Encounter (Signed)
Advised the patient of Dr. Thersa Salt recommendation to be evaluated by [facility] d/t his chest pain. Pt verbalized understanding.

## 2019-02-01 NOTE — Telephone Encounter (Signed)
Pt assumed he had COVID d/t symptoms and got tested on Monday and the test can back negative.   Pt states he has been coughing & has body aches. Symptoms worsen at night when he lays down he has difficulty breathing d/t a heaviness in his chest.  Denies fever, N/V or CP at this time.   Pt states he felt similarly before his last heart attack and is concerned. He would like Dr. Dulce Sellar to advise.

## 2019-02-08 ENCOUNTER — Other Ambulatory Visit: Payer: Self-pay | Admitting: Cardiology

## 2019-02-24 ENCOUNTER — Other Ambulatory Visit: Payer: Self-pay | Admitting: Cardiology

## 2019-03-04 NOTE — Progress Notes (Signed)
Cardiology Office Note:    Date:  03/05/2019   ID:  [name], DOB Aug 30, 1965, MRN [id number]  PCP:  Algis Greenhouse, MD  Cardiologist:  Shirlee More, MD    Referring MD: Algis Greenhouse, MD    ASSESSMENT:    1. Essential hypertension   2. CAD in native artery   3. Mixed hyperlipidemia    PLAN:    In order of problems listed above:  1. Poorly controlled multifactorial including sedentary lifestyle weight gain will transition from ACE inhibitor to ARB thiazide diuretic check renal function in 2 weeks office follow-up 4 weeks and asked him to check his home blood pressure several times a week and record 2. Stable CAD continue treatment with aspirin beta-blocker high intensity statin.  I do not think he requires another ischemia evaluation 3. Stable hyperlipidemia continue statin   Next appointment: 4 weeks   Medication Adjustments/Labs and Tests Ordered: Current medicines are reviewed at length with the patient today.  Concerns regarding medicines are outlined above.  No orders of the defined types were placed in this encounter.  Meds ordered this encounter  Medications  . telmisartan-hydrochlorothiazide (MICARDIS HCT) 40-12.5 MG tablet    Sig: Take 1 tablet by mouth daily.    Dispense:  90 tablet    Refill:  1    Chief Complaint  Patient presents with  . Medication Refill    effient    History of Present Illness:    Brandon [name] is a 54 y.o. male with a hx of morbid obesity s/p gastric bypass, HTN, HLD, CAD with NSTEMI and PCI stent RCA and marginal branch (04/23/18). he wasseen by me 06/25/18 after visit to the ED 06/23/18 for chest pain with negative troponing, Myoview low risk EF 56%, fixed defect in the apical segment inferolateral wall and no ischemia with negative chest CTA and negative COVID test. His chest pain is on the right side worse with eating. Started on PPI and underwent galbladder ultrasound which showed cholelithiasis. He was referred to Dr. Orrin Brigham and underwent a  cholecytectomy.  He was seen [facility] ED 02/01/2019 with shortness of breath.  EKG showed incomplete right bundle branch block no ischemic changes laboratory test showed a normal D-dimer testing for COVID-19 was normal BMP CBC were normal as was troponin and proBNP. He was last seen 10/30/2018.  I reviewed his EKG independently and he had mild atelectasis otherwise normal.  Unfortunately he has gained weight he is sedentary and he finds himself breathless when he does activities and under stress.  No edema orthopnea no chest pain palpitation or syncope.  Blood pressure today large cuff repeated by me is 150/90.  I think his problem is poorly controlled hypertension and will switch to ACE inhibitor to ARB diuretic combination with a follow-up BMP in 2 weeks and see me in the office in about 1 month.  At this time I do not think he requires another ischemic evaluation. Compliance with diet, lifestyle and medications: No he no longer exercises after cardiac rehabilitation Past Medical History:  Diagnosis Date  . ACS (acute coronary syndrome) ([place]) 04/2018  . Acute coronary syndrome ([place]) 04/23/2018  . Arthritis    both knees  . Arthritis of knee, degenerative 05/14/2015  . CAD in native artery 05/06/2018  . Chronic right shoulder pain 05/28/2015  . Complication of anesthesia    " i HAVE A HARD TIME WAKING UP "  . Coronary artery disease   .  Essential hypertension 05/14/2015  . Hypertension   . Mixed hyperlipidemia 05/14/2015  . Non-ST elevation (NSTEMI) myocardial infarction (HCC)   . Pre-diabetes 03/30/2016   2017: x/6.7 2018: 103/6.4  . Right elbow pain 05/28/2015    Past Surgical History:  Procedure Laterality Date  . CORONARY STENT INTERVENTION N/A 04/23/2018   Procedure: CORONARY STENT INTERVENTION;  Surgeon: Tonny Bollman, MD;  Location: [facility] INVASIVE CV LAB;  Service: Cardiovascular;  Laterality: N/A;  . EXCISION HAGLUND'S DEFORMITY WITH ACHILLES TENDON  REPAIR Right 05/22/2014   Procedure: RIGHT ACHILLES DEBRIDEMENT; HAGLUND'S EXCISION ;  Surgeon: Toni Arthurs, MD;  Location: [facility];  Service: Orthopedics;  Laterality: Right;  . GASTROCNEMIUS RECESSION Right 05/22/2014   Procedure: GASTROC RECESSION ;  Surgeon: Toni Arthurs, MD;  Location: [facility];  Service: Orthopedics;  Laterality: Right;  . KNEE SURGERY     l/knee  . LAPAROSCOPIC GASTRIC SLEEVE RESECTION    . LEFT HEART CATH AND CORONARY ANGIOGRAPHY N/A 04/23/2018   Procedure: LEFT HEART CATH AND CORONARY ANGIOGRAPHY;  Surgeon: Tonny Bollman, MD;  Location: [facility] INVASIVE CV LAB;  Service: Cardiovascular;  Laterality: N/A;  . MOUTH SURGERY     Root canal    Current Medications: Current Meds  Medication Sig  . aspirin 81 MG chewable tablet Chew 1 tablet (81 mg total) by mouth daily.  Marland Kitchen atorvastatin (LIPITOR) 80 MG tablet Take 1 tablet (80 mg total) by mouth daily at 6 PM.  . ezetimibe (ZETIA) 10 MG tablet TAKE 1 TABLET BY MOUTH EVERY DAY  . metoprolol tartrate (LOPRESSOR) 25 MG tablet TAKE 1 TABLET BY MOUTH TWICE A DAY  . Multiple Vitamin (MULTIVITAMIN WITH MINERALS) TABS tablet Take 1 tablet by mouth daily.  . nitroGLYCERIN (NITROSTAT) 0.4 MG SL tablet Place 1 tablet (0.4 mg total) under the tongue every 5 (five) minutes as needed for chest pain.  . prasugrel (EFFIENT) 10 MG TABS tablet TAKE 60 MG (6 TABS) FOR 2 DAYS THEN START 10 MG DAILY  . [DISCONTINUED] enalapril (VASOTEC) 10 MG tablet Take 10 mg by mouth daily.  . [DISCONTINUED] pantoprazole (PROTONIX) 40 MG tablet Take 1 tablet (40 mg total) by mouth daily.     Allergies:   Brilinta [ticagrelor]   Social History   Socioeconomic History  . Marital status: Married    Spouse name: Not on file  . Number of children: Not on file  . Years of education: Not on file  . Highest education level: Not on file  Occupational History  . Not on file  Tobacco Use  . Smoking status: Former Smoker     Packs/day: 2.00    Years: 10.00    Pack years: 20.00    Types: Cigarettes  . Smokeless tobacco: Never Used  . Tobacco comment: Quti smoking 20 years ago  Substance and Sexual Activity  . Alcohol use: Yes    Comment: occ  . Drug use: No  . Sexual activity: Yes  Other Topics Concern  . Not on file  Social History Narrative  . Not on file   Social Determinants of Health   Financial Resource Strain:   . Difficulty of Paying Living Expenses: Not on file  Food Insecurity:   . Worried About Programme researcher, broadcasting/film/video in the Last Year: Not on file  . Ran Out of Food in the Last Year: Not on file  Transportation Needs:   . Lack of Transportation (Medical): Not on file  . Lack of Transportation (Non-Medical): Not  on file  Physical Activity:   . Days of Exercise per Week: Not on file  . Minutes of Exercise per Session: Not on file  Stress:   . Feeling of Stress : Not on file  Social Connections:   . Frequency of Communication with Friends and Family: Not on file  . Frequency of Social Gatherings with Friends and Family: Not on file  . Attends Religious Services: Not on file  . Active Member of Clubs or Organizations: Not on file  . Attends Banker Meetings: Not on file  . Marital Status: Not on file     Family History: The patient's family history includes Cancer in his mother; Heart disease (age of onset: 87) in his father; Hypertension in his father. ROS:   Please see the history of present illness.    All other systems reviewed and are negative.  EKGs/Labs/Other Studies Reviewed:    The following studies were reviewed today:   Recent Labs: 04/23/2018: Magnesium 1.9; TSH 2.850 05/15/2018: ALT 44; NT-Pro BNP 95 02/01/2019: B Natriuretic Peptide 20.5; BUN 17; Creatinine, Ser 0.87; Hemoglobin 16.1; Platelets 296; Potassium 4.5; Sodium 137  Recent Lipid Panel    Component Value Date/Time   CHOL 124 06/12/2018 0835   TRIG 134 06/12/2018 0835   HDL 44 06/12/2018 0835    CHOLHDL 2.8 06/12/2018 0835   CHOLHDL 4.0 04/23/2018 1547   VLDL 35 04/23/2018 1547   LDLCALC 53 06/12/2018 0835    Physical Exam:    VS:  BP (!) 140/98 (BP Location: Left Arm, Patient Position: Sitting, Cuff Size: Large)   Pulse 68   Ht 6\' 2"  (1.88 m)   Wt (!) 374 lb (169.6 kg)   SpO2 97%   BMI 48.02 kg/m     Wt Readings from Last 3 Encounters:  03/05/19 (!) 374 lb (169.6 kg)  02/01/19 (!) 370 lb (167.8 kg)  10/30/18 (!) 360 lb 12.8 oz (163.7 kg)     GEN:  Well nourished, well developed in no acute distress HEENT: Normal NECK: No JVD; No carotid bruits LYMPHATICS: No lymphadenopathy CARDIAC: RRR, no murmurs, rubs, gallops RESPIRATORY:  Clear to auscultation without rales, wheezing or rhonchi  ABDOMEN: Soft, non-tender, non-distended MUSCULOSKELETAL:  No edema; No deformity  SKIN: Warm and dry NEUROLOGIC:  Alert and oriented x 3 PSYCHIATRIC:  Normal affect    Signed, 11/01/18, MD  03/05/2019 10:44 AM    [facility]

## 2019-03-05 ENCOUNTER — Other Ambulatory Visit: Payer: Self-pay

## 2019-03-05 ENCOUNTER — Ambulatory Visit (INDEPENDENT_AMBULATORY_CARE_PROVIDER_SITE_OTHER): Payer: BC Managed Care – PPO | Admitting: Cardiology

## 2019-03-05 ENCOUNTER — Encounter: Payer: Self-pay | Admitting: Cardiology

## 2019-03-05 VITALS — BP 140/98 | HR 68 | Ht 74.0 in | Wt 374.0 lb

## 2019-03-05 DIAGNOSIS — E782 Mixed hyperlipidemia: Secondary | ICD-10-CM | POA: Diagnosis not present

## 2019-03-05 DIAGNOSIS — I10 Essential (primary) hypertension: Secondary | ICD-10-CM | POA: Diagnosis not present

## 2019-03-05 DIAGNOSIS — I2510 Atherosclerotic heart disease of native coronary artery without angina pectoris: Secondary | ICD-10-CM

## 2019-03-05 MED ORDER — EZETIMIBE 10 MG PO TABS: 10.0000 mg | ORAL_TABLET | Freq: Every day | ORAL | 2 refills | Status: DC

## 2019-03-05 MED ORDER — TELMISARTAN-HCTZ 40-12.5 MG PO TABS: 1.0000 | ORAL_TABLET | Freq: Every day | ORAL | 1 refills | Status: DC

## 2019-03-05 NOTE — Patient Instructions (Signed)
Medication Instructions:  Your physician has recommended you make the following change in your medication:   STOP: Enalapril   START: Telmisartan/HCTZ 40/12.5 mg Take 1 tab daily for blood pressure   *If you need a refill on your cardiac medications before your next appointment, please call your pharmacy*  Lab Work: Your physician recommends that you return for lab work in: 2 week Pro BNP  If you have labs (blood work) drawn today and your tests are completely normal, you will receive your results only by: Marland Kitchen MyChart Message (if you have MyChart) OR . A paper copy in the mail If you have any lab test that is abnormal or we need to change your treatment, we will call you to review the results.  Testing/Procedures: None  Follow-Up: At [facility], you and your health needs are our priority.  As part of our continuing mission to provide you with exceptional heart care, we have created designated Provider Care Teams.  These Care Teams include your primary Cardiologist (physician) and Advanced Practice Providers (APPs -  Physician Assistants and Nurse Practitioners) who all work together to provide you with the care you need, when you need it.  Your next appointment:   1 month(s)  The format for your next appointment:   In Person  Provider:   Norman Herrlich, MD  Other Instructions CHECK BLOOD PRESSURE SEVERAL TIMES A WEEK   Hydrochlorothiazide, HCTZ; Telmisartan Tablets What is this medicine? TELMISARTAN; HYDROCHLOROTHIAZIDE (tel mi SAR tan; hye droe klor oh THYE a zide) is a combination of is an angiotensin II receptor blocker and a diuretic. It treats high blood pressure. This medicine may be used for other purposes; ask your health care provider or pharmacist if you have questions. COMMON BRAND NAME(S): Micardis HCT What should I tell my health care provider before I take this medicine? They need to know if you have any of these conditions:  decreased  urine  diabetes  if you are on a special diet, like a low-salt diet  immune system problems, like lupus  kidney disease  liver disease  an unusual or allergic reaction to telmisartan, hydrochlorothiazide, sulfa drugs, other medicines, foods, dyes, or preservatives  pregnant or trying to get pregnant  breast-feeding How should I use this medicine? Take this drug by mouth. Take it as directed on the prescription label at the same time every day. You can take it with or without food. If it upsets your stomach, take it with food. Keep taking it unless your health care provider tells you to stop. Talk to your health care provider about the use of this drug in children. Special care may be needed. Overdosage: If you think you have taken too much of this medicine contact a poison control center or emergency room at once. NOTE: This medicine is only for you. Do not share this medicine with others. What if I miss a dose? If you miss a dose, take it as soon as you can. If it is almost time for your next dose, take only that dose. Do not take double or extra doses. What may interact with this medicine?  barbiturates, like phenobarbital  blood pressure medicines  corticosteroids  diabetic medicines  digoxin  diuretics, especially triamterene, spironolactone or amiloride  lithium  NSAIDs, medicines for pain and inflammation, like ibuprofen or naproxen  potassium salts or potassium supplements  prescription pain medicines  skeletal muscle relaxants like tubocurarine  some cholesterol lowering medicines like cholestyramine or colestipol  warfarin This  list may not describe all possible interactions. Give your health care provider a list of all the medicines, herbs, non-prescription drugs, or dietary supplements you use. Also tell them if you smoke, drink alcohol, or use illegal drugs. Some items may interact with your medicine. What should I watch for while using this  medicine? Check your blood pressure regularly while you are taking this medicine. Ask your doctor or health care professional what your blood pressure should be and when you should contact him or her. When you check your blood pressure, write down the measurements to show your doctor or health care professional. If you are taking this medicine for a long time, you must visit your health care professional for regular checks on your progress. Make sure you schedule appointments on a regular basis. You must not get dehydrated. Ask your doctor or health care professional how much fluid you need to drink a day. Check with him or her if you get an attack of severe diarrhea, nausea and vomiting, or if you sweat a lot. The loss of too much body fluid can make it dangerous for you to take this medicine. Women should inform their doctor if they wish to become pregnant or think they might be pregnant. There is a potential for serious side effects to an unborn child, particularly in the second or third trimester. Talk to your health care professional or pharmacist for more information. You may get drowsy or dizzy. Do not drive, use machinery, or do anything that needs mental alertness until you know how this drug affects you. Do not stand or sit up quickly, especially if you are an older patient. This reduces the risk of dizzy or fainting spells. Alcohol can make you more drowsy and dizzy. Avoid alcoholic drinks. This medicine may increase blood sugar. Ask your healthcare provider if changes in diet or medicines are needed if you have diabetes. Avoid salt substitutes unless you are told otherwise by your doctor or health care professional. Do not treat yourself for coughs, colds, or pain while you are taking this medicine without asking your doctor or health care professional for advice. Some ingredients may increase your blood pressure. Talk to your health care professional about your risk of skin cancer. You may be  more at risk for skin cancer if you take this medicine. This medicine can make you more sensitive to the sun. Keep out of the sun. If you cannot avoid being in the sun, wear protective clothing and use sunscreen. Do not use sun lamps or tanning beds/booths. What side effects may I notice from receiving this medicine? Side effects that you should report to your doctor or health care professional as soon as possible:  allergic reactions like skin rash, itching or hives, swelling of the face, lips, or tongue  breathing problems  changes in vision  dark urine  eye pain  fast or irregular heart beat, palpitations, or chest pain  feeling faint or lightheaded  muscle cramps  persistent dry cough  redness, blistering, peeling or loosening of the skin, including inside the mouth   signs and symptoms of high blood sugar such as being more thirsty or hungry or having to urinate more than normal. You may also feel very tired or have blurry vision.  stomach pain  trouble passing urine  unusual bleeding or bruising  worsened gout pain  yellowing of the eyes or skin Side effects that usually do not require medical attention (report to your doctor or health care  professional if they continue or are bothersome):  change in sex drive or performance  headache This list may not describe all possible side effects. Call your doctor for medical advice about side effects. You may report side effects to FDA at 1-800-FDA-1088. Where should I keep my medicine? Keep out of the reach of children and pets. Store at room temperature between 20 and 25 degrees C (68 and 77 degrees F). Keep this drug in the original packaging until you are ready to take it. Throw away any unused drug after the expiration date. NOTE: This sheet is a summary. It may not cover all possible information. If you have questions about this medicine, talk to your doctor, pharmacist, or health care provider.  2020 Elsevier/Gold  Standard (2018-09-03 12:59:37)

## 2019-03-05 NOTE — Telephone Encounter (Signed)
Pt called in requesting a refill on this medication.

## 2019-03-12 ENCOUNTER — Other Ambulatory Visit: Payer: Self-pay | Admitting: Cardiology

## 2019-04-05 ENCOUNTER — Ambulatory Visit: Payer: BC Managed Care – PPO | Admitting: Cardiology

## 2019-04-11 NOTE — Progress Notes (Deleted)
Cardiology Office Note:    Date:  04/11/2019   ID:  Brandon [name], DOB 1965-09-08, MRN [id number]  PCP:  Algis Greenhouse, MD  Cardiologist:  Shirlee More, MD    Referring MD: Algis Greenhouse, MD    ASSESSMENT:    No diagnosis found. PLAN:    In order of problems listed above:  1. ***   Next appointment: ***   Medication Adjustments/Labs and Tests Ordered: Current medicines are reviewed at length with the patient today.  Concerns regarding medicines are outlined above.  No orders of the defined types were placed in this encounter.  No orders of the defined types were placed in this encounter.   No chief complaint on file.   History of Present Illness:    Brandon [name] is a 53 y.o. male with a hx of morbid obesity s/p gastric bypass, HTN, HLD, CAD with NSTEMI and PCI stent RCA and marginal branch 04/23/18  last seen 03/05/2019 Compliance with diet, lifestyle and medications: *** Past Medical History:  Diagnosis Date  . ACS (acute coronary syndrome) ([place]) 04/2018  . Acute coronary syndrome ([place]) 04/23/2018  . Arthritis    both knees  . Arthritis of knee, degenerative 05/14/2015  . CAD in native artery 05/06/2018  . Chest pain 06/23/2018  . Chronic right shoulder pain 05/28/2015  . Complication of anesthesia    " i HAVE A HARD TIME WAKING UP "  . Coronary artery disease   . Essential hypertension 05/14/2015  . Facial twitching 09/10/2018  . Hypertension   . Mixed hyperlipidemia 05/14/2015  . Non-ST elevation (NSTEMI) myocardial infarction ([place])   . Pre-diabetes 03/30/2016   2017: x/6.7 2018: 103/6.4  . Right elbow pain 05/28/2015  . SOB (shortness of breath) on exertion 05/16/2018    Past Surgical History:  Procedure Laterality Date  . CORONARY STENT INTERVENTION N/A 04/23/2018   Procedure: CORONARY STENT INTERVENTION;  Surgeon: Sherren Mocha, MD;  Location: [place] CV [facility];  Service: Cardiovascular;  Laterality: N/A;  . EXCISION HAGLUND'S DEFORMITY WITH ACHILLES  TENDON REPAIR Right 05/22/2014   Procedure: RIGHT ACHILLES DEBRIDEMENT; HAGLUND'S EXCISION ;  Surgeon: Wylene Simmer, MD;  Location: [place];  Service: Orthopedics;  Laterality: Right;  . GASTROCNEMIUS RECESSION Right 05/22/2014   Procedure: GASTROC RECESSION ;  Surgeon: Wylene Simmer, MD;  Location: [place];  Service: Orthopedics;  Laterality: Right;  . KNEE SURGERY     l/knee  . LAPAROSCOPIC GASTRIC SLEEVE RESECTION    . LEFT HEART CATH AND CORONARY ANGIOGRAPHY N/A 04/23/2018   Procedure: LEFT HEART CATH AND CORONARY ANGIOGRAPHY;  Surgeon: Sherren Mocha, MD;  Location: [place] [facility];  Service: Cardiovascular;  Laterality: N/A;  . MOUTH SURGERY     Root canal    Current Medications: No outpatient medications have been marked as taking for the 04/12/19 encounter (Appointment) with Richardo Priest, MD.     Allergies:   Brilinta [ticagrelor]   Social History   Socioeconomic History  . Marital status: Married    Spouse name: Not on file  . Number of children: Not on file  . Years of education: Not on file  . Highest education level: Not on file  Occupational History  . Not on file  Tobacco Use  . Smoking status: Former Smoker    Packs/day: 2.00    Years: 10.00    Pack years: 20.00    Types: Cigarettes  . Smokeless tobacco: Never Used  . Tobacco comment: Felecia Shelling  smoking 20 years ago  Substance and Sexual Activity  . Alcohol use: Yes    Comment: occ  . Drug use: No  . Sexual activity: Yes  Other Topics Concern  . Not on file  Social History Narrative  . Not on file   Social Determinants of Health   Financial Resource Strain:   . Difficulty of Paying Living Expenses:   Food Insecurity:   . Worried About Programme researcher, broadcasting/film/video in the Last Year:   . Barista in the Last Year:   Transportation Needs:   . Freight forwarder (Medical):   Marland Kitchen Lack of Transportation (Non-Medical):   Physical Activity:   . Days of Exercise per Week:    . Minutes of Exercise per Session:   Stress:   . Feeling of Stress :   Social Connections:   . Frequency of Communication with Friends and Family:   . Frequency of Social Gatherings with Friends and Family:   . Attends Religious Services:   . Active Member of Clubs or Organizations:   . Attends Banker Meetings:   Marland Kitchen Marital Status:      Family History: The patient's ***family history includes Cancer in his mother; Heart disease (age of onset: 79) in his father; Hypertension in his father. ROS:   Please see the history of present illness.    All other systems reviewed and are negative.  EKGs/Labs/Other Studies Reviewed:    The following studies were reviewed today:  EKG:  EKG ordered today and personally reviewed.  The ekg ordered today demonstrates ***  Recent Labs: 04/23/2018: Magnesium 1.9; TSH 2.850 05/15/2018: ALT 44; NT-Pro BNP 95 02/01/2019: B Natriuretic Peptide 20.5; BUN 17; Creatinine, Ser 0.87; Hemoglobin 16.1; Platelets 296; Potassium 4.5; Sodium 137  Recent Lipid Panel    Component Value Date/Time   CHOL 124 06/12/2018 0835   TRIG 134 06/12/2018 0835   HDL 44 06/12/2018 0835   CHOLHDL 2.8 06/12/2018 0835   CHOLHDL 4.0 04/23/2018 1547   VLDL 35 04/23/2018 1547   LDLCALC 53 06/12/2018 0835    Physical Exam:    VS:  There were no vitals taken for this visit.    Wt Readings from Last 3 Encounters:  03/05/19 (!) 374 lb (169.6 kg)  02/01/19 (!) 370 lb (167.8 kg)  10/30/18 (!) 360 lb 12.8 oz (163.7 kg)     GEN: *** Well nourished, well developed in no acute distress HEENT: Normal NECK: No JVD; No carotid bruits LYMPHATICS: No lymphadenopathy CARDIAC: ***RRR, no murmurs, rubs, gallops RESPIRATORY:  Clear to auscultation without rales, wheezing or rhonchi  ABDOMEN: Soft, non-tender, non-distended MUSCULOSKELETAL:  No edema; No deformity  SKIN: Warm and dry NEUROLOGIC:  Alert and oriented x 3 PSYCHIATRIC:  Normal affect    Signed, Norman Herrlich, MD  04/11/2019 5:27 PM    [facility]

## 2019-04-12 ENCOUNTER — Ambulatory Visit: Payer: BC Managed Care – PPO | Admitting: Cardiology

## 2019-04-16 DIAGNOSIS — R0989 Other specified symptoms and signs involving the circulatory and respiratory systems: Secondary | ICD-10-CM | POA: Diagnosis not present

## 2019-04-16 DIAGNOSIS — R498 Other voice and resonance disorders: Secondary | ICD-10-CM | POA: Diagnosis not present

## 2019-04-16 DIAGNOSIS — R05 Cough: Secondary | ICD-10-CM | POA: Diagnosis not present

## 2019-04-16 DIAGNOSIS — R6889 Other general symptoms and signs: Secondary | ICD-10-CM | POA: Diagnosis not present

## 2019-05-06 DIAGNOSIS — K219 Gastro-esophageal reflux disease without esophagitis: Secondary | ICD-10-CM

## 2019-05-06 DIAGNOSIS — I10 Essential (primary) hypertension: Secondary | ICD-10-CM | POA: Diagnosis not present

## 2019-05-06 DIAGNOSIS — J309 Allergic rhinitis, unspecified: Secondary | ICD-10-CM | POA: Diagnosis not present

## 2019-05-06 DIAGNOSIS — R7303 Prediabetes: Secondary | ICD-10-CM | POA: Diagnosis not present

## 2019-05-06 HISTORY — DX: Gastro-esophageal reflux disease without esophagitis: K21.9

## 2019-05-19 DIAGNOSIS — B029 Zoster without complications: Secondary | ICD-10-CM | POA: Diagnosis not present

## 2019-05-20 DIAGNOSIS — B029 Zoster without complications: Secondary | ICD-10-CM

## 2019-05-20 HISTORY — DX: Zoster without complications: B02.9

## 2019-05-26 ENCOUNTER — Other Ambulatory Visit: Payer: Self-pay | Admitting: Cardiology

## 2019-06-12 ENCOUNTER — Telehealth: Payer: Self-pay | Admitting: Cardiology

## 2019-06-12 NOTE — Telephone Encounter (Signed)
Patient returned call about confirming appt for Monday 06/17/19

## 2019-06-16 NOTE — Progress Notes (Signed)
Cardiology Office Note:    Date:  06/17/2019   ID:  [name], DOB 03-Apr-1965, MRN [id number]  PCP:  Algis Greenhouse, MD  Cardiologist:  Shirlee More, MD    Referring MD: Algis Greenhouse, MD    ASSESSMENT:    1. CAD in native artery   2. Essential hypertension   3. Mixed hyperlipidemia   4. Type 2 diabetes mellitus without complication, with long-term current use of insulin (HCC)    PLAN:    In order of problems listed above:  1. Stable now with more than a year remote from PCI for MI discontinue a second antiplatelet drug and continue current medical therapy lipid-lowering with high intensity statin and Zetia and beta-blocker. 2. BP at target continue current treatment including ARB and thiazide diuretic check renal function potassium 3. Continue statin Zetia check liver function lipid profile 4. New problem on appropriate cardioprotective medication with Metformin   Next appointment: 6 months   Medication Adjustments/Labs and Tests Ordered: Current medicines are reviewed at length with the patient today.  Concerns regarding medicines are outlined above.  No orders of the defined types were placed in this encounter.  No orders of the defined types were placed in this encounter.   Chief Complaint  Patient presents with  . Follow-up  . Coronary Artery Disease    History of Present Illness:    Brandon [name] is a 54 y.o. male with a hx of  morbid obesity s/p gastric bypass, HTN, HLD, CAD with NSTEMI and PCI stent RCA and marginal branch (04/23/18)  last seen 03/05/2019. Compliance with diet, lifestyle and medications: Yes  He developed type 2 diabetes and is now on Metformin well-tolerated.  He has had herpes zoster in the interim he is overdue for labs for lipids will check CMP lipid profile today we will plan to see him in 6 months.  He has had no angina dyspnea palpitation or syncope is more than a year remote from PCI we will discontinue prasugrel. Hgb A1C  05/06/2019: 7.8% Past Medical History:  Diagnosis Date  . ACS (acute coronary syndrome) ([place]) 04/2018  . Acute coronary syndrome ([place]) 04/23/2018  . Arthritis    both knees  . Arthritis of knee, degenerative 05/14/2015  . CAD in native artery 05/06/2018  . Chest pain 06/23/2018  . Chronic right shoulder pain 05/28/2015  . Complication of anesthesia    " i HAVE A HARD TIME WAKING UP "  . Coronary artery disease   . Essential hypertension 05/14/2015  . Facial twitching 09/10/2018  . Hypertension   . Mixed hyperlipidemia 05/14/2015  . Non-ST elevation (NSTEMI) myocardial infarction ([place])   . Pre-diabetes 03/30/2016   2017: x/6.7 2018: 103/6.4  . Right elbow pain 05/28/2015  . SOB (shortness of breath) on exertion 05/16/2018    Past Surgical History:  Procedure Laterality Date  . CORONARY STENT INTERVENTION N/A 04/23/2018   Procedure: CORONARY STENT INTERVENTION;  Surgeon: Sherren Mocha, MD;  Location: [facility];  Service: Cardiovascular;  Laterality: N/A;  . EXCISION HAGLUND'S DEFORMITY WITH ACHILLES TENDON REPAIR Right 05/22/2014   Procedure: RIGHT ACHILLES DEBRIDEMENT; HAGLUND'S EXCISION ;  Surgeon: Wylene Simmer, MD;  Location: [facility];  Service: Orthopedics;  Laterality: Right;  . GASTROCNEMIUS RECESSION Right 05/22/2014   Procedure: GASTROC RECESSION ;  Surgeon: Wylene Simmer, MD;  Location: [facility];  Service: Orthopedics;  Laterality: Right;  . KNEE SURGERY     l/knee  . LAPAROSCOPIC GASTRIC  SLEEVE RESECTION    . LEFT HEART CATH AND CORONARY ANGIOGRAPHY N/A 04/23/2018   Procedure: LEFT HEART CATH AND CORONARY ANGIOGRAPHY;  Surgeon: Tonny Bollman, MD;  [facility] IN[facility];  Service: Cardiovascular;  Laterality: N/A;  . MOUTH SURGERY     Root canal    Current Medications: Current Meds  Medication Sig  . aspirin 81 MG chewable tablet Chew 1 tablet (81 mg total) by mouth daily.  Marland Kitchen atorvastatin (LIPITOR) 80 MG tablet TAKE 1 TABLET BY  MOUTH DAILY AT 6 PM.  . ezetimibe (ZETIA) 10 MG tablet Take 1 tablet (10 mg total) by mouth daily.  . metFORMIN (GLUCOPHAGE) 500 MG tablet Take 500 mg by mouth 2 (two) times daily.  . metoprolol tartrate (LOPRESSOR) 25 MG tablet TAKE 1 TABLET BY MOUTH TWICE A DAY  . montelukast (SINGULAIR) 10 MG tablet Take 10 mg by mouth daily.  . Multiple Vitamin (MULTIVITAMIN WITH MINERALS) TABS tablet Take 1 tablet by mouth daily.  . nitroGLYCERIN (NITROSTAT) 0.4 MG SL tablet Place 1 tablet (0.4 mg total) under the tongue every 5 (five) minutes as needed for chest pain.  . pantoprazole (PROTONIX) 40 MG tablet Take 40 mg by mouth daily.  . prasugrel (EFFIENT) 10 MG TABS tablet TAKE 60 MG (6 TABS) FOR 2 DAYS THEN START 10 MG DAILY  . telmisartan-hydrochlorothiazide (MICARDIS HCT) 40-12.5 MG tablet Take 1 tablet by mouth daily.  . traMADol (ULTRAM) 50 MG tablet Take 50 mg by mouth every 6 (six) hours as needed.  . Vitamin D, Ergocalciferol, (DRISDOL) 1.25 MG (50000 UNIT) CAPS capsule Take 50,000 Units by mouth once a week.     Allergies:   Brilinta [ticagrelor]   Social History   Socioeconomic History  . Marital status: Married    Spouse name: Not on file  . Number of children: Not on file  . Years of education: Not on file  . Highest education level: Not on file  Occupational History  . Not on file  Tobacco Use  . Smoking status: Former Smoker    Packs/day: 2.00    Years: 10.00    Pack years: 20.00    Types: Cigarettes  . Smokeless tobacco: Never Used  . Tobacco comment: Quti smoking 20 years ago  Substance and Sexual Activity  . Alcohol use: Yes    Comment: occ  . Drug use: No  . Sexual activity: Yes  Other Topics Concern  . Not on file  Social History Narrative  . Not on file   Social Determinants of Health   Financial Resource Strain:   . Difficulty of Paying Living Expenses:   Food Insecurity:   . Worried About Programme researcher, broadcasting/film/video in the Last Year:   . Barista in the  Last Year:   Transportation Needs:   . Freight forwarder (Medical):   Marland Kitchen Lack of Transportation (Non-Medical):   Physical Activity:   . Days of Exercise per Week:   . Minutes of Exercise per Session:   Stress:   . Feeling of Stress :   Social Connections:   . Frequency of Communication with Friends and Family:   . Frequency of Social Gatherings with Friends and Family:   . Attends Religious Services:   . Active Member of Clubs or Organizations:   . Attends Banker Meetings:   Marland Kitchen Marital Status:      Family History: The patient's family history includes Cancer in his mother; Heart disease (age of  onset: 38) in his father; Hypertension in his father. ROS:   Please see the history of present illness.    All other systems reviewed and are negative.  EKGs/Labs/Other Studies Reviewed:    The following studies were reviewed today:   Recent Labs: 02/01/2019: B Natriuretic Peptide 20.5; BUN 17; Creatinine, Ser 0.87; Hemoglobin 16.1; Platelets 296; Potassium 4.5; Sodium 137  Recent Lipid Panel    Component Value Date/Time   CHOL 124 06/12/2018 0835   TRIG 134 06/12/2018 0835   HDL 44 06/12/2018 0835   CHOLHDL 2.8 06/12/2018 0835   CHOLHDL 4.0 04/23/2018 1547   VLDL 35 04/23/2018 1547   LDLCALC 53 06/12/2018 0835    Physical Exam:    VS:  BP 122/72 (BP Location: Right Arm, Patient Position: Sitting, Cuff Size: Normal)   Pulse 78   Ht 6\' 2"  (1.88 m)   Wt (!) 365 lb 12.8 oz (165.9 kg)   SpO2 95%   BMI 46.97 kg/m     Wt Readings from Last 3 Encounters:  06/17/19 (!) 365 lb 12.8 oz (165.9 kg)  03/05/19 (!) 374 lb (169.6 kg)  02/01/19 (!) 370 lb (167.8 kg)     GEN: Obese well nourished, well developed in no acute distress HEENT: Normal NECK: No JVD; No carotid bruits LYMPHATICS: No lymphadenopathy CARDIAC: RRR, no murmurs, rubs, gallops RESPIRATORY:  Clear to auscultation without rales, wheezing or rhonchi  ABDOMEN: Soft, non-tender,  non-distended MUSCULOSKELETAL:  No edema; No deformity  SKIN: Warm and dry NEUROLOGIC:  Alert and oriented x 3 PSYCHIATRIC:  Normal affect    Signed, 02/03/19, MD  06/17/2019 3:08 PM    [facility]

## 2019-06-17 ENCOUNTER — Other Ambulatory Visit: Payer: Self-pay

## 2019-06-17 ENCOUNTER — Ambulatory Visit (INDEPENDENT_AMBULATORY_CARE_PROVIDER_SITE_OTHER): Payer: BC Managed Care – PPO | Admitting: Cardiology

## 2019-06-17 ENCOUNTER — Encounter: Payer: Self-pay | Admitting: Cardiology

## 2019-06-17 VITALS — BP 122/72 | HR 78 | Ht 74.0 in | Wt 365.8 lb

## 2019-06-17 DIAGNOSIS — Z794 Long term (current) use of insulin: Secondary | ICD-10-CM

## 2019-06-17 DIAGNOSIS — I2510 Atherosclerotic heart disease of native coronary artery without angina pectoris: Secondary | ICD-10-CM | POA: Diagnosis not present

## 2019-06-17 DIAGNOSIS — E782 Mixed hyperlipidemia: Secondary | ICD-10-CM | POA: Diagnosis not present

## 2019-06-17 DIAGNOSIS — E119 Type 2 diabetes mellitus without complications: Secondary | ICD-10-CM | POA: Diagnosis not present

## 2019-06-17 DIAGNOSIS — I10 Essential (primary) hypertension: Secondary | ICD-10-CM

## 2019-06-17 NOTE — Patient Instructions (Signed)

## 2019-06-18 ENCOUNTER — Telehealth: Payer: Self-pay

## 2019-06-18 LAB — LIPID PANEL
Chol/HDL Ratio: 2.8 ratio (ref 0.0–5.0)
Cholesterol, Total: 121 mg/dL (ref 100–199)
HDL: 44 mg/dL (ref >39)
LDL Chol Calc (NIH): 57 mg/dL (ref 0–99)
Triglycerides: 112 mg/dL (ref 0–149)
VLDL Cholesterol Cal: 20 mg/dL (ref 5–40)

## 2019-06-18 LAB — COMPREHENSIVE METABOLIC PANEL WITH GFR
ALT: 52 IU/L — ABNORMAL HIGH (ref 0–44)
AST: 32 IU/L (ref 0–40)
Albumin/Globulin Ratio: 2 (ref 1.2–2.2)
Albumin: 4.7 g/dL (ref 3.8–4.9)
Alkaline Phosphatase: 65 IU/L (ref 48–121)
BUN/Creatinine Ratio: 16 (ref 9–20)
BUN: 14 mg/dL (ref 6–24)
Bilirubin Total: 0.6 mg/dL (ref 0.0–1.2)
CO2: 21 mmol/L (ref 20–29)
Calcium: 9.8 mg/dL (ref 8.7–10.2)
Chloride: 101 mmol/L (ref 96–106)
Creatinine, Ser: 0.88 mg/dL (ref 0.76–1.27)
GFR calc Af Amer: 113 mL/min/1.73
GFR calc non Af Amer: 97 mL/min/1.73
Globulin, Total: 2.4 g/dL (ref 1.5–4.5)
Glucose: 106 mg/dL — ABNORMAL HIGH (ref 65–99)
Potassium: 4.4 mmol/L (ref 3.5–5.2)
Sodium: 139 mmol/L (ref 134–144)
Total Protein: 7.1 g/dL (ref 6.0–8.5)

## 2019-06-18 NOTE — Telephone Encounter (Signed)
Spoke with patient regarding results and recommendation.  Patient verbalizes understanding and is agreeable to plan of care. Advised patient to call back with any issues or concerns.  

## 2019-06-18 NOTE — Telephone Encounter (Signed)
-----   Message from Baldo Daub, MD sent at 06/18/2019  7:35 AM EDT ----- Normal or stable result  No changes

## 2019-07-15 DIAGNOSIS — R300 Dysuria: Secondary | ICD-10-CM | POA: Diagnosis not present

## 2019-07-15 DIAGNOSIS — R319 Hematuria, unspecified: Secondary | ICD-10-CM | POA: Diagnosis not present

## 2019-07-15 DIAGNOSIS — N3001 Acute cystitis with hematuria: Secondary | ICD-10-CM | POA: Diagnosis not present

## 2019-07-16 DIAGNOSIS — R310 Gross hematuria: Secondary | ICD-10-CM

## 2019-07-16 HISTORY — DX: Gross hematuria: R31.0

## 2019-08-16 DIAGNOSIS — E782 Mixed hyperlipidemia: Secondary | ICD-10-CM | POA: Diagnosis not present

## 2019-08-16 DIAGNOSIS — I10 Essential (primary) hypertension: Secondary | ICD-10-CM | POA: Diagnosis not present

## 2019-08-16 DIAGNOSIS — E119 Type 2 diabetes mellitus without complications: Secondary | ICD-10-CM | POA: Diagnosis not present

## 2019-08-16 DIAGNOSIS — K219 Gastro-esophageal reflux disease without esophagitis: Secondary | ICD-10-CM | POA: Diagnosis not present

## 2019-08-29 ENCOUNTER — Other Ambulatory Visit: Payer: Self-pay | Admitting: Cardiology

## 2019-08-30 ENCOUNTER — Other Ambulatory Visit: Payer: Self-pay | Admitting: Cardiology

## 2019-09-22 DIAGNOSIS — Z20828 Contact with and (suspected) exposure to other viral communicable diseases: Secondary | ICD-10-CM | POA: Diagnosis not present

## 2019-09-22 DIAGNOSIS — R5381 Other malaise: Secondary | ICD-10-CM | POA: Diagnosis not present

## 2019-09-24 ENCOUNTER — Emergency Department (HOSPITAL_BASED_OUTPATIENT_CLINIC_OR_DEPARTMENT_OTHER)
Admission: EM | Admit: 2019-09-24 | Discharge: 2019-09-24 | Disposition: A | Discharge status: Home / Self Care | Payer: BC Managed Care – PPO | Attending: Emergency Medicine | Admitting: Emergency Medicine

## 2019-09-24 ENCOUNTER — Ambulatory Visit
Admission: EM | Admit: 2019-09-24 | Discharge: 2019-09-24 | Discharge status: Absent without leave | Payer: BC Managed Care – PPO

## 2019-09-24 ENCOUNTER — Telehealth: Payer: Self-pay | Admitting: Cardiology

## 2019-09-24 ENCOUNTER — Encounter (HOSPITAL_BASED_OUTPATIENT_CLINIC_OR_DEPARTMENT_OTHER): Payer: Self-pay | Admitting: Emergency Medicine

## 2019-09-24 ENCOUNTER — Other Ambulatory Visit: Payer: Self-pay

## 2019-09-24 ENCOUNTER — Emergency Department (HOSPITAL_BASED_OUTPATIENT_CLINIC_OR_DEPARTMENT_OTHER): Payer: BC Managed Care – PPO

## 2019-09-24 DIAGNOSIS — Z79899 Other long term (current) drug therapy: Secondary | ICD-10-CM | POA: Insufficient documentation

## 2019-09-24 DIAGNOSIS — I10 Essential (primary) hypertension: Secondary | ICD-10-CM | POA: Diagnosis not present

## 2019-09-24 DIAGNOSIS — Z7982 Long term (current) use of aspirin: Secondary | ICD-10-CM | POA: Diagnosis not present

## 2019-09-24 DIAGNOSIS — R7303 Prediabetes: Secondary | ICD-10-CM | POA: Diagnosis not present

## 2019-09-24 DIAGNOSIS — M79604 Pain in right leg: Secondary | ICD-10-CM | POA: Diagnosis not present

## 2019-09-24 DIAGNOSIS — Z87891 Personal history of nicotine dependence: Secondary | ICD-10-CM | POA: Diagnosis not present

## 2019-09-24 DIAGNOSIS — Z20822 Contact with and (suspected) exposure to covid-19: Secondary | ICD-10-CM | POA: Diagnosis not present

## 2019-09-24 DIAGNOSIS — M7918 Myalgia, other site: Secondary | ICD-10-CM | POA: Insufficient documentation

## 2019-09-24 DIAGNOSIS — Z9861 Coronary angioplasty status: Secondary | ICD-10-CM | POA: Diagnosis not present

## 2019-09-24 DIAGNOSIS — I82431 Acute embolism and thrombosis of right popliteal vein: Secondary | ICD-10-CM | POA: Diagnosis not present

## 2019-09-24 DIAGNOSIS — I82401 Acute embolism and thrombosis of unspecified deep veins of right lower extremity: Secondary | ICD-10-CM | POA: Diagnosis not present

## 2019-09-24 DIAGNOSIS — I214 Non-ST elevation (NSTEMI) myocardial infarction: Secondary | ICD-10-CM | POA: Insufficient documentation

## 2019-09-24 DIAGNOSIS — I82491 Acute embolism and thrombosis of other specified deep vein of right lower extremity: Secondary | ICD-10-CM | POA: Insufficient documentation

## 2019-09-24 DIAGNOSIS — Z7901 Long term (current) use of anticoagulants: Secondary | ICD-10-CM | POA: Insufficient documentation

## 2019-09-24 DIAGNOSIS — I2510 Atherosclerotic heart disease of native coronary artery without angina pectoris: Secondary | ICD-10-CM | POA: Diagnosis not present

## 2019-09-24 DIAGNOSIS — Z7984 Long term (current) use of oral hypoglycemic drugs: Secondary | ICD-10-CM | POA: Diagnosis not present

## 2019-09-24 DIAGNOSIS — M79605 Pain in left leg: Secondary | ICD-10-CM

## 2019-09-24 DIAGNOSIS — I82441 Acute embolism and thrombosis of right tibial vein: Secondary | ICD-10-CM | POA: Diagnosis not present

## 2019-09-24 LAB — COMPREHENSIVE METABOLIC PANEL
ALT: 59 U/L — ABNORMAL HIGH (ref 0–44)
AST: 30 U/L (ref 15–41)
Albumin: 4.3 g/dL (ref 3.5–5.0)
Alkaline Phosphatase: 44 U/L (ref 38–126)
Anion gap: 11 (ref 5–15)
BUN: 16 mg/dL (ref 6–20)
CO2: 25 mmol/L (ref 22–32)
Calcium: 9.4 mg/dL (ref 8.9–10.3)
Chloride: 100 mmol/L (ref 98–111)
Creatinine, Ser: 0.77 mg/dL (ref 0.61–1.24)
GFR calc Af Amer: >60 mL/min (ref >60)
GFR calc non Af Amer: >60 mL/min (ref >60)
Glucose, Bld: 104 mg/dL — ABNORMAL HIGH (ref 70–99)
Potassium: 4.5 mmol/L (ref 3.5–5.1)
Sodium: 136 mmol/L (ref 135–145)
Total Bilirubin: 0.6 mg/dL (ref 0.3–1.2)
Total Protein: 7.4 g/dL (ref 6.5–8.1)

## 2019-09-24 LAB — CBC WITH DIFFERENTIAL/PLATELET
Abs Immature Granulocytes: 0.03 10*3/uL (ref 0.00–0.07)
Basophils Absolute: 0.1 10*3/uL (ref 0.0–0.1)
Basophils Relative: 1 %
Eosinophils Absolute: 0.4 10*3/uL (ref 0.0–0.5)
Eosinophils Relative: 4 %
HCT: 48.1 % (ref 39.0–52.0)
Hemoglobin: 16.1 g/dL (ref 13.0–17.0)
Immature Granulocytes: 0 %
Lymphocytes Relative: 31 %
Lymphs Abs: 2.6 10*3/uL (ref 0.7–4.0)
MCH: 31.4 pg (ref 26.0–34.0)
MCHC: 33.5 g/dL (ref 30.0–36.0)
MCV: 93.9 fL (ref 80.0–100.0)
Monocytes Absolute: 0.8 10*3/uL (ref 0.1–1.0)
Monocytes Relative: 10 %
Neutro Abs: 4.6 10*3/uL (ref 1.7–7.7)
Neutrophils Relative %: 54 %
Platelets: 276 10*3/uL (ref 150–400)
RBC: 5.12 MIL/uL (ref 4.22–5.81)
RDW: 13 % (ref 11.5–15.5)
WBC: 8.4 10*3/uL (ref 4.0–10.5)
nRBC: 0 % (ref 0.0–0.2)

## 2019-09-24 LAB — SARS CORONAVIRUS 2 BY RT PCR (HOSPITAL ORDER, PERFORMED IN ~~LOC~~ HOSPITAL LAB): SARS Coronavirus 2: NEGATIVE

## 2019-09-24 MED ORDER — RIVAROXABAN (XARELTO) VTE STARTER PACK (15 & 20 MG): 15.0000 mg | ORAL_TABLET | Freq: Two times a day (BID) | ORAL | 0 refills | Status: DC

## 2019-09-24 MED ORDER — RIVAROXABAN 15 MG PO TABS
ORAL_TABLET | ORAL | Status: AC
  Filled 2019-09-24: qty 1

## 2019-09-24 MED ORDER — RIVAROXABAN (XARELTO) EDUCATION KIT FOR DVT/PE PATIENTS: PACK | Freq: Once | Status: DC

## 2019-09-24 MED FILL — XARELTO STARTER PACK: 15 & 20 | 30 days supply | Qty: 51 | Fill #0

## 2019-09-24 NOTE — Discharge Instructions (Addendum)
Take Xarelto as prescribed and follow up with your doctor. Return to the ER for worsening or concerning symptoms.

## 2019-09-24 NOTE — Telephone Encounter (Signed)
Please set up bilateral lower extremity venous duplex regarding DVT.  If this is normal he likely has a primary knee problem and at times it can give you findings that mimic deep vein thrombosis.

## 2019-09-24 NOTE — ED Notes (Signed)
AVS reviewed with pt, instructed on going to pharmacy to obtain Rx and begin immediately due to dx finding, Opportunity for questions provided, Copy of AVS provided and Starter Pac also provided

## 2019-09-24 NOTE — Telephone Encounter (Signed)
Called patient. He reports that since last Thursday his right knee has been causing him pain. It is behind his knee and almost into his calf. No swelling, redness, or warmth to the area just pain. He did have cold like symptoms Friday got a covid test it is negative but knee pain persist he is concerned about a blood clot. He did have injury to that knee years ago but it hasn't bothered him in a long time. Will consult with Dr. Dulce Sellar.

## 2019-09-24 NOTE — Telephone Encounter (Signed)
Spoke to patient just now and he let me know that he was already on the way to [facility] to be evaluated. Dr. Dulce Sellar stated to go ahead and proceed to the ED so that he can be evaluated. Patient verbalizes understanding and thanks me for the call back.

## 2019-09-24 NOTE — ED Provider Notes (Signed)
[facility] EMERGENCY DEPARTMENT Provider Note   CSN: [id number] Arrival date & time: 09/24/19  1219     History Chief Complaint  Patient presents with  . Leg Pain  . URI    Brandon [name] is a 54 y.o. male.  Brandon [name] 54 year old male with a PMH significant for HTN, HLD, T2DM, and ACS that presents with a chief complaint of right leg pain. The pain started Thursday evening and has been present since. He states that the pain is directly under his left knee and extending into his calf that feels like a tightness or blockage. He rates the pain as a 4/10 when his knee is flexed but 0/10 when knee is extended. Patient states that he was leaving the grocery store Thursday evening when he noticed the right leg pain and started to feel really run down and tired like he was developing a cold. He tried to go to work on Friday and came home feeling poorly. Patient reports that he slept and laid around from Friday evening through Sunday. He was still feeling poorly and went and got a rapid covid test in [place] that was negative on 09/22/19. He states that he has noticed that eating also makes him feel worse. He eats and then immediately feels nauseous, fatigued, and has pain in his lower abdomen. Patient has been vaccinated for covid-19. Denies cough, chest pain, or SOB. Does endorse some mild diarrhea since Thursday.        Past Medical History:  Diagnosis Date  . ACS (acute coronary syndrome) ([place]) 04/2018  . Acute coronary syndrome ([place]) 04/23/2018  . Arthritis    both knees  . Arthritis of knee, degenerative 05/14/2015  . CAD in native artery 05/06/2018  . Chest pain 06/23/2018  . Chronic right shoulder pain 05/28/2015  . Complication of anesthesia    " i HAVE A HARD TIME WAKING UP "  . Coronary artery disease   . Essential hypertension 05/14/2015  . Facial twitching 09/10/2018  . Hypertension   . Mixed hyperlipidemia 05/14/2015  . Non-ST elevation (NSTEMI) myocardial  infarction ([place])   . Pre-diabetes 03/30/2016   2017: x/6.7 2018: 103/6.4  . Right elbow pain 05/28/2015  . SOB (shortness of breath) on exertion 05/16/2018    Patient Active Problem List   Diagnosis Date Noted  . Facial twitching 09/10/2018  . Chest pain 06/23/2018  . SOB (shortness of breath) on exertion 05/16/2018  . CAD in native artery 05/06/2018  . Acute coronary syndrome ([place]) 04/23/2018  . Non-ST elevation (NSTEMI) myocardial infarction ([place])   . Pre-diabetes 03/30/2016  . Chronic right shoulder pain 05/28/2015  . Right elbow pain 05/28/2015  . Essential hypertension 05/14/2015  . Mixed hyperlipidemia 05/14/2015  . Arthritis of knee, degenerative 05/14/2015    Past Surgical History:  Procedure Laterality Date  . CORONARY STENT INTERVENTION N/A 04/23/2018   Procedure: CORONARY STENT INTERVENTION;  Surgeon: Sherren Mocha, MD;  Location: [facility];  Service: Cardiovascular;  Laterality: N/A;  . EXCISION HAGLUND'S DEFORMITY WITH ACHILLES TENDON REPAIR Right 05/22/2014   Procedure: RIGHT ACHILLES DEBRIDEMENT; HAGLUND'S EXCISION ;  Surgeon: Wylene Simmer, MD;  Location: [facility];  Service: Orthopedics;  Laterality: Right;  . GASTROCNEMIUS RECESSION Right 05/22/2014   Procedure: GASTROC RECESSION ;  Surgeon: Wylene Simmer, MD;  Location: [facility];  Service: Orthopedics;  Laterality: Right;  . KNEE SURGERY     l/knee  . LAPAROSCOPIC GASTRIC SLEEVE RESECTION    .  LEFT HEART CATH AND CORONARY ANGIOGRAPHY N/A 04/23/2018   Procedure: LEFT HEART CATH AND CORONARY ANGIOGRAPHY;  Surgeon: Sherren Mocha, MD;  Location: [facility];  Service: Cardiovascular;  Laterality: N/A;  . MOUTH SURGERY     Root canal       Family History  Problem Relation Age of Onset  . Cancer Mother   . Heart disease Father 28  . Hypertension Father     Social History   Tobacco Use  . Smoking status: Former Smoker    Packs/day: 2.00    Years: 10.00    Pack  years: 20.00    Types: Cigarettes  . Smokeless tobacco: Never Used  . Tobacco comment: Quti smoking 20 years ago  Vaping Use  . Vaping Use: Never used  Substance Use Topics  . Alcohol use: Yes    Comment: occ  . Drug use: No    Home Medications Prior to Admission medications   Medication Sig Start Date End Date Taking? Authorizing Provider  aspirin 81 MG chewable tablet Chew 1 tablet (81 mg total) by mouth daily. 04/24/18   Barrett, Evelene Croon, PA-C  atorvastatin (LIPITOR) 80 MG tablet TAKE 1 TABLET BY MOUTH DAILY AT 6 PM. 03/12/19   Munley, Hilton Cork, MD  ezetimibe (ZETIA) 10 MG tablet Take 1 tablet (10 mg total) by mouth daily. 03/05/19   Richardo Priest, MD  metFORMIN (GLUCOPHAGE) 500 MG tablet Take 500 mg by mouth 2 (two) times daily. 05/07/19   [provider]  metoprolol tartrate (LOPRESSOR) 25 MG tablet TAKE 1 TABLET BY MOUTH TWICE A DAY 05/27/19   Richardo Priest, MD  montelukast (SINGULAIR) 10 MG tablet Take 10 mg by mouth daily. 05/06/19   [provider]  Multiple Vitamin (MULTIVITAMIN WITH MINERALS) TABS tablet Take 1 tablet by mouth daily.    [provider]  nitroGLYCERIN (NITROSTAT) 0.4 MG SL tablet Place 1 tablet (0.4 mg total) under the tongue every 5 (five) minutes as needed for chest pain. 04/24/18   Barrett, Evelene Croon, PA-C  pantoprazole (PROTONIX) 40 MG tablet Take 40 mg by mouth daily. 05/06/19   [provider]  prasugrel (EFFIENT) 10 MG TABS tablet TAKE 60 MG (6 TABS) FOR 2 DAYS THEN START 10 MG DAILY 08/29/19   Richardo Priest, MD  predniSONE (DELTASONE) 50 MG tablet Take 50 mg by mouth daily. 05/20/19   [provider]  pregabalin (LYRICA) 75 MG capsule Take 75 mg by mouth 2 (two) times daily.    [provider]  RIVAROXABAN Alveda Reasons) VTE STARTER PACK (15 & 20 MG TABLETS) Take 15 mg by mouth 2 (two) times daily. Follow package directions: Take one 86m tablet by mouth twice a day. On day 22, switch to one 241mtablet once a  day. Take with food. 09/24/19   MuTacy LearnPA-C  telmisartan-hydrochlorothiazide (MICARDIS HCT) 40-12.5 MG tablet TAKE 1 TABLET BY MOUTH EVERY DAY 08/30/19   MuRichardo PriestMD  traMADol (ULTRAM) 50 MG tablet Take 50 mg by mouth every 6 (six) hours as needed. 05/20/19   [provider]  valACYclovir (VALTREX) 1000 MG tablet Take 1,000 mg by mouth 3 (three) times daily.    [provider]  Vitamin D, Ergocalciferol, (DRISDOL) 1.25 MG (50000 UNIT) CAPS capsule Take 50,000 Units by mouth once a week. 04/21/19   [provider]    Allergies    Brilinta [ticagrelor]  Review of Systems   Review of Systems  Constitutional: Positive for fatigue. Negative for chills and fever.  HENT: Negative for congestion.   Respiratory: Negative for cough and shortness of breath.   Cardiovascular: Positive for leg swelling. Negative for chest pain and palpitations.  Gastrointestinal: Positive for nausea.  Musculoskeletal: Positive for arthralgias and myalgias.  Allergic/Immunologic: Positive for immunocompromised state.    Physical Exam Updated Vital Signs BP 130/76 (BP Location: Right Arm)   Pulse 72   Temp 97.6 F (36.4 C) (Oral)   Resp 18   Ht 6' 2" (1.88 m)   Wt (!) 158.8 kg   SpO2 99%   BMI 44.94 kg/m   Physical Exam Vitals and nursing note reviewed.  Constitutional:      General: He is not in acute distress.    Appearance: He is well-developed. He is not diaphoretic.  HENT:     Head: Normocephalic and atraumatic.  Cardiovascular:     Rate and Rhythm: Normal rate and regular rhythm.     Pulses: Normal pulses.     Heart sounds: Normal heart sounds.  Pulmonary:     Effort: Pulmonary effort is normal.     Breath sounds: Normal breath sounds.  Abdominal:     Palpations: Abdomen is soft.     Tenderness: There is no abdominal tenderness.  Musculoskeletal:        General: No swelling or tenderness. Normal range of motion.     Right lower leg: No edema.      Left lower leg: No edema.  Skin:    General: Skin is warm and dry.     Capillary Refill: Capillary refill takes 2 to 3 seconds.     Findings: No erythema.  Neurological:     Mental Status: He is alert and oriented to person, place, and time.  Psychiatric:        Behavior: Behavior normal.     ED Results / Procedures / Treatments   Labs (all labs ordered are listed, but only abnormal results are displayed) Labs Reviewed  COMPREHENSIVE METABOLIC PANEL - Abnormal; Notable for the following components:      Result Value   Glucose, Bld 104 (*)    ALT 59 (*)    All other components within normal limits  SARS CORONAVIRUS 2 BY RT PCR (HOSPITAL ORDER, [facility])  CBC WITH DIFFERENTIAL/PLATELET  LYME DISEASE DNA BY PCR([facility])    EKG None  Radiology US Venous Img Lower Right (DVT Study)  Result Date: 09/24/2019 CLINICAL DATA:  Right leg swelling EXAM: RIGHT LOWER EXTREMITY VENOUS DOPPLER ULTRASOUND TECHNIQUE: Gray-scale sonography with compression, as well as color and duplex ultrasound, were performed to evaluate the deep venous system(s) from the level of the common femoral vein through the popliteal and proximal calf veins. COMPARISON:  None. FINDINGS: VENOUS Normal compressibility of the common femoral, superficial femoral, and popliteal veins. Visualized portions of profunda femoral vein and great saphenous vein unremarkable. No filling defects to suggest DVT on grayscale or color Doppler imaging. Doppler waveforms show normal direction of venous flow, normal respiratory plasticity and response to augmentation. Evaluation of the infrapopliteal venous vasculature demonstrates acute thrombosis of 1 of the paired posterior tibial veins of the right lower extremity. This does not extend into the popliteal vein itself. Remaining infrapopliteal venous vasculature is widely patent and demonstrates appropriate compressibility. Limited views of the contralateral  common femoral vein are unremarkable. OTHER None. Limitations: none IMPRESSION: 1. Acute infrapopliteal DVT involving the right posterior tibial veins. No popliteal  extension. . Electronically Signed   By: Fidela Salisbury MD   On: 09/24/2019 17:16    Procedures Procedures (including critical care time)  Medications Ordered in ED Medications  rivaroxaban Alveda Reasons) Education Kit for DVT/PE patients (has no administration in time range)    ED Course  I have reviewed the triage vital signs and the nursing notes.  Pertinent labs & imaging results that were available during my care of the patient were reviewed by me and considered in my medical decision making (see chart for details).  Clinical Course as of Sep 24 1815  Tue Sep 24, 3946  2085 54 year old male with complaint as above. On exam, well appearing, overweight male in no acute distress. No appretiable leg swelling although does have more prominent varicose veins to right lower leg compared to left. Calf soft and non tender, no palpable cords, no pain to posterior knee. Denies CP or SHOB. In regards to primary complaint of right knee pain, right leg doppler is + for acute right DVT, distal, not popliteal/proximal. Plan is to treat with Xarelto and recheck with PCP. Discussed risks with medication, patient to discuss continued ASA use with PCP. Discussed return to ER precautions. In regards to vomiting after eating, feeling generally unwell, no specific findings. Second COVID test today is negative, labs inclusing CBC and CMP without significant findings discussed lyme/alpha galactosidase, lyme pending, unable to complete alpha gala in the ED, recommend discuss with PCP, no tick bite history however.    [LM]    Clinical Course User Index [LM] Roque Lias   MDM Rules/Calculators/A&P                          Final Clinical Impression(s) / ED Diagnoses Final diagnoses:  Acute deep vein thrombosis (DVT) of other specified vein of  right lower extremity ([place])    Rx / DC Orders ED Discharge Orders         Ordered    RIVAROXABAN (XARELTO) VTE STARTER PACK (15 & 20 MG TABLETS)  2 times daily        09/24/19 1737           Tacy Learn, PA-C 09/24/19 1817    Charlesetta Shanks, MD 09/25/19 1731

## 2019-09-24 NOTE — ED Notes (Signed)
ED Provider at bedside. 

## 2019-09-24 NOTE — ED Notes (Signed)
Placed on cont cardiac monitoring with cont POX and Int NBP assessment

## 2019-09-24 NOTE — ED Notes (Signed)
Has rt lower extremity discomfort since Thursday, states he able to place weight on that leg as well, negative homan sign noted, RLE girth appears more than LLE. Both Rt and Lt pedal and post tib pulses required being doppler, areas marked. Good capillary refill also noted, normal sensation with strong plantar and dorsal flexion noted.

## 2019-09-24 NOTE — Telephone Encounter (Signed)
Patient states on 09/19/19, he suddenly had an episode that consisted of headaches, knee pain, and dizziness. He states he never felt as if he could pass out from the dizziness- he just felt as if the room was spinning. He states he has been resting since then and he feels better today. He had a COVID test on 09/22/19 and tested negative. He is concerned that the knee pain may have been sign of a blood clot. Please return call to advise.

## 2019-09-24 NOTE — ED Triage Notes (Signed)
Reports pain behind right knee on Thursday then started having cold symptoms.  Seen previously and given covid test negative on Sunday.  Now reports having some dizziness and just not feeling well.

## 2019-09-25 NOTE — Telephone Encounter (Signed)
Lets wait at least 2 weeks and then check ABIs in my office to screen for PAD

## 2019-09-25 NOTE — Addendum Note (Signed)
Addended by: Delorse Limber I on: 09/25/2019 12:20 PM   Modules accepted: Orders

## 2019-09-25 NOTE — Telephone Encounter (Signed)
Spoke to patient just now and let him know Dr. Hulen Shouts recommendation to do this test in two weeks. I will route to Brandon [name] to get this scheduled.

## 2019-09-25 NOTE — Telephone Encounter (Signed)
Patient states that he did have a blood clot in his Right leg. He states that he was started on Xarelto 30 mg for 21 days. Then he will be on Xarelto 20 mg daily.   He also set up an appointment with Dr. Dulce Sellar for 10/29/19 in [facility] as he said he would like to discuss possible having PAD.   I will route this to Dr. Dulce Sellar as an Lorain Childes

## 2019-09-25 NOTE — Telephone Encounter (Signed)
Patient calling to speak with Lequita Halt in regards to his ED visit. Please advise

## 2019-09-27 LAB — LYME DISEASE DNA BY PCR(BORRELIA BURG): Lyme Disease(B.burgdorferi)PCR: NEGATIVE

## 2019-10-17 ENCOUNTER — Ambulatory Visit (INDEPENDENT_AMBULATORY_CARE_PROVIDER_SITE_OTHER): Payer: BC Managed Care – PPO

## 2019-10-17 ENCOUNTER — Other Ambulatory Visit: Payer: Self-pay

## 2019-10-17 ENCOUNTER — Telehealth: Payer: Self-pay

## 2019-10-17 DIAGNOSIS — M79605 Pain in left leg: Secondary | ICD-10-CM

## 2019-10-17 DIAGNOSIS — M79604 Pain in right leg: Secondary | ICD-10-CM | POA: Diagnosis not present

## 2019-10-17 DIAGNOSIS — E782 Mixed hyperlipidemia: Secondary | ICD-10-CM | POA: Diagnosis not present

## 2019-10-17 NOTE — Telephone Encounter (Signed)
Spoke with patient regarding results and recommendation.  Patient verbalizes understanding and is agreeable to plan of care. Advised patient to call back with any issues or concerns.  

## 2019-10-17 NOTE — Progress Notes (Signed)
ABI exam performed.  Jimmy Kaori Jumper RDCS, RVT  

## 2019-10-21 DIAGNOSIS — I82441 Acute embolism and thrombosis of right tibial vein: Secondary | ICD-10-CM

## 2019-10-21 DIAGNOSIS — I82409 Acute embolism and thrombosis of unspecified deep veins of unspecified lower extremity: Secondary | ICD-10-CM

## 2019-10-21 HISTORY — DX: Acute embolism and thrombosis of right tibial vein: I82.441

## 2019-10-21 HISTORY — DX: Acute embolism and thrombosis of unspecified deep veins of unspecified lower extremity: I82.409

## 2019-10-28 DIAGNOSIS — M1990 Unspecified osteoarthritis, unspecified site: Secondary | ICD-10-CM | POA: Insufficient documentation

## 2019-10-28 DIAGNOSIS — I10 Essential (primary) hypertension: Secondary | ICD-10-CM | POA: Insufficient documentation

## 2019-10-28 DIAGNOSIS — I2510 Atherosclerotic heart disease of native coronary artery without angina pectoris: Secondary | ICD-10-CM | POA: Insufficient documentation

## 2019-10-28 DIAGNOSIS — T8859XA Other complications of anesthesia, initial encounter: Secondary | ICD-10-CM | POA: Insufficient documentation

## 2019-10-29 ENCOUNTER — Ambulatory Visit (INDEPENDENT_AMBULATORY_CARE_PROVIDER_SITE_OTHER): Payer: BC Managed Care – PPO | Admitting: Cardiology

## 2019-10-29 ENCOUNTER — Other Ambulatory Visit: Payer: Self-pay

## 2019-10-29 ENCOUNTER — Encounter: Payer: Self-pay | Admitting: Cardiology

## 2019-10-29 VITALS — BP 130/80 | HR 74 | Ht 74.0 in | Wt 358.0 lb

## 2019-10-29 DIAGNOSIS — E782 Mixed hyperlipidemia: Secondary | ICD-10-CM

## 2019-10-29 DIAGNOSIS — I2510 Atherosclerotic heart disease of native coronary artery without angina pectoris: Secondary | ICD-10-CM

## 2019-10-29 DIAGNOSIS — Z7901 Long term (current) use of anticoagulants: Secondary | ICD-10-CM | POA: Diagnosis not present

## 2019-10-29 DIAGNOSIS — I82461 Acute embolism and thrombosis of right calf muscular vein: Secondary | ICD-10-CM

## 2019-10-29 DIAGNOSIS — I10 Essential (primary) hypertension: Secondary | ICD-10-CM | POA: Diagnosis not present

## 2019-10-29 DIAGNOSIS — E119 Type 2 diabetes mellitus without complications: Secondary | ICD-10-CM

## 2019-10-29 DIAGNOSIS — Z794 Long term (current) use of insulin: Secondary | ICD-10-CM

## 2019-10-29 NOTE — Patient Instructions (Signed)
Medication Instructions:  Your physician recommends that you continue on your current medications as directed. Please refer to the Current Medication list given to you today.  When you finish your Xarelto please contact us about one week post your last dose! *If you need a refill on your cardiac medications before your next appointment, please call your pharmacy*   Lab Work: None If you have labs (blood work) drawn today and your tests are completely normal, you will receive your results only by: Marland Kitchen MyChart Message (if you have MyChart) OR . A paper copy in the mail If you have any lab test that is abnormal or we need to change your treatment, we will call you to review the results.   Testing/Procedures: None   Follow-Up: At [facility], you and your health needs are our priority.  As part of our continuing mission to provide you with exceptional heart care, we have created designated Provider Care Teams.  These Care Teams include your primary Cardiologist (physician) and Advanced Practice Providers (APPs -  Physician Assistants and Nurse Practitioners) who all work together to provide you with the care you need, when you need it.  We recommend signing up for the patient portal called "MyChart".  Sign up information is provided on this After Visit Summary.  MyChart is used to connect with patients for Virtual Visits (Telemedicine).  Patients are able to view lab/test results, encounter notes, upcoming appointments, etc.  Non-urgent messages can be sent to your provider as well.   To learn more about what you can do with MyChart, go to [email or web address].    Your next appointment:   6 month(s)  The format for your next appointment:   In Person  Provider:   Norman Herrlich, MD   Other Instructions

## 2019-10-29 NOTE — Progress Notes (Signed)
Cardiology Office Note:    Date:  10/29/2019   ID:  [name], DOB [birth year], MRN [id number]  PCP:  Olive Bass, MD  Cardiologist:  Norman Herrlich, MD    Referring MD: Olive Bass, MD    ASSESSMENT:    1. Chronic anticoagulation   2. Acute deep vein thrombosis (DVT) of calf muscle vein of right lower extremity (HCC)   3. CAD in native artery   4. Essential hypertension   5. Mixed hyperlipidemia   6. Type 2 diabetes mellitus without complication, with long-term current use of insulin (HCC)    PLAN:    In order of problems listed above:  1. He will continue his anticoagulant 90 days and afterwards he will call and I will put in orders for hypercoagulable work-up but I suspect the mechanism here his age as his predisposition. 2. Stable CAD continue medical therapy 3. Hypertension hyperlipidemia diabetes stable continue current therapy   Next appointment: 6 months   Medication Adjustments/Labs and Tests Ordered: Current medicines are reviewed at length with the patient today.  Concerns regarding medicines are outlined above.  No orders of the defined types were placed in this encounter.  No orders of the defined types were placed in this encounter.   Chief Complaint  Patient presents with  . Follow-up    Recent acute DVT right lower extremity infra popliteal presently is anticoagulated  . Coronary Artery Disease  . Hyperlipidemia  . Hypertension    History of Present Illness:    Brandon [name] is a 54 y.o. male with a hx of CAD with non-ST elevation MI PCI and stent to right coronary artery marginal branch 04/23/2018, morbid obesity with gastric bypass surgery hypertension hyperlipidemia and type 2 diabetes.  He was last seen 06/17/2019 and subsequently developed pain and swelling lower extremity was seen in the ED med [facility] 09/24/2019 and found to have acute infrapopliteal DVT involving the right posterior tibial veins and was  anticoagulated. Compliance with diet, lifestyle and medications: Yes  He had prompt resolution of leg swelling and pain with anticoagulant and no recurrence.  At his request he had ABIs performed that were normal.  No edema shortness of breath chest pain palpitation or syncope and no obvious precipitant of his deep vein thrombosis. Past Medical History:  Diagnosis Date  . ACS (acute coronary syndrome) (HCC) 04/2018  . Acute coronary syndrome (HCC) 04/23/2018  . Acute deep vein thrombosis (DVT) of tibial vein of right lower extremity (HCC) 10/21/2019   Formatting of this note might be different from the original. 09/24/2019: RIGHT LOWER EXTREMITY VENOUS DOPPLER ULTRASOUND Evaluation of the infrapopliteal venous vasculature demonstrates acute thrombosis of 1 of the paired posterior tibial veins of the right lower extremity. This does not extend into the popliteal vein itself  . Arthritis    both knees  . Arthritis of knee, degenerative 05/14/2015  . CAD in native artery 05/06/2018  . Chest pain 06/23/2018  . Chronic right shoulder pain 05/28/2015  . Complication of anesthesia    " i HAVE A HARD TIME WAKING UP "  . Coronary artery disease   . Essential hypertension 05/14/2015  . Facial twitching 09/10/2018  . Gross hematuria 07/16/2019   Formatting of this note might be different from the original. 2021  . Herpes zoster without complication 05/20/2019  . History of non-ST elevation myocardial infarction (NSTEMI) 07/30/2018  . Hypertension   . Laryngopharyngeal reflux (LPR) 05/06/2019  . Mixed hyperlipidemia 05/14/2015  .  Non-ST elevation (NSTEMI) myocardial infarction (HCC)   . Pre-diabetes 03/30/2016   2017: x/6.7 2018: 103/6.4  . Right elbow pain 05/28/2015  . SOB (shortness of breath) on exertion 05/16/2018  . Type 2 diabetes mellitus without complication, without long-term current use of insulin (HCC) 03/30/2016   Formatting of this note might be different from the original. 2017: x/6.7 2018: 103/6.4  2021: 7.8    Past Surgical History:  Procedure Laterality Date  . CORONARY STENT INTERVENTION N/A 04/23/2018   Procedure: CORONARY STENT INTERVENTION;  Surgeon: Tonny Bollman, MD;  Location: [facility] INVASIVE CV LAB;  Service: Cardiovascular;  Laterality: N/A;  . EXCISION HAGLUND'S DEFORMITY WITH ACHILLES TENDON REPAIR Right 05/22/2014   Procedure: RIGHT ACHILLES DEBRIDEMENT; HAGLUND'S EXCISION ;  Surgeon: Toni Arthurs, MD;  Location: [facility];  Service: Orthopedics;  Laterality: Right;  . GASTROCNEMIUS RECESSION Right 05/22/2014   Procedure: GASTROC RECESSION ;  Surgeon: Toni Arthurs, MD;  Location: [facility];  Service: Orthopedics;  Laterality: Right;  . KNEE SURGERY     l/knee  . LAPAROSCOPIC GASTRIC SLEEVE RESECTION    . LEFT HEART CATH AND CORONARY ANGIOGRAPHY N/A 04/23/2018   Procedure: LEFT HEART CATH AND CORONARY ANGIOGRAPHY;  Surgeon: Tonny Bollman, MD;  Location: [facility] INVASIVE CV LAB;  Service: Cardiovascular;  Laterality: N/A;  . MOUTH SURGERY     Root canal    Current Medications: Current Meds  Medication Sig  . aspirin 81 MG chewable tablet Chew 1 tablet (81 mg total) by mouth daily.  Marland Kitchen ezetimibe (ZETIA) 10 MG tablet Take 1 tablet (10 mg total) by mouth daily.  . metFORMIN (GLUCOPHAGE) 500 MG tablet Take 500 mg by mouth 2 (two) times daily.  . metoprolol tartrate (LOPRESSOR) 25 MG tablet TAKE 1 TABLET BY MOUTH TWICE A DAY  . Multiple Vitamin (MULTIVITAMIN WITH MINERALS) TABS tablet Take 1 tablet by mouth daily.  . nitroGLYCERIN (NITROSTAT) 0.4 MG SL tablet Place 1 tablet (0.4 mg total) under the tongue every 5 (five) minutes as needed for chest pain.  . rivaroxaban (XARELTO) 20 MG TABS tablet Take 20 tablets by mouth daily.  . rosuvastatin (CRESTOR) 40 MG tablet Take 40 mg by mouth daily.  Marland Kitchen telmisartan-hydrochlorothiazide (MICARDIS HCT) 40-12.5 MG tablet TAKE 1 TABLET BY MOUTH EVERY DAY  . Vitamin D, Ergocalciferol, (DRISDOL) 1.25 MG (50000 UNIT)  CAPS capsule Take 50,000 Units by mouth once a week.  . [DISCONTINUED] RIVAROXABAN (XARELTO) VTE STARTER PACK (15 & 20 MG TABLETS) Take 15 mg by mouth 2 (two) times daily. Follow package directions: Take one 15mg  tablet by mouth twice a day. On day 22, switch to one 20mg  tablet once a day. Take with food.     Allergies:   Brilinta [ticagrelor]   Social History   Socioeconomic History  . Marital status: Married    Spouse name: Not on file  . Number of children: Not on file  . Years of education: Not on file  . Highest education level: Not on file  Occupational History  . Not on file  Tobacco Use  . Smoking status: Former Smoker    Packs/day: 2.00    Years: 10.00    Pack years: 20.00    Types: Cigarettes  . Smokeless tobacco: Never Used  . Tobacco comment: Quti smoking 20 years ago  Vaping Use  . Vaping Use: Never used  Substance and Sexual Activity  . Alcohol use: Yes    Comment: occ  . Drug use: No  .  Sexual activity: Yes  Other Topics Concern  . Not on file  Social History Narrative  . Not on file   Social Determinants of Health   Financial Resource Strain:   . Difficulty of Paying Living Expenses: Not on file  Food Insecurity:   . Worried About Programme researcher, broadcasting/film/video in the Last Year: Not on file  . Ran Out of Food in the Last Year: Not on file  Transportation Needs:   . Lack of Transportation (Medical): Not on file  . Lack of Transportation (Non-Medical): Not on file  Physical Activity:   . Days of Exercise per Week: Not on file  . Minutes of Exercise per Session: Not on file  Stress:   . Feeling of Stress : Not on file  Social Connections:   . Frequency of Communication with Friends and Family: Not on file  . Frequency of Social Gatherings with Friends and Family: Not on file  . Attends Religious Services: Not on file  . Active Member of Clubs or Organizations: Not on file  . Attends Banker Meetings: Not on file  . Marital Status: Not on file      Family History: The patient's family history includes Cancer in his mother; Heart disease (age of onset: 87) in his father; Hypertension in his father. ROS:   Please see the history of present illness.    All other systems reviewed and are negative.  EKGs/Labs/Other Studies Reviewed:    The following studies were reviewed today:   Recent Labs: 02/01/2019: B Natriuretic Peptide 20.5 09/24/2019: ALT 59; BUN 16; Creatinine, Ser 0.77; Hemoglobin 16.1; Platelets 276; Potassium 4.5; Sodium 136  Recent Lipid Panel    Component Value Date/Time   CHOL 121 06/17/2019 1515   TRIG 112 06/17/2019 1515   HDL 44 06/17/2019 1515   CHOLHDL 2.8 06/17/2019 1515   CHOLHDL 4.0 04/23/2018 1547   VLDL 35 04/23/2018 1547   LDLCALC 57 06/17/2019 1515    Physical Exam:    VS:  BP 130/80   Pulse 74   Ht 6\' 2"  (1.88 m)   Wt (!) 358 lb 0.6 oz (162.4 kg)   SpO2 94%   BMI 45.97 kg/m     Wt Readings from Last 3 Encounters:  10/29/19 (!) 358 lb 0.6 oz (162.4 kg)  09/24/19 (!) 350 lb (158.8 kg)  06/17/19 (!) 365 lb 12.8 oz (165.9 kg)     GEN:  Well nourished, well developed in no acute distress HEENT: Normal NECK: No JVD; No carotid bruits LYMPHATICS: No lymphadenopathy CARDIAC: RRR, no murmurs, rubs, gallops RESPIRATORY:  Clear to auscultation without rales, wheezing or rhonchi  ABDOMEN: Soft, non-tender, non-distended MUSCULOSKELETAL:  No edema; No deformity  SKIN: Warm and dry NEUROLOGIC:  Alert and oriented x 3 PSYCHIATRIC:  Normal affect    Signed, 08/17/19, MD  10/29/2019 4:36 PM    [facility]

## 2019-12-23 ENCOUNTER — Ambulatory Visit: Payer: BC Managed Care – PPO | Admitting: Cardiology

## 2020-01-08 ENCOUNTER — Telehealth: Payer: Self-pay | Admitting: Cardiology

## 2020-01-08 NOTE — Telephone Encounter (Signed)
Returned call to pt.  Per Pt he was advised to call office when he stopped his Xarelto.  Per review of Dr. Hulen Shouts note: 1. He will continue his anticoagulant 90 days and afterwards he will call and I will put in orders for hypercoagulable work-up   Pt states he stopped the xarelto about 3 weeks ago and is calling to set up lab work.  Will forward to Dr. Dulce Sellar to order labs or instruct this nurse what labs to order.  Advised Pt would call him back with further information.

## 2020-01-08 NOTE — Telephone Encounter (Signed)
Patient is calling to speak to Dr. Dulce Sellar or his nurse about upcoming labs. Please advise.

## 2020-01-08 NOTE — Telephone Encounter (Signed)
Patient is following Dr. Hulen Shouts instructions.  When Dr. Dulce Sellar comes back he can review it and order the blood work that he wants to be done on this patient.

## 2020-01-13 NOTE — Telephone Encounter (Signed)
Orders entered

## 2020-01-13 NOTE — Addendum Note (Signed)
Addended by: Norman Herrlich on: 01/13/2020 01:28 PM   Modules accepted: Orders

## 2020-01-13 NOTE — Telephone Encounter (Signed)
Left message on patients voicemail to please return our call.   

## 2020-01-13 NOTE — Telephone Encounter (Signed)
Patient is returning call.  °

## 2020-01-13 NOTE — Telephone Encounter (Signed)
Spoke to the patient just now and let him know that the order has been placed for these labs and he can get them done anytime. He verbalizes understanding and thanks me for the call back.

## 2020-01-16 DIAGNOSIS — I82461 Acute embolism and thrombosis of right calf muscular vein: Secondary | ICD-10-CM | POA: Diagnosis not present

## 2020-01-17 LAB — PROTEIN S PANEL
Protein S Activity: 76 % (ref 63–140)
Protein S Ag, Free: 102 % (ref 61–136)
Protein S Ag, Total: 95 % (ref 60–150)

## 2020-01-17 LAB — PROTEIN C ACTIVITY: Protein C Activity: 190 % — ABNORMAL HIGH (ref 73–180)

## 2020-01-17 LAB — ANTITHROMBIN III: AntiThromb III Func: 102 % (ref 75–135)

## 2020-01-29 DIAGNOSIS — U071 COVID-19: Secondary | ICD-10-CM | POA: Diagnosis not present

## 2020-01-29 DIAGNOSIS — Z20822 Contact with and (suspected) exposure to covid-19: Secondary | ICD-10-CM | POA: Diagnosis not present

## 2020-01-29 DIAGNOSIS — R059 Cough, unspecified: Secondary | ICD-10-CM | POA: Diagnosis not present

## 2020-02-03 ENCOUNTER — Telehealth: Payer: Self-pay | Admitting: Cardiology

## 2020-02-03 DIAGNOSIS — U071 COVID-19: Secondary | ICD-10-CM

## 2020-02-03 HISTORY — DX: COVID-19: U07.1

## 2020-02-03 NOTE — Telephone Encounter (Signed)
Lets refer him to the respiratory clinic at [facility] for evaluation

## 2020-02-03 NOTE — Telephone Encounter (Signed)
Called and spoke to patient. He tested positive for covid on 01/29/20. He wants Dr. Hulen Shouts opinion on getting the monoclonal antibody infusion. He has read some conflicting information on it lately and wanted Dr. Hulen Shouts opinion. He isn't feeling very well, he is vaccinated against covid but still not feeling great. Will consult with Dr. Dulce Sellar.

## 2020-02-03 NOTE — Telephone Encounter (Signed)
Called respiratory clinic in [place] left patient information and contact information as the recording prompts you to do. They should call patient. Called patient informed him of this. Advised him to call us this afternoon if he hasn't heard from anyone. He verbally understood no further questions.

## 2020-02-03 NOTE — Telephone Encounter (Signed)
Patient is calling to get Dr. Hulen Shouts view on taking monoclonal antibodies. Please call back

## 2020-02-05 ENCOUNTER — Ambulatory Visit: Payer: BC Managed Care – PPO

## 2020-02-17 ENCOUNTER — Emergency Department (HOSPITAL_BASED_OUTPATIENT_CLINIC_OR_DEPARTMENT_OTHER)
Admission: EM | Admit: 2020-02-17 | Discharge: 2020-02-17 | Disposition: A | Discharge status: Home / Self Care | Payer: BC Managed Care – PPO | Attending: Emergency Medicine | Admitting: Emergency Medicine

## 2020-02-17 ENCOUNTER — Encounter (HOSPITAL_BASED_OUTPATIENT_CLINIC_OR_DEPARTMENT_OTHER): Payer: Self-pay

## 2020-02-17 ENCOUNTER — Telehealth: Payer: Self-pay

## 2020-02-17 ENCOUNTER — Emergency Department (HOSPITAL_BASED_OUTPATIENT_CLINIC_OR_DEPARTMENT_OTHER): Payer: BC Managed Care – PPO

## 2020-02-17 ENCOUNTER — Other Ambulatory Visit: Payer: Self-pay

## 2020-02-17 DIAGNOSIS — I10 Essential (primary) hypertension: Secondary | ICD-10-CM | POA: Diagnosis not present

## 2020-02-17 DIAGNOSIS — R079 Chest pain, unspecified: Secondary | ICD-10-CM | POA: Diagnosis not present

## 2020-02-17 DIAGNOSIS — R002 Palpitations: Secondary | ICD-10-CM | POA: Insufficient documentation

## 2020-02-17 DIAGNOSIS — I2510 Atherosclerotic heart disease of native coronary artery without angina pectoris: Secondary | ICD-10-CM | POA: Insufficient documentation

## 2020-02-17 DIAGNOSIS — Z79899 Other long term (current) drug therapy: Secondary | ICD-10-CM | POA: Diagnosis not present

## 2020-02-17 DIAGNOSIS — R0602 Shortness of breath: Secondary | ICD-10-CM | POA: Diagnosis not present

## 2020-02-17 DIAGNOSIS — E119 Type 2 diabetes mellitus without complications: Secondary | ICD-10-CM | POA: Insufficient documentation

## 2020-02-17 DIAGNOSIS — Z87891 Personal history of nicotine dependence: Secondary | ICD-10-CM | POA: Diagnosis not present

## 2020-02-17 DIAGNOSIS — I2584 Coronary atherosclerosis due to calcified coronary lesion: Secondary | ICD-10-CM | POA: Diagnosis not present

## 2020-02-17 DIAGNOSIS — Z951 Presence of aortocoronary bypass graft: Secondary | ICD-10-CM | POA: Insufficient documentation

## 2020-02-17 DIAGNOSIS — R5383 Other fatigue: Secondary | ICD-10-CM | POA: Insufficient documentation

## 2020-02-17 DIAGNOSIS — Z7982 Long term (current) use of aspirin: Secondary | ICD-10-CM | POA: Insufficient documentation

## 2020-02-17 DIAGNOSIS — Z8616 Personal history of COVID-19: Secondary | ICD-10-CM | POA: Diagnosis not present

## 2020-02-17 DIAGNOSIS — Z7984 Long term (current) use of oral hypoglycemic drugs: Secondary | ICD-10-CM | POA: Diagnosis not present

## 2020-02-17 DIAGNOSIS — N281 Cyst of kidney, acquired: Secondary | ICD-10-CM | POA: Diagnosis not present

## 2020-02-17 DIAGNOSIS — R000 Tachycardia, unspecified: Secondary | ICD-10-CM | POA: Diagnosis not present

## 2020-02-17 DIAGNOSIS — M542 Cervicalgia: Secondary | ICD-10-CM | POA: Diagnosis not present

## 2020-02-17 DIAGNOSIS — J9811 Atelectasis: Secondary | ICD-10-CM | POA: Diagnosis not present

## 2020-02-17 LAB — CBC WITH DIFFERENTIAL/PLATELET
Abs Immature Granulocytes: 0.02 10*3/uL (ref 0.00–0.07)
Basophils Absolute: 0.1 10*3/uL (ref 0.0–0.1)
Basophils Relative: 1 %
Eosinophils Absolute: 0.3 10*3/uL (ref 0.0–0.5)
Eosinophils Relative: 3 %
HCT: 47.4 % (ref 39.0–52.0)
Hemoglobin: 15.8 g/dL (ref 13.0–17.0)
Immature Granulocytes: 0 %
Lymphocytes Relative: 30 %
Lymphs Abs: 2.7 10*3/uL (ref 0.7–4.0)
MCH: 31 pg (ref 26.0–34.0)
MCHC: 33.3 g/dL (ref 30.0–36.0)
MCV: 92.9 fL (ref 80.0–100.0)
Monocytes Absolute: 0.7 10*3/uL (ref 0.1–1.0)
Monocytes Relative: 7 %
Neutro Abs: 5.3 10*3/uL (ref 1.7–7.7)
Neutrophils Relative %: 59 %
Platelets: 276 10*3/uL (ref 150–400)
RBC: 5.1 MIL/uL (ref 4.22–5.81)
RDW: 13.1 % (ref 11.5–15.5)
WBC: 9.1 10*3/uL (ref 4.0–10.5)
nRBC: 0 % (ref 0.0–0.2)

## 2020-02-17 LAB — BASIC METABOLIC PANEL
Anion gap: 9 (ref 5–15)
BUN: 14 mg/dL (ref 6–20)
CO2: 26 mmol/L (ref 22–32)
Calcium: 9.1 mg/dL (ref 8.9–10.3)
Chloride: 103 mmol/L (ref 98–111)
Creatinine, Ser: 0.92 mg/dL (ref 0.61–1.24)
GFR, Estimated: >60 mL/min (ref >60)
Glucose, Bld: 213 mg/dL — ABNORMAL HIGH (ref 70–99)
Potassium: 3.9 mmol/L (ref 3.5–5.1)
Sodium: 138 mmol/L (ref 135–145)

## 2020-02-17 MED ORDER — IOHEXOL 350 MG/ML SOLN
100.0000 mL | Freq: Once | INTRAVENOUS | Status: AC | PRN
  Administered 2020-02-17: 100 mL via INTRAVENOUS

## 2020-02-17 NOTE — Telephone Encounter (Signed)
Since having COVID in January, patient has experienced nightly heart discomfort which has caused significant loss of sleep. Patient reports that he woke up at 4AM this morning and his Apple Watch recorded Heart Rate of 141. Discomfort was enough for him to visit [facility] ER where they performed a Chest X-Ray and Lung CAT Scan. ER provider was unable to determine cause and recommended follow up with Dr. Dulce Sellar. Patient has history of sleep apnea and afibrillation. He reports that his nightly episodes feel similar to his afib episodes.

## 2020-02-17 NOTE — Discharge Instructions (Addendum)
Follow-up with your doctors.  The CAT scan of the chest had no acute findings.  No evidence of any blood clots or any evidence of pneumonia or any evidence of fluid around the heart.

## 2020-02-17 NOTE — Telephone Encounter (Signed)
How do you advise?

## 2020-02-17 NOTE — Telephone Encounter (Signed)
We should place a live ZIO monitor on the patient for 2 weeks and try to capture what is happening.  Follow-up afterwards.

## 2020-02-17 NOTE — ED Notes (Signed)
Pt resting on stretcher, just returned from CT.

## 2020-02-17 NOTE — ED Provider Notes (Signed)
[facility] EMERGENCY DEPARTMENT Provider Note   CSN: [id number] Arrival date & time: 02/17/20  0093     History Chief Complaint  Patient presents with  . Palpitations    Brandon [name] is a 55 y.o. male.  HPI     This 55 year old male with a history of ACS, DVT, paroxysmal atrial fibrillation, diabetes who presents with palpitations.  Patient reports that he was diagnosed with COVID-19 approximately 1 month ago.  He states he has had residual fatigue and intermittent palpitations.  He has been monitoring his heart rate with his watch.  He states that overall he felt like he was improving; however, this morning he was awoken with palpitations.  He noted his heart rate to be 140s.  He had associated shortness of breath which is new for him.  No cough or recent fevers.  He has not had any chest pain.  No other lower extremity swelling.  He is not currently on any anticoagulants.  He has not seen his cardiologist recently.  Patient self reports atrial fibrillation; however, do not see any documented history and the chart.  He did have a DVT in October 2021 for which she was on 90 days of anticoagulation.  No longer on anticoagulation.  Past Medical History:  Diagnosis Date  . ACS (acute coronary syndrome) (HCC) 04/2018  . Acute coronary syndrome (HCC) 04/23/2018  . Acute deep vein thrombosis (DVT) of tibial vein of right lower extremity (HCC) 10/21/2019   Formatting of this note might be different from the original. 09/24/2019: RIGHT LOWER EXTREMITY VENOUS DOPPLER ULTRASOUND Evaluation of the infrapopliteal venous vasculature demonstrates acute thrombosis of 1 of the paired posterior tibial veins of the right lower extremity. This does not extend into the popliteal vein itself  . Arthritis    both knees  . Arthritis of knee, degenerative 05/14/2015  . CAD in native artery 05/06/2018  . Chest pain 06/23/2018  . Chronic right shoulder pain 05/28/2015  . Complication of anesthesia     " i HAVE A HARD TIME WAKING UP "  . Coronary artery disease   . Essential hypertension 05/14/2015  . Facial twitching 09/10/2018  . Gross hematuria 07/16/2019   Formatting of this note might be different from the original. 2021  . Herpes zoster without complication 05/20/2019  . History of non-ST elevation myocardial infarction (NSTEMI) 07/30/2018  . Hypertension   . Laryngopharyngeal reflux (LPR) 05/06/2019  . Mixed hyperlipidemia 05/14/2015  . Non-ST elevation (NSTEMI) myocardial infarction (HCC)   . Pre-diabetes 03/30/2016   2017: x/6.7 2018: 103/6.4  . Right elbow pain 05/28/2015  . SOB (shortness of breath) on exertion 05/16/2018  . Type 2 diabetes mellitus without complication, without long-term current use of insulin (HCC) 03/30/2016   Formatting of this note might be different from the original. 2017: x/6.7 2018: 103/6.4 2021: 7.8    Patient Active Problem List   Diagnosis Date Noted  . Hypertension   . Coronary artery disease   . Complication of anesthesia   . Arthritis   . DVT (deep venous thrombosis) (HCC) 10/21/2019  . Gross hematuria 07/16/2019  . Herpes zoster without complication 05/20/2019  . Laryngopharyngeal reflux (LPR) 05/06/2019  . Facial twitching 09/10/2018  . History of non-ST elevation myocardial infarction (NSTEMI) 07/30/2018  . Chest pain 06/23/2018  . SOB (shortness of breath) on exertion 05/16/2018  . CAD in native artery 05/06/2018  . Acute coronary syndrome (HCC) 04/23/2018  . Non-ST elevation (NSTEMI) myocardial infarction (HCC)   .  ACS (acute coronary syndrome) (HCC) 04/2018  . Pre-diabetes 03/30/2016  . Type 2 diabetes mellitus without complication, without long-term current use of insulin (HCC) 03/30/2016  . Chronic right shoulder pain 05/28/2015  . Right elbow pain 05/28/2015  . Essential hypertension 05/14/2015  . Mixed hyperlipidemia 05/14/2015  . Arthritis of knee, degenerative 05/14/2015    Past Surgical History:  Procedure Laterality Date  .  CORONARY STENT INTERVENTION N/A 04/23/2018   Procedure: CORONARY STENT INTERVENTION;  Surgeon: Tonny Bollman, MD;  Location: [facility] INVASIVE CV LAB;  Service: Cardiovascular;  Laterality: N/A;  . EXCISION HAGLUND'S DEFORMITY WITH ACHILLES TENDON REPAIR Right 05/22/2014   Procedure: RIGHT ACHILLES DEBRIDEMENT; HAGLUND'S EXCISION ;  Surgeon: Toni Arthurs, MD;  Location: [facility];  Service: Orthopedics;  Laterality: Right;  . GASTROCNEMIUS RECESSION Right 05/22/2014   Procedure: GASTROC RECESSION ;  Surgeon: Toni Arthurs, MD;  Location: [facility];  Service: Orthopedics;  Laterality: Right;  . KNEE SURGERY     l/knee  . LAPAROSCOPIC GASTRIC SLEEVE RESECTION    . LEFT HEART CATH AND CORONARY ANGIOGRAPHY N/A 04/23/2018   Procedure: LEFT HEART CATH AND CORONARY ANGIOGRAPHY;  Surgeon: Tonny Bollman, MD;  Location: [facility] INVASIVE CV LAB;  Service: Cardiovascular;  Laterality: N/A;  . MOUTH SURGERY     Root canal       Family History  Problem Relation Age of Onset  . Cancer Mother   . Heart disease Father 53  . Hypertension Father     Social History   Tobacco Use  . Smoking status: Former Smoker    Packs/day: 2.00    Years: 10.00    Pack years: 20.00    Types: Cigarettes  . Smokeless tobacco: Never Used  . Tobacco comment: Quti smoking 20 years ago  Vaping Use  . Vaping Use: Never used  Substance Use Topics  . Alcohol use: Yes    Comment: occ  . Drug use: No    Home Medications Prior to Admission medications   Medication Sig Start Date End Date Taking? Authorizing Provider  aspirin 81 MG chewable tablet Chew 1 tablet (81 mg total) by mouth daily. 04/24/18   Barrett, Joline Salt, PA-C  ezetimibe (ZETIA) 10 MG tablet Take 1 tablet (10 mg total) by mouth daily. 03/05/19   Baldo Daub, MD  metFORMIN (GLUCOPHAGE) 500 MG tablet Take 500 mg by mouth 2 (two) times daily. 05/07/19   [provider]  metoprolol tartrate (LOPRESSOR) 25 MG tablet TAKE 1 TABLET  BY MOUTH TWICE A DAY 05/27/19   Baldo Daub, MD  Multiple Vitamin (MULTIVITAMIN WITH MINERALS) TABS tablet Take 1 tablet by mouth daily.    [provider]  nitroGLYCERIN (NITROSTAT) 0.4 MG SL tablet Place 1 tablet (0.4 mg total) under the tongue every 5 (five) minutes as needed for chest pain. 04/24/18   Barrett, Joline Salt, PA-C  rosuvastatin (CRESTOR) 40 MG tablet Take 40 mg by mouth daily. 08/19/19   [provider]  telmisartan-hydrochlorothiazide (MICARDIS HCT) 40-12.5 MG tablet TAKE 1 TABLET BY MOUTH EVERY DAY 08/30/19   Baldo Daub, MD  Vitamin D, Ergocalciferol, (DRISDOL) 1.25 MG (50000 UNIT) CAPS capsule Take 50,000 Units by mouth once a week. 04/21/19   [provider]    Allergies    Brilinta [ticagrelor]  Review of Systems   Review of Systems  Constitutional: Negative for fever.  Respiratory: Positive for shortness of breath.   Cardiovascular: Positive for palpitations. Negative for chest pain.  Gastrointestinal: Negative for abdominal pain, diarrhea, nausea and vomiting.  Genitourinary: Negative for dysuria.  All other systems reviewed and are negative.   Physical Exam Updated Vital Signs BP 138/88 (BP Location: Right Arm)   Pulse 87   Temp 98.8 F (37.1 C) (Oral)   Resp 15   Ht 1.88 m (6\' 2" )   Wt (!) 163.3 kg   SpO2 95%   BMI 46.22 kg/m   Physical Exam Vitals and nursing note reviewed.  Constitutional:      Appearance: He is well-developed and well-nourished.     Comments: Morbidly obese, no acute distress  HENT:     Head: Normocephalic and atraumatic.     Nose: Nose normal.     Mouth/Throat:     Mouth: Mucous membranes are moist.  Eyes:     Pupils: Pupils are equal, round, and reactive to light.  Cardiovascular:     Rate and Rhythm: Normal rate and regular rhythm.     Heart sounds: Normal heart sounds. No murmur heard.   Pulmonary:     Effort: Pulmonary effort is normal. No respiratory distress.     Breath sounds: Normal  breath sounds. No wheezing.  Abdominal:     General: Bowel sounds are normal.     Palpations: Abdomen is soft.     Tenderness: There is no abdominal tenderness. There is no rebound.  Musculoskeletal:        General: No edema.     Cervical back: Neck supple.     Right lower leg: No edema.     Left lower leg: No edema.  Lymphadenopathy:     Cervical: No cervical adenopathy.  Skin:    General: Skin is warm and dry.  Neurological:     Mental Status: He is alert and oriented to person, place, and time.  Psychiatric:        Mood and Affect: Mood and affect and mood normal.     ED Results / Procedures / Treatments   Labs (all labs ordered are listed, but only abnormal results are displayed) Labs Reviewed  CBC WITH DIFFERENTIAL/PLATELET  BASIC METABOLIC PANEL  D-DIMER, QUANTITATIVE (NOT AT [facility])    EKG EKG Interpretation  Date/Time:  Monday February 17 2020 06:16:30 EST Ventricular Rate:  84 PR Interval:    QRS Duration: 103 QT Interval:  346 QTC Calculation: 409 R Axis:   144 Text Interpretation: Sinus rhythm Probable left atrial enlargement Left posterior fascicular block Borderline low voltage, extremity leads No significant change since last tracing Confirmed by [id number] (Ross Marcus) on 02/17/2020 6:20:40 AM   Radiology No results found.  Procedures Procedures   Medications Ordered in ED Medications - No data to display  ED Course  I have reviewed the triage vital signs and the nursing notes.  Pertinent labs & imaging results that were available during my care of the patient were reviewed by me and considered in my medical decision making (see chart for details).    MDM Rules/Calculators/A&P                          Patient presents with palpitations.  Noted to have increased heart rate this morning on his watch.  Reports associated shortness of breath.  Recent history of DVT but no longer on anticoagulation.  Considerations include but not limited to,  arrhythmia, PE less likely ACS, ongoing sequelae of COVID-19.  EKG without evidence of ischemia or arrhythmia.  I feel patient  is too high risk to restratify with a D-dimer.  He will need a CT PE study.  Lab work obtained as well to ensure no electrolyte derangements.  Patient will be signed out to oncoming provider.  Cardiac monitoring in place.  Final Clinical Impression(s) / ED Diagnoses Final diagnoses:  Palpitations    Rx / DC Orders ED Discharge Orders    None       Breyanna Valera, Mayer Masker, MD 02/17/20 [phone]

## 2020-02-17 NOTE — ED Notes (Signed)
Per MD request, lab notified to cancel D-Dimer.

## 2020-02-17 NOTE — ED Triage Notes (Signed)
Pt states he had covid a month ago and hasn't fully recovered. States this am his heart rate was 140 per his watch. Reports posterior neck pain also.

## 2020-02-18 ENCOUNTER — Ambulatory Visit (INDEPENDENT_AMBULATORY_CARE_PROVIDER_SITE_OTHER): Payer: BC Managed Care – PPO

## 2020-02-18 DIAGNOSIS — R002 Palpitations: Secondary | ICD-10-CM | POA: Diagnosis not present

## 2020-02-18 NOTE — Telephone Encounter (Signed)
Spoke to the patient just now. He is going to stop by later today to get this monitor put on.

## 2020-02-18 NOTE — Addendum Note (Signed)
Addended by: Delorse Limber I on: 02/18/2020 08:31 AM   Modules accepted: Orders

## 2020-03-01 NOTE — Progress Notes (Signed)
Cardiology Office Note:    Date:  03/02/2020   ID:  Brandon [name], DOB 09-22-1965, MRN [id number]  PCP:  Olive Bass, MD  Cardiologist:  Norman Herrlich, MD    Referring MD: Olive Bass, MD    ASSESSMENT:    1. Palpitations   2. History of DVT (deep vein thrombosis)   3. CAD in native artery   4. Essential hypertension   5. Mixed hyperlipidemia    PLAN:    In order of problems listed above:  1. Not uncommon following COVID-19, he is wearing a ZIO monitor and will assess whether there is significant arrhythmia or just the usual autonomic dysfunction, and the COVID-19 infections. 2. Unfortunately his anticardiolipin antibody was never performed will be drawn today he is off his anticoagulant.  Unless abnormal I would not put him on chronic prophylaxis. 3. Stable CAD having no angina continue medical therapy including aspirin beta-blocker antihypertensives and combined high intensity statin and Zetia   Next appointment: 6 months unless there is abnormality of his lab work or monitor.   Medication Adjustments/Labs and Tests Ordered: Current medicines are reviewed at length with the patient today.  Concerns regarding medicines are outlined above.  Orders Placed This Encounter  Procedures  . Lupus anticoagulant panel   No orders of the defined types were placed in this encounter.   Chief Complaint  Patient presents with  . Follow-up    [facility]    History of Present Illness:    Brandon [name] is a 55 y.o. male with a hx of CAD with non-ST segment elevation MI PCI and stent right coronary artery 04/23/2018 morbid obesity with previous gastric bypass surgery type 2 diabetes hypertension hyper lipidemia and infrapopliteal deep vein thrombosis right lower extremity September 2021 last seen 10/29/2019. Compliance with diet, lifestyle and medications: Yes  He had COVID-19 infection a month ago considered mild mostly fatigue and sinus symptoms.  Afterwards he is aware  of his resting heart rate being 20 beats higher than usual activities greater than 100bpm episodes of his heart racing at night not severe sustained but his watch document her heart rate of 151 beats.  He is improved those episodes have stopped and he is wearing an event monitor and he had been seen in the emergency room med [facility]. Past Medical History:  Diagnosis Date  . ACS (acute coronary syndrome) (HCC) 04/2018  . Acute coronary syndrome (HCC) 04/23/2018  . Acute deep vein thrombosis (DVT) of tibial vein of right lower extremity (HCC) 10/21/2019   Formatting of this note might be different from the original. 09/24/2019: RIGHT LOWER EXTREMITY VENOUS DOPPLER ULTRASOUND Evaluation of the infrapopliteal venous vasculature demonstrates acute thrombosis of 1 of the paired posterior tibial veins of the right lower extremity. This does not extend into the popliteal vein itself  . Arthritis    both knees  . Arthritis of knee, degenerative 05/14/2015  . CAD in native artery 05/06/2018  . Chest pain 06/23/2018  . Chronic right shoulder pain 05/28/2015  . Complication of anesthesia    " i HAVE A HARD TIME WAKING UP "  . Coronary artery disease   . Essential hypertension 05/14/2015  . Facial twitching 09/10/2018  . Gross hematuria 07/16/2019   Formatting of this note might be different from the original. 2021  . Herpes zoster without complication 05/20/2019  . History of non-ST elevation myocardial infarction (NSTEMI) 07/30/2018  . Hypertension   . Laryngopharyngeal reflux (LPR) 05/06/2019  .  Mixed hyperlipidemia 05/14/2015  . Non-ST elevation (NSTEMI) myocardial infarction (HCC)   . Pre-diabetes 03/30/2016   2017: x/6.7 2018: 103/6.4  . Right elbow pain 05/28/2015  . SOB (shortness of breath) on exertion 05/16/2018  . Type 2 diabetes mellitus without complication, without long-term current use of insulin (HCC) 03/30/2016   Formatting of this note might be different from the original. 2017: x/6.7 2018:  103/6.4 2021: 7.8    Past Surgical History:  Procedure Laterality Date  . CORONARY STENT INTERVENTION N/A 04/23/2018   Procedure: CORONARY STENT INTERVENTION;  Surgeon: Tonny Bollman, MD;  Location: [facility] INVASIVE CV LAB;  Service: Cardiovascular;  Laterality: N/A;  . EXCISION HAGLUND'S DEFORMITY WITH ACHILLES TENDON REPAIR Right 05/22/2014   Procedure: RIGHT ACHILLES DEBRIDEMENT; HAGLUND'S EXCISION ;  Surgeon: Toni Arthurs, MD;  Location: [facility];  Service: Orthopedics;  Laterality: Right;  . GASTROCNEMIUS RECESSION Right 05/22/2014   Procedure: GASTROC RECESSION ;  Surgeon: Toni Arthurs, MD;  Location: [facility];  Service: Orthopedics;  Laterality: Right;  . KNEE SURGERY     l/knee  . LAPAROSCOPIC GASTRIC SLEEVE RESECTION    . LEFT HEART CATH AND CORONARY ANGIOGRAPHY N/A 04/23/2018   Procedure: LEFT HEART CATH AND CORONARY ANGIOGRAPHY;  Surgeon: Tonny Bollman, MD;  Location: [facility] INVASIVE CV LAB;  Service: Cardiovascular;  Laterality: N/A;  . MOUTH SURGERY     Root canal    Current Medications: Current Meds  Medication Sig  . aspirin 81 MG chewable tablet Chew 1 tablet (81 mg total) by mouth daily.  Marland Kitchen ezetimibe (ZETIA) 10 MG tablet Take 1 tablet (10 mg total) by mouth daily.  . metFORMIN (GLUCOPHAGE) 500 MG tablet Take 500 mg by mouth 2 (two) times daily.  . metoprolol tartrate (LOPRESSOR) 25 MG tablet TAKE 1 TABLET BY MOUTH TWICE A DAY  . Multiple Vitamin (MULTIVITAMIN WITH MINERALS) TABS tablet Take 1 tablet by mouth daily.  . nitroGLYCERIN (NITROSTAT) 0.4 MG SL tablet Place 1 tablet (0.4 mg total) under the tongue every 5 (five) minutes as needed for chest pain.  . rosuvastatin (CRESTOR) 40 MG tablet Take 40 mg by mouth daily.  Marland Kitchen telmisartan-hydrochlorothiazide (MICARDIS HCT) 40-12.5 MG tablet TAKE 1 TABLET BY MOUTH EVERY DAY  . Vitamin D, Ergocalciferol, (DRISDOL) 1.25 MG (50000 UNIT) CAPS capsule Take 50,000 Units by mouth once a week.      Allergies:   Brilinta [ticagrelor]   Social History   Socioeconomic History  . Marital status: Married    Spouse name: Not on file  . Number of children: Not on file  . Years of education: Not on file  . Highest education level: Not on file  Occupational History  . Not on file  Tobacco Use  . Smoking status: Former Smoker    Packs/day: 2.00    Years: 10.00    Pack years: 20.00    Types: Cigarettes  . Smokeless tobacco: Never Used  . Tobacco comment: Quti smoking 20 years ago  Vaping Use  . Vaping Use: Never used  Substance and Sexual Activity  . Alcohol use: Yes    Comment: occ  . Drug use: No  . Sexual activity: Yes  Other Topics Concern  . Not on file  Social History Narrative  . Not on file   Social Determinants of Health   Financial Resource Strain: Not on file  Food Insecurity: Not on file  Transportation Needs: Not on file  Physical Activity: Not on file  Stress: Not on  file  Social Connections: Not on file     Family History: The patient's family history includes Cancer in his mother; Heart disease (age of onset: 55) in his father; Hypertension in his father. ROS:   Please see the history of present illness.    All other systems reviewed and are negative.  EKGs/Labs/Other Studies Reviewed:    The following studies were reviewed today:  EKG:  EKG performed 02/17/2019 med [facility] ED showed sinus rhythm left atrial enlargement left posterior fascicular block. Recent Labs: 09/24/2019: ALT 59 02/17/2020: BUN 14; Creatinine, Ser 0.92; Hemoglobin 15.8; Platelets 276; Potassium 3.9; Sodium 138  Recent Lipid Panel    Component Value Date/Time   CHOL 121 06/17/2019 1515   TRIG 112 06/17/2019 1515   HDL 44 06/17/2019 1515   CHOLHDL 2.8 06/17/2019 1515   CHOLHDL 4.0 04/23/2018 1547   VLDL 35 04/23/2018 1547   LDLCALC 57 06/17/2019 1515    Physical Exam:    VS:  BP 120/84 (BP Location: Right Arm, Patient Position: Sitting, Cuff Size: Large)    Pulse 68   Ht 6\' 2"  (1.88 m)   Wt (!) 369 lb (167.4 kg)   SpO2 96%   BMI 47.38 kg/m     Wt Readings from Last 3 Encounters:  03/02/20 (!) 369 lb (167.4 kg)  02/17/20 (!) 360 lb (163.3 kg)  10/29/19 (!) 358 lb 0.6 oz (162.4 kg)     GEN:  Well nourished, well developed in no acute distress HEENT: Normal NECK: No JVD; No carotid bruits LYMPHATICS: No lymphadenopathy CARDIAC: RRR, no murmurs, rubs, gallops RESPIRATORY:  Clear to auscultation without rales, wheezing or rhonchi  ABDOMEN: Soft, non-tender, non-distended MUSCULOSKELETAL:  No edema; No deformity  SKIN: Warm and dry NEUROLOGIC:  Alert and oriented x 3 PSYCHIATRIC:  Normal affect    Signed, 10/31/19, MD  03/02/2020 10:51 AM    [facility]

## 2020-03-02 ENCOUNTER — Encounter: Payer: Self-pay | Admitting: Cardiology

## 2020-03-02 ENCOUNTER — Ambulatory Visit (INDEPENDENT_AMBULATORY_CARE_PROVIDER_SITE_OTHER): Payer: BC Managed Care – PPO | Admitting: Cardiology

## 2020-03-02 ENCOUNTER — Other Ambulatory Visit: Payer: Self-pay

## 2020-03-02 VITALS — BP 120/84 | HR 68 | Ht 74.0 in | Wt 369.0 lb

## 2020-03-02 DIAGNOSIS — I2510 Atherosclerotic heart disease of native coronary artery without angina pectoris: Secondary | ICD-10-CM | POA: Diagnosis not present

## 2020-03-02 DIAGNOSIS — E782 Mixed hyperlipidemia: Secondary | ICD-10-CM

## 2020-03-02 DIAGNOSIS — I10 Essential (primary) hypertension: Secondary | ICD-10-CM

## 2020-03-02 DIAGNOSIS — R002 Palpitations: Secondary | ICD-10-CM | POA: Diagnosis not present

## 2020-03-02 DIAGNOSIS — Z86718 Personal history of other venous thrombosis and embolism: Secondary | ICD-10-CM

## 2020-03-02 DIAGNOSIS — I82441 Acute embolism and thrombosis of right tibial vein: Secondary | ICD-10-CM | POA: Diagnosis not present

## 2020-03-02 NOTE — Patient Instructions (Signed)
Medication Instructions:  Your physician recommends that you continue on your current medications as directed. Please refer to the Current Medication list given to you today.  *If you need a refill on your cardiac medications before your next appointment, please call your pharmacy*   Lab Work: Your physician recommends that you return for lab work in: TODAY Anticoagulant confirmation If you have labs (blood work) drawn today and your tests are completely normal, you will receive your results only by: Marland Kitchen MyChart Message (if you have MyChart) OR . A paper copy in the mail If you have any lab test that is abnormal or we need to change your treatment, we will call you to review the results.   Testing/Procedures: None   Follow-Up: At [facility], you and your health needs are our priority.  As part of our continuing mission to provide you with exceptional heart care, we have created designated Provider Care Teams.  These Care Teams include your primary Cardiologist (physician) and Advanced Practice Providers (APPs -  Physician Assistants and Nurse Practitioners) who all work together to provide you with the care you need, when you need it.  We recommend signing up for the patient portal called "MyChart".  Sign up information is provided on this After Visit Summary.  MyChart is used to connect with patients for Virtual Visits (Telemedicine).  Patients are able to view lab/test results, encounter notes, upcoming appointments, etc.  Non-urgent messages can be sent to your provider as well.   To learn more about what you can do with MyChart, go to [email or web address].    Your next appointment:   6 month(s)  The format for your next appointment:   In Person  Provider:   Norman Herrlich, MD   Other Instructions

## 2020-03-03 ENCOUNTER — Telehealth: Payer: Self-pay

## 2020-03-03 LAB — LUPUS ANTICOAGULANT PANEL
Dilute Viper Venom Time: 27.2 s (ref 0.0–47.0)
PTT Lupus Anticoagulant: 35.4 s (ref 0.0–51.9)

## 2020-03-03 NOTE — Telephone Encounter (Signed)
-----   Message from Baldo Daub, MD sent at 03/03/2020 12:08 PM EST ----- Normal result no change in treatment

## 2020-03-03 NOTE — Telephone Encounter (Signed)
Spoke with patient regarding results and recommendation.  Patient verbalizes understanding and is agreeable to plan of care. Advised patient to call back with any issues or concerns.  

## 2020-03-09 ENCOUNTER — Telehealth: Payer: Self-pay | Admitting: *Deleted

## 2020-03-09 NOTE — Telephone Encounter (Signed)
Left voicemail on pt's mobile inquiring about zio monitor that was placed on 02/18/20. Need to know if this has been mailed back yet.

## 2020-03-10 ENCOUNTER — Other Ambulatory Visit: Payer: Self-pay | Admitting: Cardiology

## 2020-03-16 ENCOUNTER — Telehealth: Payer: Self-pay

## 2020-03-16 NOTE — Telephone Encounter (Signed)
-----   Message from Baldo Daub, MD sent at 03/14/2020  2:49 PM EST ----- Good result symptoms are predominantly his own heart rhythm not uncommon after Covid no concerning irregularity of heartbeat is seen.

## 2020-03-16 NOTE — Telephone Encounter (Signed)
Left message on patients voicemail to please return our call.   

## 2020-03-16 NOTE — Telephone Encounter (Signed)
Spoke with patient regarding results and recommendation.  Patient verbalizes understanding and is agreeable to plan of care. Advised patient to call back with any issues or concerns.  

## 2020-03-16 NOTE — Telephone Encounter (Signed)
Patient returning call.

## 2020-04-29 ENCOUNTER — Telehealth: Payer: Self-pay

## 2020-04-30 NOTE — Telephone Encounter (Signed)
Left a message with Seward Grater to fax request to the [facility] location as requested

## 2020-05-27 ENCOUNTER — Other Ambulatory Visit: Payer: Self-pay | Admitting: Cardiology

## 2020-05-28 DIAGNOSIS — Z6841 Body Mass Index (BMI) 40.0 and over, adult: Secondary | ICD-10-CM | POA: Diagnosis not present

## 2020-05-28 DIAGNOSIS — Z7689 Persons encountering health services in other specified circumstances: Secondary | ICD-10-CM | POA: Diagnosis not present

## 2020-05-28 DIAGNOSIS — E1165 Type 2 diabetes mellitus with hyperglycemia: Secondary | ICD-10-CM | POA: Diagnosis not present

## 2020-05-28 DIAGNOSIS — M542 Cervicalgia: Secondary | ICD-10-CM | POA: Diagnosis not present

## 2020-06-03 ENCOUNTER — Other Ambulatory Visit: Payer: Self-pay | Admitting: Cardiology

## 2020-06-05 DIAGNOSIS — I6523 Occlusion and stenosis of bilateral carotid arteries: Secondary | ICD-10-CM | POA: Diagnosis not present

## 2020-06-05 DIAGNOSIS — R0989 Other specified symptoms and signs involving the circulatory and respiratory systems: Secondary | ICD-10-CM | POA: Diagnosis not present

## 2020-06-11 DIAGNOSIS — E785 Hyperlipidemia, unspecified: Secondary | ICD-10-CM | POA: Diagnosis not present

## 2020-06-11 DIAGNOSIS — E1165 Type 2 diabetes mellitus with hyperglycemia: Secondary | ICD-10-CM | POA: Diagnosis not present

## 2020-06-18 DIAGNOSIS — E785 Hyperlipidemia, unspecified: Secondary | ICD-10-CM | POA: Diagnosis not present

## 2020-06-18 DIAGNOSIS — E1165 Type 2 diabetes mellitus with hyperglycemia: Secondary | ICD-10-CM | POA: Diagnosis not present

## 2020-06-18 DIAGNOSIS — Z6841 Body Mass Index (BMI) 40.0 and over, adult: Secondary | ICD-10-CM | POA: Diagnosis not present

## 2020-06-18 DIAGNOSIS — I10 Essential (primary) hypertension: Secondary | ICD-10-CM | POA: Diagnosis not present

## 2020-07-14 DIAGNOSIS — M542 Cervicalgia: Secondary | ICD-10-CM | POA: Diagnosis not present

## 2020-07-14 DIAGNOSIS — Z6841 Body Mass Index (BMI) 40.0 and over, adult: Secondary | ICD-10-CM | POA: Diagnosis not present

## 2020-08-04 DIAGNOSIS — J0100 Acute maxillary sinusitis, unspecified: Secondary | ICD-10-CM | POA: Diagnosis not present

## 2020-08-04 DIAGNOSIS — Z20828 Contact with and (suspected) exposure to other viral communicable diseases: Secondary | ICD-10-CM | POA: Diagnosis not present

## 2020-08-04 DIAGNOSIS — R051 Acute cough: Secondary | ICD-10-CM | POA: Diagnosis not present

## 2020-08-11 DIAGNOSIS — J209 Acute bronchitis, unspecified: Secondary | ICD-10-CM | POA: Diagnosis not present

## 2020-08-11 DIAGNOSIS — J0100 Acute maxillary sinusitis, unspecified: Secondary | ICD-10-CM | POA: Diagnosis not present

## 2020-08-20 DIAGNOSIS — Z1152 Encounter for screening for COVID-19: Secondary | ICD-10-CM | POA: Diagnosis not present

## 2020-08-20 DIAGNOSIS — J029 Acute pharyngitis, unspecified: Secondary | ICD-10-CM | POA: Diagnosis not present

## 2020-08-20 DIAGNOSIS — R059 Cough, unspecified: Secondary | ICD-10-CM | POA: Diagnosis not present

## 2020-08-20 DIAGNOSIS — Z20822 Contact with and (suspected) exposure to covid-19: Secondary | ICD-10-CM | POA: Diagnosis not present

## 2020-08-20 DIAGNOSIS — J069 Acute upper respiratory infection, unspecified: Secondary | ICD-10-CM | POA: Diagnosis not present

## 2020-08-21 DIAGNOSIS — R059 Cough, unspecified: Secondary | ICD-10-CM | POA: Diagnosis not present

## 2020-08-25 DIAGNOSIS — R059 Cough, unspecified: Secondary | ICD-10-CM | POA: Diagnosis not present

## 2020-08-30 NOTE — Progress Notes (Signed)
Cardiology Office Note:    Date:  08/31/2020   ID:  Brandon [name], DOB 1965-07-12, MRN [id number]  PCP:  Lezlie Lye, Meda Coffee, MD  Cardiologist:  Norman Herrlich, MD    Referring MD: Olive Bass, MD    ASSESSMENT:    1. SOB (shortness of breath) on exertion   2. CAD in native artery   3. Essential hypertension   4. Mixed hyperlipidemia   5. History of DVT (deep vein thrombosis)    PLAN:    In order of problems listed above:  In association with CAD may be anginal equivalent I think it be wise to have further evaluation and will do a stress myocardial perfusion study at [facility] as I think the [facility] is better suited for his body habitus.  If ischemia is documented I favor recurrent coronary angiography.  In the interim we will continue medical therapy including aspirin beta-blocker lipid-lowering with a high intensity statin and Zetia and current antihypertensives. BP at target continue current treatment check renal function potassium Continue combined high intensity statin and Zetia check liver function lipid profile previous LDL is at target Hypercoagulable evaluation was normal currently not anticoagulated   Next appointment: 6 months   Medication Adjustments/Labs and Tests Ordered: Current medicines are reviewed at length with the patient today.  Concerns regarding medicines are outlined above.  No orders of the defined types were placed in this encounter.  No orders of the defined types were placed in this encounter.   Chief complaint follow-up for CAD He is concerned because of exertional shortness of breath   History of Present Illness:    Brandon [name] is a 55 y.o. male with a hx of CAD with non-ST segment elevation cardial infarction PCI and stent to right coronary artery and M1 with 2 drug-eluting stents 04/23/2018, morbid obesity previous gastric bypass surgery, type 2 diabetes, hypertension hyperal lipidemia and infrapopliteal deep vein thrombosis  right lower extremity in September 2021 last seen 03/03/2019 with complaints of palpitation following COVID-19 infection.  Review of the coronary angiogram reports from 04/23/18 20 shows mild nonobstructive disease in the LAD residual.  EF at that time normal subsequent myocardial perfusion study June 2022 EF 56%.  Compliance with diet, lifestyle and medications: Yes  He has a continued pattern of exertional shortness of breath it occurs with activities like singing at church and walking into stores he finds himself diaphoretic its not limiting no edema orthopnea chest pain palpitation or syncope.  He wonders whether he has recurrent cardiac ischemia.  He also has had some black spots on his scrotum and encouraged him to see a neurologist.  He tolerates his statin without muscle pain or weakness  Most recent labs 10/17/2019 A1c 6.6% total cholesterol 102 LDL 48 triglycerides 137 HDL 49 HDL cholesterol 62. Past Medical History:  Diagnosis Date   ACS (acute coronary syndrome) (HCC) 04/2018   Acute coronary syndrome (HCC) 04/23/2018   Acute deep vein thrombosis (DVT) of tibial vein of right lower extremity (HCC) 10/21/2019   Formatting of this note might be different from the original. 09/24/2019: RIGHT LOWER EXTREMITY VENOUS DOPPLER ULTRASOUND Evaluation of the infrapopliteal venous vasculature demonstrates acute thrombosis of 1 of the paired posterior tibial veins of the right lower extremity. This does not extend into the popliteal vein itself   Arthritis    both knees   Arthritis of knee, degenerative 05/14/2015   CAD in native artery 05/06/2018   Chest pain 06/23/2018   Chronic right  shoulder pain 05/28/2015   Complication of anesthesia    " i HAVE A HARD TIME WAKING UP "   Coronary artery disease    Essential hypertension 05/14/2015   Facial twitching 09/10/2018   Gross hematuria 07/16/2019   Formatting of this note might be different from the original. 2021   Herpes zoster without complication  05/20/2019   History of non-ST elevation myocardial infarction (NSTEMI) 07/30/2018   Hypertension    Laryngopharyngeal reflux (LPR) 05/06/2019   Mixed hyperlipidemia 05/14/2015   Non-ST elevation (NSTEMI) myocardial infarction [facility])    Pre-diabetes 03/30/2016   2017: x/6.7 2018: 103/6.4   Right elbow pain 05/28/2015   SOB (shortness of breath) on exertion 05/16/2018   Type 2 diabetes mellitus without complication, without long-term current use of insulin (HCC) 03/30/2016   Formatting of this note might be different from the original. 2017: x/6.7 2018: 103/6.4 2021: 7.8    Past Surgical History:  Procedure Laterality Date   CORONARY STENT INTERVENTION N/A 04/23/2018   Procedure: CORONARY STENT INTERVENTION;  Surgeon: Tonny Bollman, MD;  Location: [facility] INVASIVE CV LAB;  Service: Cardiovascular;  Laterality: N/A;   EXCISION HAGLUND'S DEFORMITY WITH ACHILLES TENDON REPAIR Right 05/22/2014   Procedure: RIGHT ACHILLES DEBRIDEMENT; HAGLUND'S EXCISION ;  Surgeon: Toni Arthurs, MD;  Location: [facility];  Service: Orthopedics;  Laterality: Right;   GASTROCNEMIUS RECESSION Right 05/22/2014   Procedure: GASTROC RECESSION ;  Surgeon: Toni Arthurs, MD;  Location: [facility];  Service: Orthopedics;  Laterality: Right;   KNEE SURGERY     l/knee   LAPAROSCOPIC GASTRIC SLEEVE RESECTION     LEFT HEART CATH AND CORONARY ANGIOGRAPHY N/A 04/23/2018   Procedure: LEFT HEART CATH AND CORONARY ANGIOGRAPHY;  Surgeon: Tonny Bollman, MD;  Location: [facility] INVASIVE CV LAB;  Service: Cardiovascular;  Laterality: N/A;   MOUTH SURGERY     Root canal    Current Medications: Current Meds  Medication Sig   aspirin 81 MG chewable tablet Chew 1 tablet (81 mg total) by mouth daily.   ezetimibe (ZETIA) 10 MG tablet TAKE 1 TABLET BY MOUTH EVERY DAY   metFORMIN (GLUCOPHAGE) 500 MG tablet Take 500 mg by mouth 2 (two) times daily.   metoprolol tartrate (LOPRESSOR) 25 MG tablet TAKE 1 TABLET BY MOUTH TWICE A  DAY   Multiple Vitamin (MULTIVITAMIN WITH MINERALS) TABS tablet Take 1 tablet by mouth daily.   nitroGLYCERIN (NITROSTAT) 0.4 MG SL tablet Place 1 tablet (0.4 mg total) under the tongue every 5 (five) minutes as needed for chest pain.   rosuvastatin (CRESTOR) 40 MG tablet Take 40 mg by mouth daily.   telmisartan-hydrochlorothiazide (MICARDIS HCT) 40-12.5 MG tablet TAKE 1 TABLET BY MOUTH EVERY DAY   Vitamin D, Ergocalciferol, (DRISDOL) 1.25 MG (50000 UNIT) CAPS capsule Take 50,000 Units by mouth once a week.     Allergies:   Brilinta [ticagrelor]   Social History   Socioeconomic History   Marital status: Married    Spouse name: Not on file   Number of children: Not on file   Years of education: Not on file   Highest education level: Not on file  Occupational History   Not on file  Tobacco Use   Smoking status: Former    Packs/day: 2.00    Years: 10.00    Pack years: 20.00    Types: Cigarettes   Smokeless tobacco: Never   Tobacco comments:    Quti smoking 20 years ago  Vaping Use   Vaping  Use: Never used  Substance and Sexual Activity   Alcohol use: Yes    Comment: occ   Drug use: No   Sexual activity: Yes  Other Topics Concern   Not on file  Social History Narrative   Not on file   Social Determinants of Health   Financial Resource Strain: Not on file  Food Insecurity: Not on file  Transportation Needs: Not on file  Physical Activity: Not on file  Stress: Not on file  Social Connections: Not on file     Family History: The patient's family history includes Cancer in his mother; Heart disease (age of onset: 25) in his father; Hypertension in his father. ROS:   Please see the history of present illness.    All other systems reviewed and are negative.  EKGs/Labs/Other Studies Reviewed:    The following studies were reviewed today:  EKG:  EKG ordered today and personally reviewed.  The ekg ordered today demonstrates sinus rhythm right axis deviation otherwise  normal  Recent Labs: 09/24/2019: ALT 59 02/17/2020: BUN 14; Creatinine, Ser 0.92; Hemoglobin 15.8; Platelets 276; Potassium 3.9; Sodium 138  Recent Lipid Panel    Component Value Date/Time   CHOL 121 06/17/2019 1515   TRIG 112 06/17/2019 1515   HDL 44 06/17/2019 1515   CHOLHDL 2.8 06/17/2019 1515   CHOLHDL 4.0 04/23/2018 1547   VLDL 35 04/23/2018 1547   LDLCALC 57 06/17/2019 1515    Physical Exam:    VS:  BP 128/74 (BP Location: Right Arm, Patient Position: Sitting, Cuff Size: Large)   Pulse 64   Ht 6\' 2"  (1.88 m)   Wt (!) 367 lb 6.4 oz (166.7 kg)   SpO2 98%   BMI 47.17 kg/m     Wt Readings from Last 3 Encounters:  08/31/20 (!) 367 lb 6.4 oz (166.7 kg)  03/02/20 (!) 369 lb (167.4 kg)  02/17/20 (!) 360 lb (163.3 kg)     GEN: Morbid obesity BMI 47 well nourished, well developed in no acute distress HEENT: Normal NECK: No JVD; No carotid bruits LYMPHATICS: No lymphadenopathy CARDIAC: RRR, no murmurs, rubs, gallops RESPIRATORY:  Clear to auscultation without rales, wheezing or rhonchi  ABDOMEN: Soft, non-tender, non-distended MUSCULOSKELETAL:  No edema; No deformity  SKIN: Warm and dry NEUROLOGIC:  Alert and oriented x 3 PSYCHIATRIC:  Normal affect    Signed, 04/16/20, MD  08/31/2020 8:40 AM    [facility]

## 2020-08-31 ENCOUNTER — Other Ambulatory Visit: Payer: Self-pay

## 2020-08-31 ENCOUNTER — Ambulatory Visit (INDEPENDENT_AMBULATORY_CARE_PROVIDER_SITE_OTHER): Payer: BC Managed Care – PPO | Admitting: Cardiology

## 2020-08-31 ENCOUNTER — Encounter: Payer: Self-pay | Admitting: Cardiology

## 2020-08-31 VITALS — BP 128/74 | HR 64 | Ht 74.0 in | Wt 367.4 lb

## 2020-08-31 DIAGNOSIS — E782 Mixed hyperlipidemia: Secondary | ICD-10-CM | POA: Diagnosis not present

## 2020-08-31 DIAGNOSIS — R0602 Shortness of breath: Secondary | ICD-10-CM | POA: Diagnosis not present

## 2020-08-31 DIAGNOSIS — Z86718 Personal history of other venous thrombosis and embolism: Secondary | ICD-10-CM

## 2020-08-31 DIAGNOSIS — I2510 Atherosclerotic heart disease of native coronary artery without angina pectoris: Secondary | ICD-10-CM | POA: Diagnosis not present

## 2020-08-31 DIAGNOSIS — I10 Essential (primary) hypertension: Secondary | ICD-10-CM

## 2020-08-31 DIAGNOSIS — R079 Chest pain, unspecified: Secondary | ICD-10-CM

## 2020-08-31 NOTE — Patient Instructions (Signed)
Medication Instructions:  Your physician recommends that you continue on your current medications as directed. Please refer to the Current Medication list given to you today.  *If you need a refill on your cardiac medications before your next appointment, please call your pharmacy*   Lab Work: Your physician recommends that you return for lab work in:  TODAY: Lipids, CMP, ProBNP If you have labs (blood work) drawn today and your tests are completely normal, you will receive your results only by: MyChart Message (if you have MyChart) OR A paper copy in the mail If you have any lab test that is abnormal or we need to change your treatment, we will call you to review the results.   Testing/Procedures: Your physician has requested that you have en exercise stress myoview. For further information please visit [email or web address] Please follow instruction sheet, as given.   The test will take approximately 3 to 4 hours to complete; you may bring reading material.  If someone comes with you to your appointment, they will need to remain in the main lobby due to limited space in the testing area. **If you are pregnant or breastfeeding, please notify the nuclear lab prior to your appointment**  How to prepare for your Myocardial Perfusion Test: Do not eat or drink 3 hours prior to your test, except you may have water. Do not consume products containing caffeine (regular or decaffeinated) 12 hours prior to your test. (ex: coffee, chocolate, sodas, tea). Do bring a list of your current medications with you.  If not listed below, you may take your medications as normal. Do wear comfortable clothes (no dresses or overalls) and walking shoes, tennis shoes preferred (No heels or open toe shoes are allowed). Do NOT wear cologne, perfume, aftershave, or lotions (deodorant is allowed). If these instructions are not followed, your test will have to be rescheduled.    Follow-Up: At [facility], you  and your health needs are our priority.  As part of our continuing mission to provide you with exceptional heart care, we have created designated Provider Care Teams.  These Care Teams include your primary Cardiologist (physician) and Advanced Practice Providers (APPs -  Physician Assistants and Nurse Practitioners) who all work together to provide you with the care you need, when you need it.  We recommend signing up for the patient portal called "MyChart".  Sign up information is provided on this After Visit Summary.  MyChart is used to connect with patients for Virtual Visits (Telemedicine).  Patients are able to view lab/test results, encounter notes, upcoming appointments, etc.  Non-urgent messages can be sent to your provider as well.   To learn more about what you can do with MyChart, go to [email or web address].    Your next appointment:   6 month(s)  The format for your next appointment:   In Person  Provider:   Norman Herrlich, MD   Other Instructions

## 2020-09-01 ENCOUNTER — Telehealth (HOSPITAL_COMMUNITY): Payer: Self-pay | Admitting: *Deleted

## 2020-09-01 LAB — COMPREHENSIVE METABOLIC PANEL
ALT: 50 IU/L — ABNORMAL HIGH (ref 0–44)
AST: 43 IU/L — ABNORMAL HIGH (ref 0–40)
Albumin/Globulin Ratio: 2 (ref 1.2–2.2)
Albumin: 4.6 g/dL (ref 3.8–4.9)
Alkaline Phosphatase: 52 IU/L (ref 44–121)
BUN/Creatinine Ratio: 16 (ref 9–20)
BUN: 14 mg/dL (ref 6–24)
Bilirubin Total: 0.8 mg/dL (ref 0.0–1.2)
CO2: 25 mmol/L (ref 20–29)
Calcium: 9.8 mg/dL (ref 8.7–10.2)
Chloride: 99 mmol/L (ref 96–106)
Creatinine, Ser: 0.85 mg/dL (ref 0.76–1.27)
Globulin, Total: 2.3 g/dL (ref 1.5–4.5)
Glucose: 186 mg/dL — ABNORMAL HIGH (ref 65–99)
Potassium: 5 mmol/L (ref 3.5–5.2)
Sodium: 138 mmol/L (ref 134–144)
Total Protein: 6.9 g/dL (ref 6.0–8.5)
eGFR: 103 mL/min/{1.73_m2} (ref >59)

## 2020-09-01 LAB — LIPID PANEL
Chol/HDL Ratio: 3.2 ratio (ref 0.0–5.0)
Cholesterol, Total: 140 mg/dL (ref 100–199)
HDL: 44 mg/dL (ref >39)
LDL Chol Calc (NIH): 68 mg/dL (ref 0–99)
Triglycerides: 164 mg/dL — ABNORMAL HIGH (ref 0–149)
VLDL Cholesterol Cal: 28 mg/dL (ref 5–40)

## 2020-09-01 LAB — PRO B NATRIURETIC PEPTIDE: NT-Pro BNP: 26 pg/mL (ref 0–210)

## 2020-09-01 NOTE — Telephone Encounter (Signed)
Patient given detailed instructions per Myocardial Perfusion Study Information Sheet for the test on 09/07/20 at 1:00. Patient notified to arrive 15 minutes early and that it is imperative to arrive on time for appointment to keep from having the test rescheduled.  If you need to cancel or reschedule your appointment, please call the office within 24 hours of your appointment. . Patient verbalized understanding.[name]

## 2020-09-07 ENCOUNTER — Ambulatory Visit (HOSPITAL_COMMUNITY): Payer: BC Managed Care – PPO | Attending: Internal Medicine

## 2020-09-07 ENCOUNTER — Other Ambulatory Visit: Payer: Self-pay

## 2020-09-07 DIAGNOSIS — R079 Chest pain, unspecified: Secondary | ICD-10-CM | POA: Insufficient documentation

## 2020-09-07 MED ORDER — TECHNETIUM TC 99M TETROFOSMIN IV KIT
32.5000 | PACK | Freq: Once | INTRAVENOUS | Status: AC | PRN
  Administered 2020-09-07: 32.5 via INTRAVENOUS
  Filled 2020-09-07: qty 33

## 2020-09-08 ENCOUNTER — Ambulatory Visit (HOSPITAL_COMMUNITY): Payer: BC Managed Care – PPO | Attending: Internal Medicine

## 2020-09-08 LAB — MYOCARDIAL PERFUSION IMAGING
Angina Index: 0
Base ST Depression (mm): 0 mm
Duke Treadmill Score: 5
Estimated workload: 7
Exercise duration (min): 5 min
Exercise duration (sec): 15 s
LV dias vol: 84 mL (ref 62–150)
LV sys vol: 35 mL
MPHR: 165 {beats}/min
Nuc Stress EF: 59 %
Peak HR: 141 {beats}/min
Percent HR: 85 %
RPE: 19
Rest HR: 73 {beats}/min
Rest Nuclear Isotope Dose: 32.4 mCi
SRS: 2
SSS: 2
ST Depression (mm): 0 mm
Stress Nuclear Isotope Dose: 32.5 mCi
TID: 0.78

## 2020-09-08 MED ORDER — TECHNETIUM TC 99M TETROFOSMIN IV KIT
32.4000 | PACK | Freq: Once | INTRAVENOUS | Status: AC | PRN
  Administered 2020-09-08: 32.4 via INTRAVENOUS
  Filled 2020-09-08: qty 33

## 2020-09-28 DIAGNOSIS — E1165 Type 2 diabetes mellitus with hyperglycemia: Secondary | ICD-10-CM | POA: Diagnosis not present

## 2020-09-28 DIAGNOSIS — I10 Essential (primary) hypertension: Secondary | ICD-10-CM | POA: Diagnosis not present

## 2020-09-28 DIAGNOSIS — E785 Hyperlipidemia, unspecified: Secondary | ICD-10-CM | POA: Diagnosis not present

## 2020-09-28 DIAGNOSIS — Z125 Encounter for screening for malignant neoplasm of prostate: Secondary | ICD-10-CM | POA: Diagnosis not present

## 2020-10-13 DIAGNOSIS — I10 Essential (primary) hypertension: Secondary | ICD-10-CM | POA: Diagnosis not present

## 2020-10-13 DIAGNOSIS — Z6841 Body Mass Index (BMI) 40.0 and over, adult: Secondary | ICD-10-CM | POA: Diagnosis not present

## 2020-10-13 DIAGNOSIS — E785 Hyperlipidemia, unspecified: Secondary | ICD-10-CM | POA: Diagnosis not present

## 2020-10-13 DIAGNOSIS — E1165 Type 2 diabetes mellitus with hyperglycemia: Secondary | ICD-10-CM | POA: Diagnosis not present

## 2020-10-18 IMAGING — US US EXTREM LOW VENOUS*R*
1 series · 13 of 24 positions shown · non-contrast
Comparison: None.

CLINICAL DATA: Right leg swelling

EXAM:
RIGHT LOWER EXTREMITY VENOUS DOPPLER ULTRASOUND
TECHNIQUE: Gray-scale sonography with compression, as well as color and duplex
ultrasound, were performed to evaluate the deep venous system(s)
from the level of the common femoral vein through the popliteal and
proximal calf veins.

[Series 1: us extrem low venous*right* · 13 of 36 slices shown]
[im 1/36]
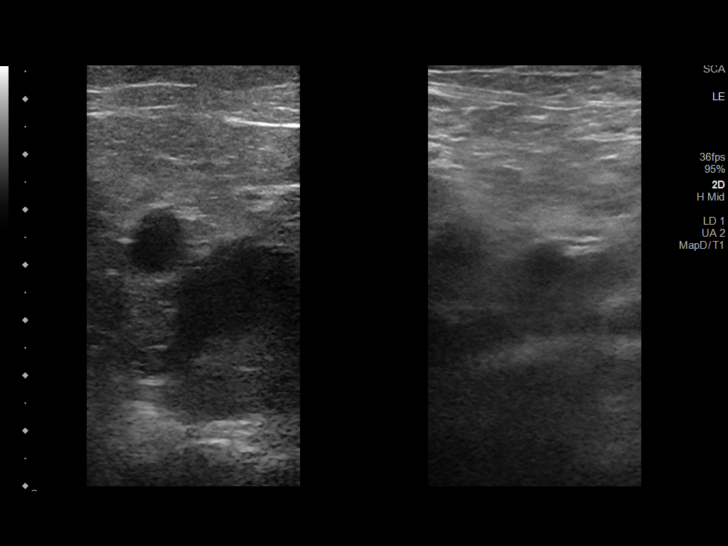
[im 4/36]
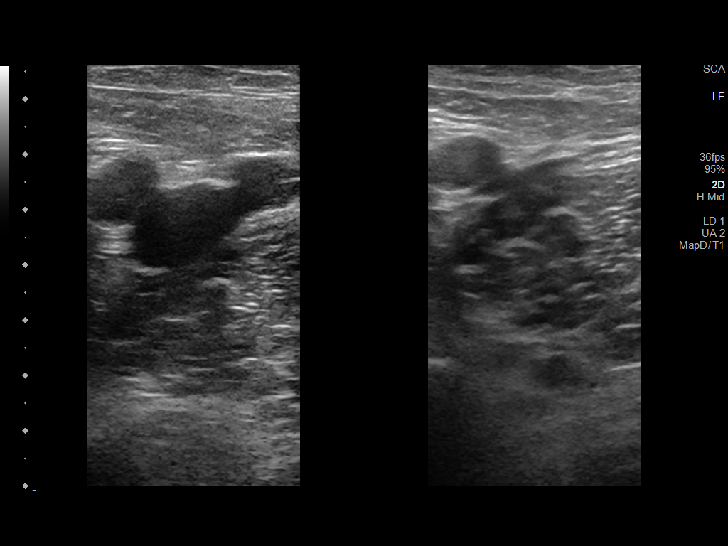
[im 7/36]
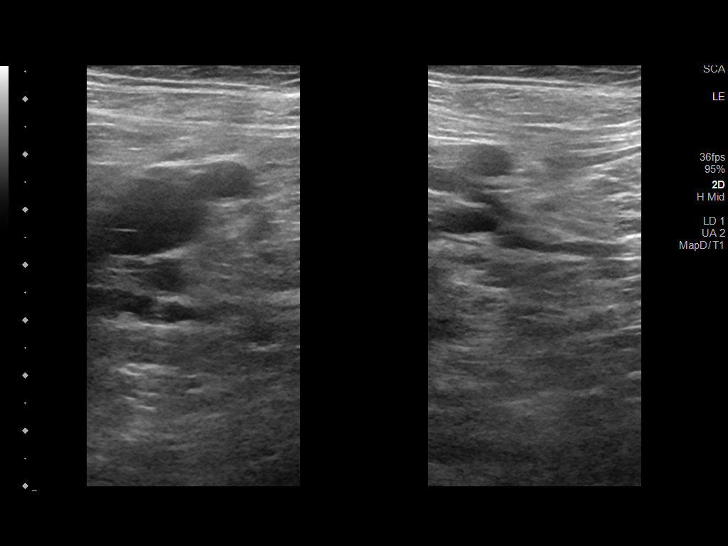
[im 10/36]
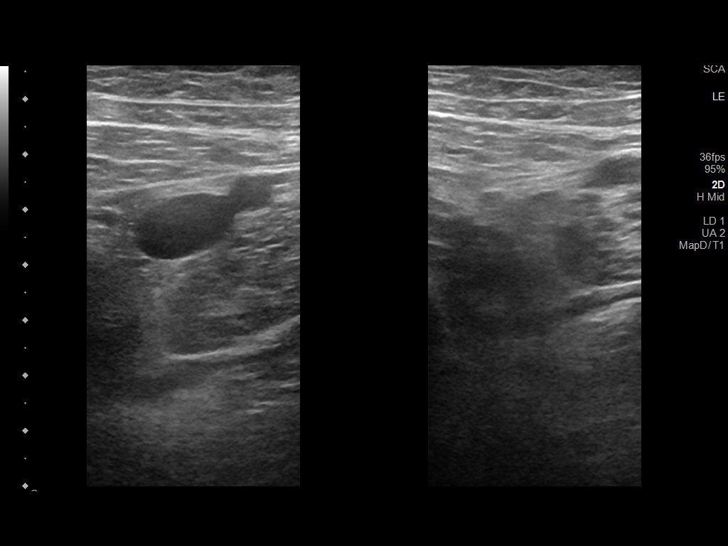
[im 13/36]
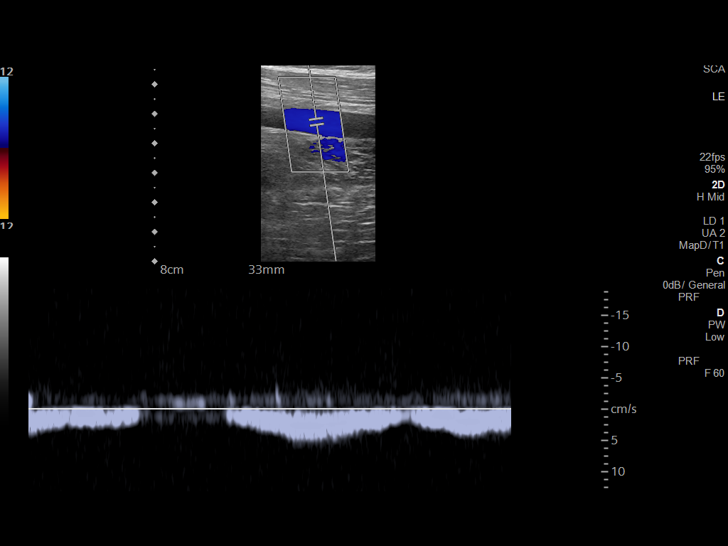
[im 16/36]
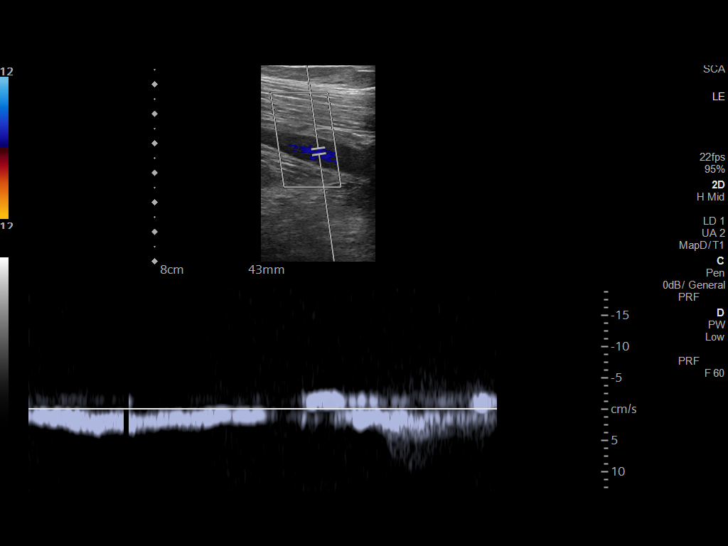
[im 19/36]
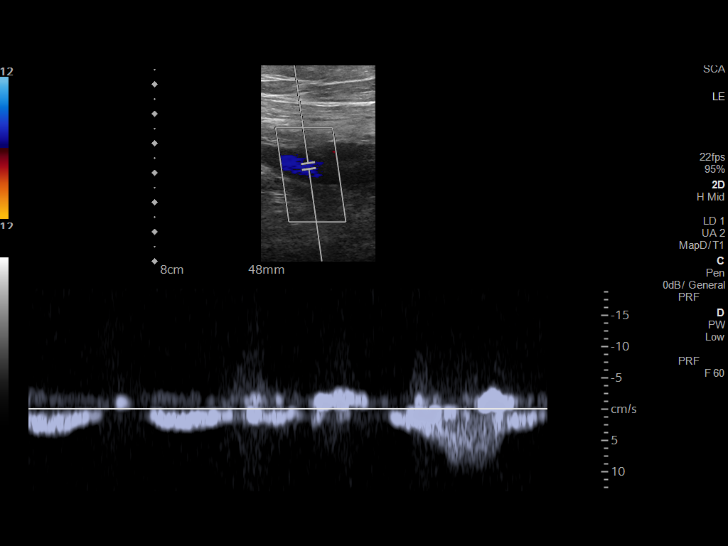
[im 20/36]
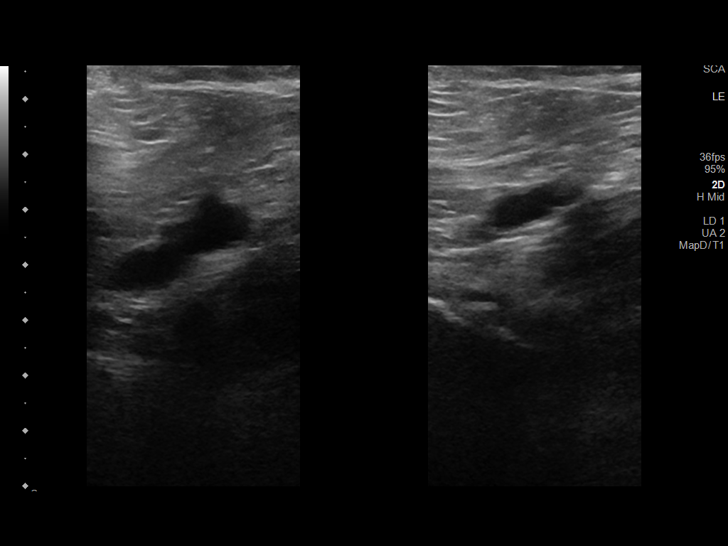
[im 23/36]
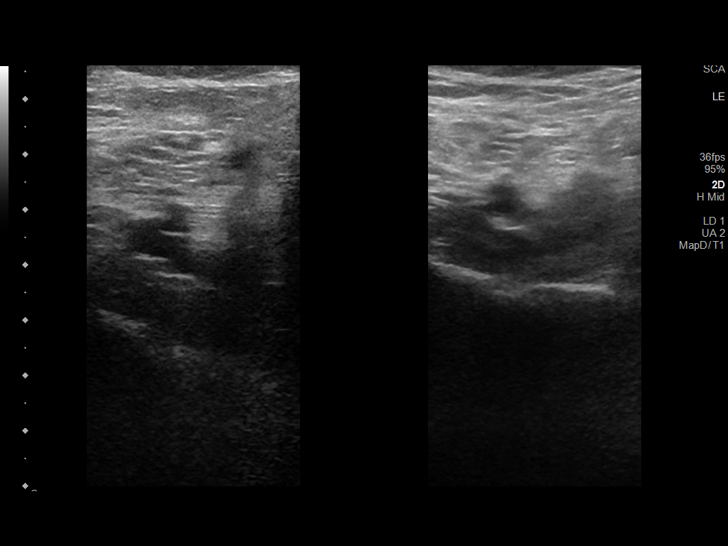
[im 26/36]
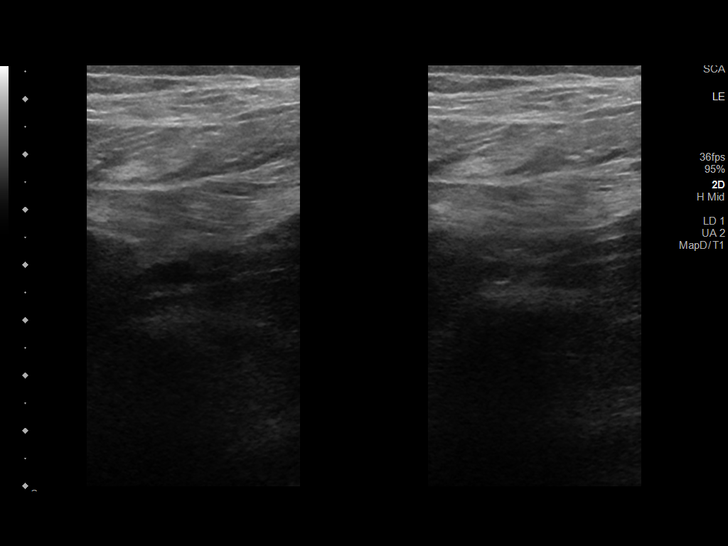
[im 29/36]
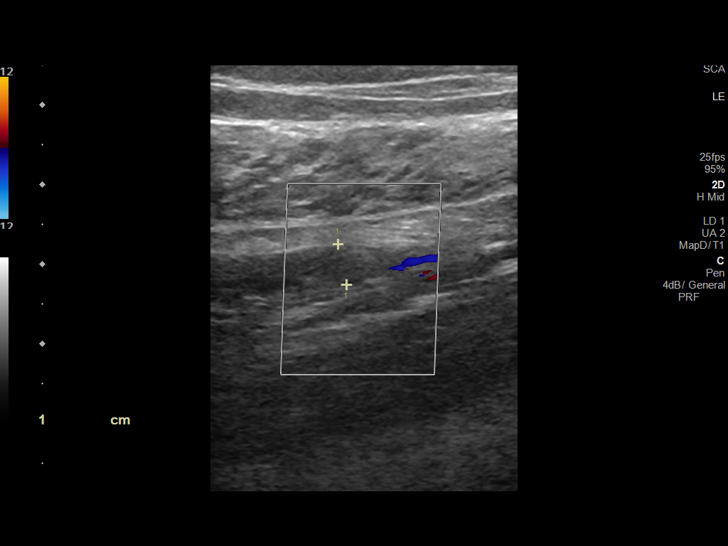
[im 32/36]
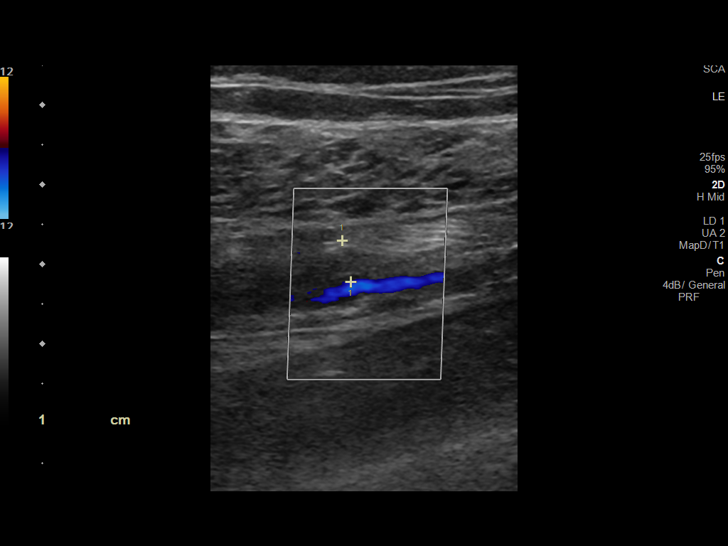
[im 36/36]
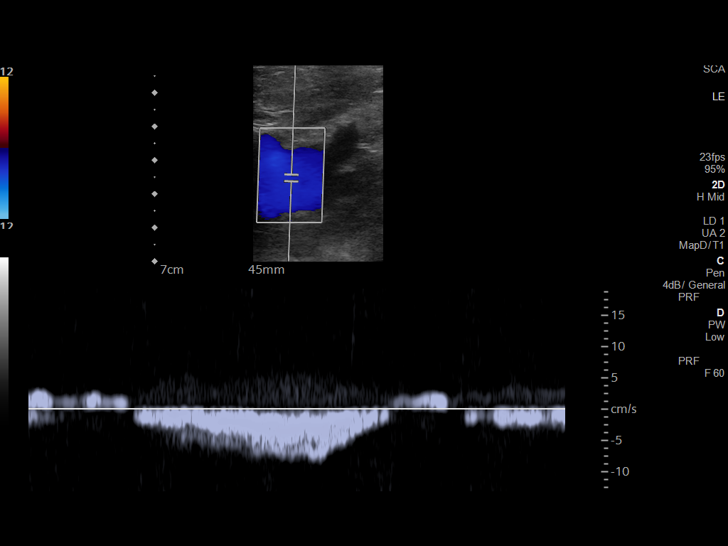

[13 of 24 positions shown; findings below may reference images not displayed]

FINDINGS: VENOUS

Normal compressibility of the common femoral, superficial femoral,
and popliteal veins. Visualized portions of profunda femoral vein
and great saphenous vein unremarkable. No filling defects to suggest
DVT on grayscale or color Doppler imaging. Doppler waveforms show
normal direction of venous flow, normal respiratory plasticity and
response to augmentation.

Evaluation of the infrapopliteal venous vasculature demonstrates
acute thrombosis of 1 of the paired posterior tibial veins of the
right lower extremity. This does not extend into the popliteal vein
itself. Remaining infrapopliteal venous vasculature is widely patent
and demonstrates appropriate compressibility.

Limited views of the contralateral common femoral vein are
unremarkable.

OTHER

None.

Limitations: none
IMPRESSION: 1. Acute infrapopliteal DVT involving the right posterior tibial
veins. No popliteal extension.

.

## 2020-12-09 ENCOUNTER — Telehealth: Payer: Self-pay | Admitting: Cardiology

## 2020-12-09 DIAGNOSIS — M25562 Pain in left knee: Secondary | ICD-10-CM | POA: Diagnosis not present

## 2020-12-09 DIAGNOSIS — M79605 Pain in left leg: Secondary | ICD-10-CM | POA: Diagnosis not present

## 2020-12-09 DIAGNOSIS — Z6841 Body Mass Index (BMI) 40.0 and over, adult: Secondary | ICD-10-CM | POA: Diagnosis not present

## 2020-12-09 NOTE — Telephone Encounter (Signed)
Spoke to the patient just now and let him know Dr. Hulen Shouts recommendations. He states he will go at this time.    Encouraged patient to call back with any questions or concerns.

## 2020-12-09 NOTE — Telephone Encounter (Signed)
Spoke to patient just now. He tells me that the pain behind his left leg on a scale from 1-5 is at a 5. He tells me that it is also difficult for him to walk on it. He does not think the area is red but it is hard for him to see it. It is hot to the touch.   He is wanting to know if Dr. Dulce Sellar would order testing to see if he has a blood clot there again as he has had this in the past. He would like to avoid the ED if at all possible.   I will route to Dr. Dulce Sellar at this time. The patient is aware that he is not in the office until this afternoon but would like to wait on his recommendation specifically.

## 2020-12-09 NOTE — Telephone Encounter (Signed)
   Pt said yesterday around noon, he started feeling pain on his left leg behind his knee, he is afraid its another blood clot. He wanted to know if Dr. Dulce Sellar will order a test for him or he need to go to the hospital.

## 2020-12-10 DIAGNOSIS — M5412 Radiculopathy, cervical region: Secondary | ICD-10-CM | POA: Diagnosis not present

## 2020-12-17 ENCOUNTER — Other Ambulatory Visit: Payer: Self-pay | Admitting: Cardiology

## 2021-01-01 DIAGNOSIS — M4802 Spinal stenosis, cervical region: Secondary | ICD-10-CM | POA: Diagnosis not present

## 2021-01-01 DIAGNOSIS — M2578 Osteophyte, vertebrae: Secondary | ICD-10-CM | POA: Diagnosis not present

## 2021-01-01 DIAGNOSIS — M542 Cervicalgia: Secondary | ICD-10-CM | POA: Diagnosis not present

## 2021-01-01 DIAGNOSIS — M4722 Other spondylosis with radiculopathy, cervical region: Secondary | ICD-10-CM | POA: Diagnosis not present

## 2021-01-04 ENCOUNTER — Other Ambulatory Visit: Payer: Self-pay | Admitting: Cardiology

## 2021-01-12 DIAGNOSIS — I10 Essential (primary) hypertension: Secondary | ICD-10-CM | POA: Diagnosis not present

## 2021-01-12 DIAGNOSIS — M5412 Radiculopathy, cervical region: Secondary | ICD-10-CM | POA: Diagnosis not present

## 2021-01-12 DIAGNOSIS — E1165 Type 2 diabetes mellitus with hyperglycemia: Secondary | ICD-10-CM | POA: Diagnosis not present

## 2021-01-12 DIAGNOSIS — E785 Hyperlipidemia, unspecified: Secondary | ICD-10-CM | POA: Diagnosis not present

## 2021-01-20 DIAGNOSIS — R109 Unspecified abdominal pain: Secondary | ICD-10-CM | POA: Diagnosis not present

## 2021-01-20 DIAGNOSIS — E785 Hyperlipidemia, unspecified: Secondary | ICD-10-CM | POA: Diagnosis not present

## 2021-01-20 DIAGNOSIS — I10 Essential (primary) hypertension: Secondary | ICD-10-CM | POA: Diagnosis not present

## 2021-01-20 DIAGNOSIS — E1165 Type 2 diabetes mellitus with hyperglycemia: Secondary | ICD-10-CM | POA: Diagnosis not present

## 2021-01-22 DIAGNOSIS — J069 Acute upper respiratory infection, unspecified: Secondary | ICD-10-CM | POA: Diagnosis not present

## 2021-01-22 DIAGNOSIS — R519 Headache, unspecified: Secondary | ICD-10-CM | POA: Diagnosis not present

## 2021-01-22 DIAGNOSIS — Z20828 Contact with and (suspected) exposure to other viral communicable diseases: Secondary | ICD-10-CM | POA: Diagnosis not present

## 2021-01-22 DIAGNOSIS — R059 Cough, unspecified: Secondary | ICD-10-CM | POA: Diagnosis not present

## 2021-01-22 DIAGNOSIS — R0981 Nasal congestion: Secondary | ICD-10-CM | POA: Diagnosis not present

## 2021-02-02 DIAGNOSIS — R319 Hematuria, unspecified: Secondary | ICD-10-CM | POA: Diagnosis not present

## 2021-02-02 DIAGNOSIS — K5790 Diverticulosis of intestine, part unspecified, without perforation or abscess without bleeding: Secondary | ICD-10-CM | POA: Diagnosis not present

## 2021-02-02 DIAGNOSIS — N281 Cyst of kidney, acquired: Secondary | ICD-10-CM | POA: Diagnosis not present

## 2021-02-02 DIAGNOSIS — K429 Umbilical hernia without obstruction or gangrene: Secondary | ICD-10-CM | POA: Diagnosis not present

## 2021-02-02 DIAGNOSIS — R109 Unspecified abdominal pain: Secondary | ICD-10-CM | POA: Diagnosis not present

## 2021-02-08 DIAGNOSIS — R3129 Other microscopic hematuria: Secondary | ICD-10-CM | POA: Diagnosis not present

## 2021-02-28 ENCOUNTER — Other Ambulatory Visit: Payer: Self-pay | Admitting: Cardiology

## 2021-03-03 DIAGNOSIS — R3912 Poor urinary stream: Secondary | ICD-10-CM | POA: Diagnosis not present

## 2021-03-03 DIAGNOSIS — N401 Enlarged prostate with lower urinary tract symptoms: Secondary | ICD-10-CM | POA: Diagnosis not present

## 2021-03-03 DIAGNOSIS — R310 Gross hematuria: Secondary | ICD-10-CM | POA: Diagnosis not present

## 2021-03-03 DIAGNOSIS — N281 Cyst of kidney, acquired: Secondary | ICD-10-CM | POA: Diagnosis not present

## 2021-03-04 DIAGNOSIS — M5416 Radiculopathy, lumbar region: Secondary | ICD-10-CM | POA: Diagnosis not present

## 2021-03-04 DIAGNOSIS — M5412 Radiculopathy, cervical region: Secondary | ICD-10-CM | POA: Diagnosis not present

## 2021-03-07 NOTE — Progress Notes (Signed)
Cardiology Office Note:    Date:  03/08/2021   ID:  [name], DOB Jan 29, 1965, MRN [id number]  PCP:  Jacelyn Pi, Lilia Argue, MD  Cardiologist:  Shirlee More, MD    Referring MD: Jacelyn Pi, Lilia Argue, *    ASSESSMENT:    1. CAD in native artery   2. Essential hypertension   3. Mixed hyperlipidemia   4. Type 2 diabetes mellitus without complication, with long-term current use of insulin (HCC)    PLAN:    In order of problems listed above:  He continues to do well with CAD recent myocardial perfusion study low risk no ischemia and normal ejection fraction and having no anginal discomfort after PCI and on current medical therapy he will continue treatment beta-blocker aspirin combined lipid-lowering high intensity statin and Zetia. Stable BP at target continue thiazide diuretic and ARB LDL at target continue combined therapy Stable diabetes   Next appointment: 9 months   Medication Adjustments/Labs and Tests Ordered: Current medicines are reviewed at length with the patient today.  Concerns regarding medicines are outlined above.  No orders of the defined types were placed in this encounter.  No orders of the defined types were placed in this encounter.  Chief complaint follow-up for CAD   History of Present Illness:    Brandon [name] is a 56 y.o. male with a hx of CAD with non-ST elevation MI and subsequent PCI and stent to right coronary artery and first marginal branch 04/23/2018, morbid obesity with gastric bypass surgery, type 2 diabetes, hypertension, hyperlipidemia and infrapopliteal deep vein thrombosis right lower extremity September 2021.  He was last seen 08/31/2020 with shortness of breath.  Myocardial perfusion study showed an ejection fraction 59% normal left ventricular function and normal perfusion without findings of ischemia..  His proBNP level was quite low at 26.  Compliance with diet, lifestyle and medications: Yes  Recently seen at [facility]  ED he thought he had a kidney stone he had a CT urogram done 02/02/2021 which commented on him having hepatic steatosis vascular atherosclerosis.  I reviewed with him is not uncommon to find this on CT scan a person with known vascular disease and is on appropriate medical treatment. From a cardiology perspective he is doing well no edema shortness of breath chest pain palpitation or syncope. He tolerates his lipid-lowering therapy without muscle pain or weakness. Recent labs 01/12/2021 showed LDL at target 40 cholesterol 104 triglycerides 94 and A1c mildly elevated 7.2 Past Medical History:  Diagnosis Date   ACS (acute coronary syndrome) ([place]) 04/2018   Acute coronary syndrome ([place]) 04/23/2018   Acute deep vein thrombosis (DVT) of tibial vein of right lower extremity ([place]) 10/21/2019   Formatting of this note might be different from the original. 09/24/2019: RIGHT LOWER EXTREMITY VENOUS DOPPLER ULTRASOUND Evaluation of the infrapopliteal venous vasculature demonstrates acute thrombosis of 1 of the paired posterior tibial veins of the right lower extremity. This does not extend into the popliteal vein itself   Arthritis    both knees   Arthritis of knee, degenerative 05/14/2015   CAD in native artery 05/06/2018   Chest pain 06/23/2018   Chronic right shoulder pain [id number]   Complication of anesthesia    " i HAVE A HARD TIME WAKING UP "   Coronary artery disease    Essential hypertension 05/14/2015   Facial twitching 09/10/2018   Gross hematuria 07/16/2019   Formatting of this note might be different from the original. 2021   Herpes  zoster without complication [id number]   History of non-ST elevation myocardial infarction (NSTEMI) 07/30/2018   Hypertension    Laryngopharyngeal reflux (LPR) 05/06/2019   Mixed hyperlipidemia 05/14/2015   Non-ST elevation (NSTEMI) myocardial infarction [facility])    Pre-diabetes 03/30/2016   2017: x/6.7 2018: 103/6.4   Right elbow pain 05/28/2015   SOB (shortness of breath)  on exertion 05/16/2018   Type 2 diabetes mellitus without complication, without long-term current use of insulin ([place]) 03/30/2016   Formatting of this note might be different from the original. 2017: x/6.7 2018: 103/6.4 2021: 7.8    Past Surgical History:  Procedure Laterality Date   CORONARY STENT INTERVENTION N/A 04/23/2018   Procedure: CORONARY STENT INTERVENTION;  Surgeon: Sherren Mocha, MD;  Location: [place] CV LAB;  Service: Cardiovascular;  Laterality: N/A;   EXCISION HAGLUND'S DEFORMITY WITH ACHILLES TENDON REPAIR Right 05/22/2014   Procedure: RIGHT ACHILLES DEBRIDEMENT; HAGLUND'S EXCISION ;  Surgeon: Wylene Simmer, MD;  Location: [place];  Service: Orthopedics;  Laterality: Right;   GASTROCNEMIUS RECESSION Right 05/22/2014   Procedure: GASTROC RECESSION ;  Surgeon: Wylene Simmer, MD;  Location: [place];  Service: Orthopedics;  Laterality: Right;   KNEE SURGERY     l/knee   LAPAROSCOPIC GASTRIC SLEEVE RESECTION     LEFT HEART CATH AND CORONARY ANGIOGRAPHY N/A 04/23/2018   Procedure: LEFT HEART CATH AND CORONARY ANGIOGRAPHY;  Surgeon: Sherren Mocha, MD;  Location: [place] CV LAB;  Service: Cardiovascular;  Laterality: N/A;   MOUTH SURGERY     Root canal    Current Medications: Current Meds  Medication Sig   aspirin 81 MG chewable tablet Chew 1 tablet (81 mg total) by mouth daily.   ezetimibe (ZETIA) 10 MG tablet TAKE 1 TABLET BY MOUTH EVERY DAY   metFORMIN (GLUCOPHAGE) 500 MG tablet Take 500 mg by mouth 2 (two) times daily.   metoprolol tartrate (LOPRESSOR) 25 MG tablet TAKE 1 TABLET BY MOUTH TWICE A DAY   Multiple Vitamin (MULTIVITAMIN WITH MINERALS) TABS tablet Take 1 tablet by mouth daily.   nitroGLYCERIN (NITROSTAT) 0.4 MG SL tablet Place 1 tablet (0.4 mg total) under the tongue every 5 (five) minutes as needed for chest pain.   pregabalin (LYRICA) 25 MG capsule Take 25 mg by mouth 3 (three) times daily.   rosuvastatin (CRESTOR) 40 MG  tablet Take 40 mg by mouth daily.   telmisartan-hydrochlorothiazide (MICARDIS HCT) 40-12.5 MG tablet TAKE 1 TABLET BY MOUTH EVERY DAY   Vitamin D, Ergocalciferol, (DRISDOL) 1.25 MG (50000 UNIT) CAPS capsule Take 50,000 Units by mouth once a week.     Allergies:   Brilinta [ticagrelor]   Social History   Socioeconomic History   Marital status: Married    Spouse name: Not on file   Number of children: Not on file   Years of education: Not on file   Highest education level: Not on file  Occupational History   Not on file  Tobacco Use   Smoking status: Former    Packs/day: 2.00    Years: 10.00    Pack years: 20.00    Types: Cigarettes   Smokeless tobacco: Never   Tobacco comments:    Quti smoking 20 years ago  Vaping Use   Vaping Use: Never used  Substance and Sexual Activity   Alcohol use: Yes    Comment: occ   Drug use: No   Sexual activity: Yes  Other Topics Concern   Not on file  Social History Narrative  Not on file   Social Determinants of Health   Financial Resource Strain: Not on file  Food Insecurity: Not on file  Transportation Needs: Not on file  Physical Activity: Not on file  Stress: Not on file  Social Connections: Not on file     Family History: The patient's family history includes Cancer in his mother; Heart disease (age of onset: 37) in his father; Hypertension in his father. ROS:   Please see the history of present illness.    All other systems reviewed and are negative.  EKGs/Labs/Other Studies Reviewed:    The following studies were reviewed today:   Recent Labs: 08/31/2020: ALT 50; BUN 14; Creatinine, Ser 0.85; NT-Pro BNP 26; Potassium 5.0; Sodium 138  Recent Lipid Panel    Component Value Date/Time   CHOL 140 08/31/2020 0856   TRIG 164 (H) 08/31/2020 0856   HDL 44 08/31/2020 0856   CHOLHDL 3.2 08/31/2020 0856   CHOLHDL 4.0 04/23/2018 1547   VLDL 35 04/23/2018 1547   LDLCALC 68 08/31/2020 0856    Physical Exam:    VS:  BP  118/70 (BP Location: Left Arm)    Pulse 68    Ht 6\' 2"  (1.88 m)    Wt (!) 363 lb (164.7 kg)    SpO2 96%    BMI 46.61 kg/m     Wt Readings from Last 3 Encounters:  03/08/21 (!) 363 lb (164.7 kg)  09/07/20 (!) 367 lb (166.5 kg)  08/31/20 (!) 367 lb 6.4 oz (166.7 kg)     GEN: Obese BMI approaches 47 well nourished, well developed in no acute distress HEENT: Normal NECK: No JVD; No carotid bruits LYMPHATICS: No lymphadenopathy CARDIAC: RRR, no murmurs, rubs, gallops RESPIRATORY:  Clear to auscultation without rales, wheezing or rhonchi  ABDOMEN: Soft, non-tender, non-distended MUSCULOSKELETAL:  No edema; No deformity  SKIN: Warm and dry NEUROLOGIC:  Alert and oriented x 3 PSYCHIATRIC:  Normal affect    Signed, Shirlee More, MD  03/08/2021 8:30 AM    [place]

## 2021-03-08 ENCOUNTER — Other Ambulatory Visit: Payer: Self-pay

## 2021-03-08 ENCOUNTER — Encounter: Payer: Self-pay | Admitting: Cardiology

## 2021-03-08 ENCOUNTER — Ambulatory Visit (INDEPENDENT_AMBULATORY_CARE_PROVIDER_SITE_OTHER): Payer: BC Managed Care – PPO | Admitting: Cardiology

## 2021-03-08 VITALS — BP 118/70 | HR 68 | Ht 74.0 in | Wt 363.0 lb

## 2021-03-08 DIAGNOSIS — I10 Essential (primary) hypertension: Secondary | ICD-10-CM

## 2021-03-08 DIAGNOSIS — E119 Type 2 diabetes mellitus without complications: Secondary | ICD-10-CM | POA: Diagnosis not present

## 2021-03-08 DIAGNOSIS — I2510 Atherosclerotic heart disease of native coronary artery without angina pectoris: Secondary | ICD-10-CM | POA: Diagnosis not present

## 2021-03-08 DIAGNOSIS — E782 Mixed hyperlipidemia: Secondary | ICD-10-CM | POA: Diagnosis not present

## 2021-03-08 DIAGNOSIS — Z794 Long term (current) use of insulin: Secondary | ICD-10-CM

## 2021-03-08 NOTE — Patient Instructions (Signed)
Medication Instructions:  ?Your physician recommends that you continue on your current medications as directed. Please refer to the Current Medication list given to you today. ? ?*If you need a refill on your cardiac medications before your next appointment, please call your pharmacy* ? ? ?Lab Work: ?NONE ?If you have labs (blood work) drawn today and your tests are completely normal, you will receive your results only by: ?MyChart Message (if you have MyChart) OR ?A paper copy in the mail ?If you have any lab test that is abnormal or we need to change your treatment, we will call you to review the results. ? ? ?Testing/Procedures: ?NONE ? ? ?Follow-Up: ?At [facility], you and your health needs are our priority.  As part of our continuing mission to provide you with exceptional heart care, we have created designated Provider Care Teams.  These Care Teams include your primary Cardiologist (physician) and Advanced Practice Providers (APPs -  Physician Assistants and Nurse Practitioners) who all work together to provide you with the care you need, when you need it. ? ?We recommend signing up for the patient portal called "MyChart".  Sign up information is provided on this After Visit Summary.  MyChart is used to connect with patients for Virtual Visits (Telemedicine).  Patients are able to view lab/test results, encounter notes, upcoming appointments, etc.  Non-urgent messages can be sent to your provider as well.   ?To learn more about what you can do with MyChart, go to [email or web address].   ? ?Your next appointment:   ?9 month(s) ? ?The format for your next appointment:   ?In Person ? ?Provider:   ?Brian Munley, MD   ? ? ?Other Instructions ?  ?

## 2021-03-13 IMAGING — CT CT ANGIO CHEST
2 of 9 series · 19 of 36 positions shown · IV contrast (Omnipaque)
Comparison: Chest CTA 06/24/2018.  Portable chest 02/01/2019.

CLINICAL DATA: 54-year-old male status post [id number]. 1 month ago
with persistent symptoms. Tachycardia. Neck pain.

EXAM:
CT ANGIOGRAPHY CHEST WITH CONTRAST
TECHNIQUE: Multidetector CT imaging of the chest was performed using the
standard protocol during bolus administration of intravenous
contrast. Multiplanar CT image reconstructions and MIPs were
obtained to evaluate the vascular anatomy.
CONTRAST:  100mL OMNIPAQUE IOHEXOL 350 MG/ML SOLN

[Series 7: pe coronal mpr · coronal · 0.67mm/px · 1 of 112 slices shown]
[im 56/112  mediastinal]
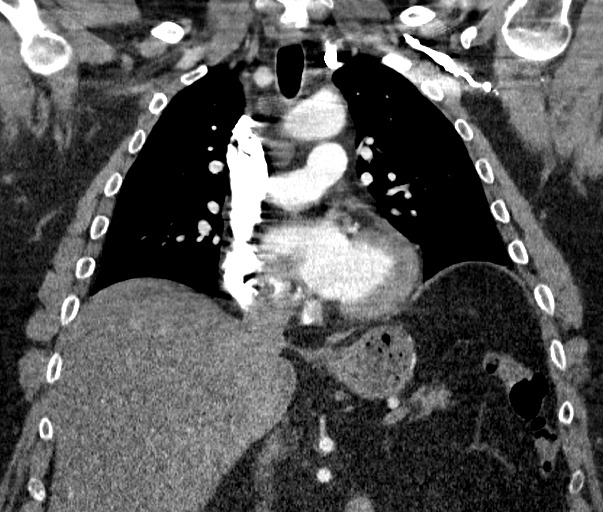

[Series 11: pe thins · axial · 0.87mm/px · z∈[+1000,+1311]mm · 18 of 461 slices shown]
[im 24/461  lung]
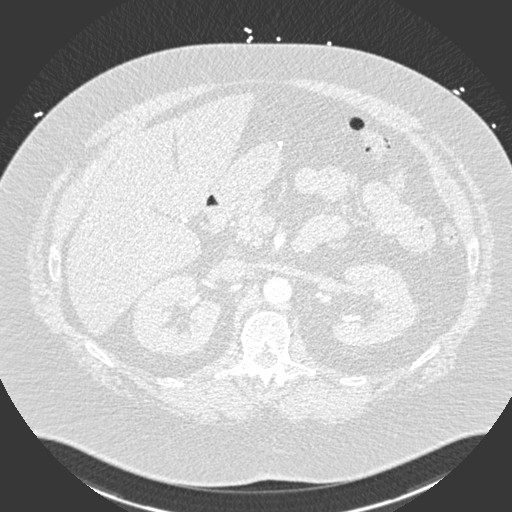
[im 47/461  mediastinal]
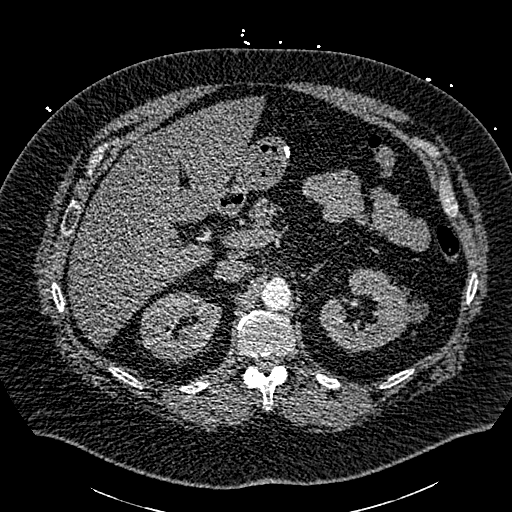
[im 70/461  lung]
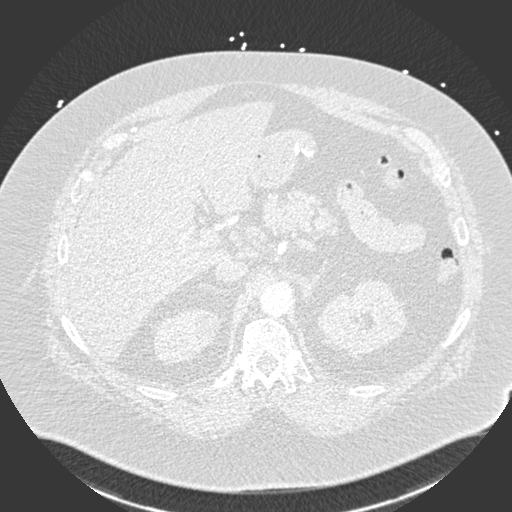
[im 93/461  mediastinal]
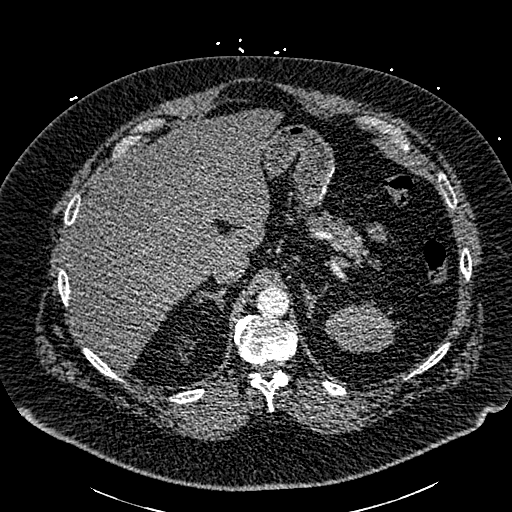
[im 116/461  lung]
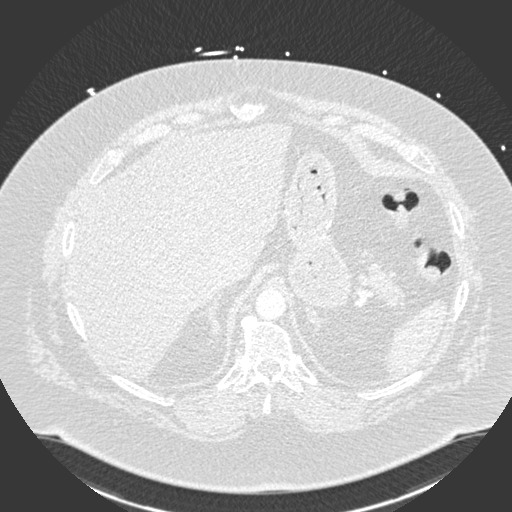
[im 139/461  mediastinal]
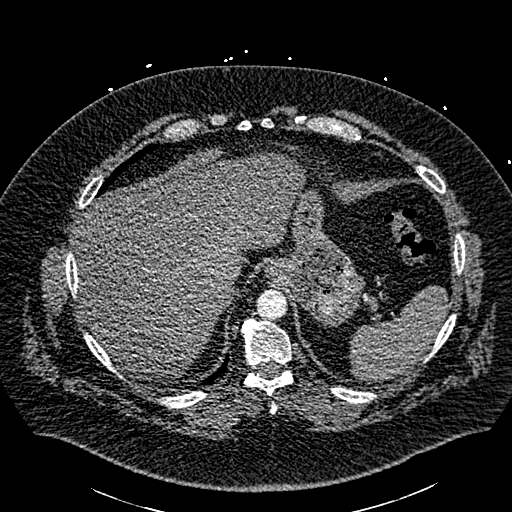
[im 162/461  lung]
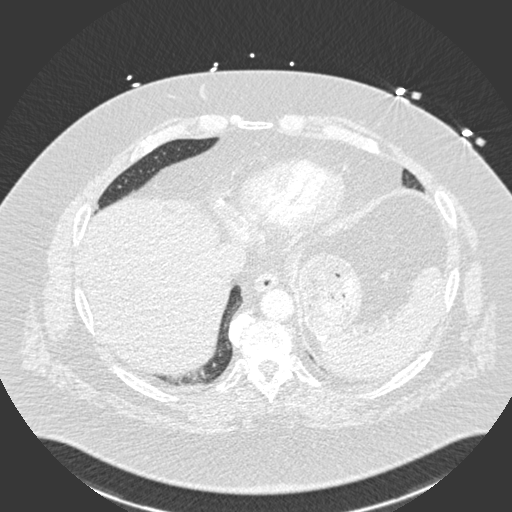
[im 185/461  mediastinal]
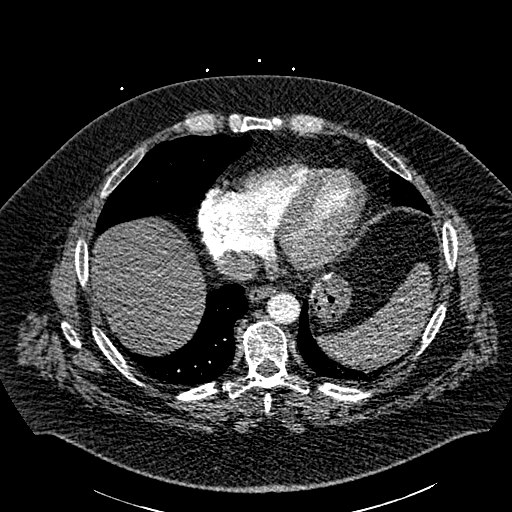
[im 208/461  lung]
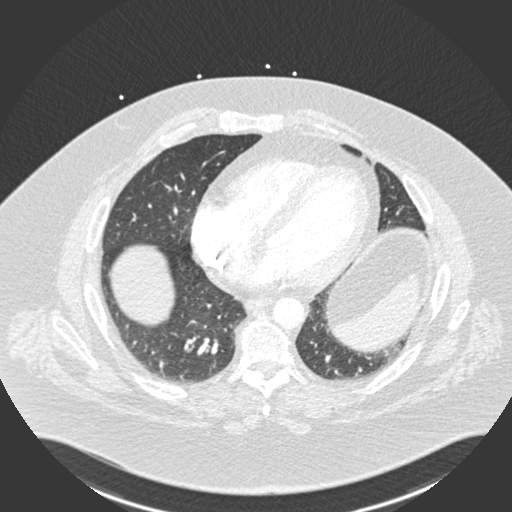
[im 254/461  mediastinal]
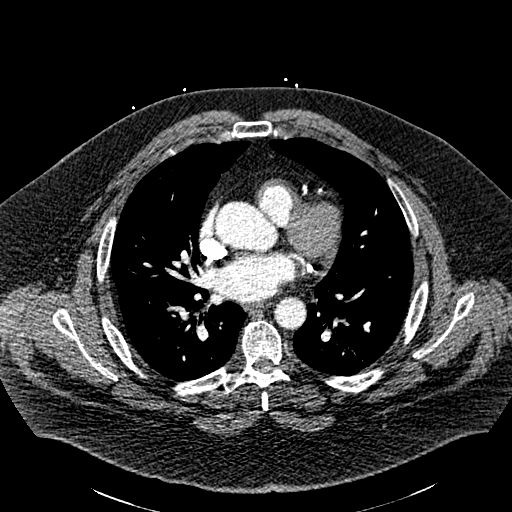
[im 277/461  lung]
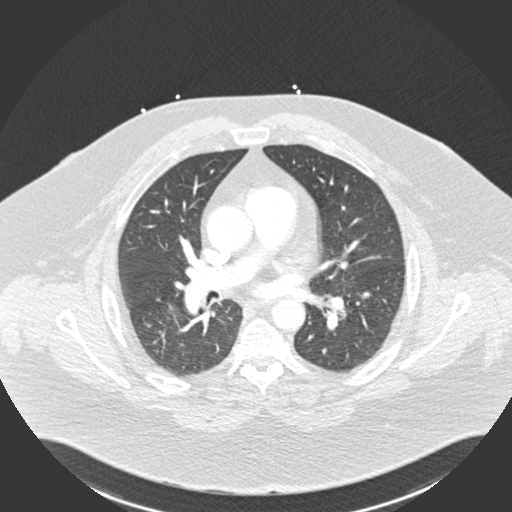
[im 300/461  mediastinal]
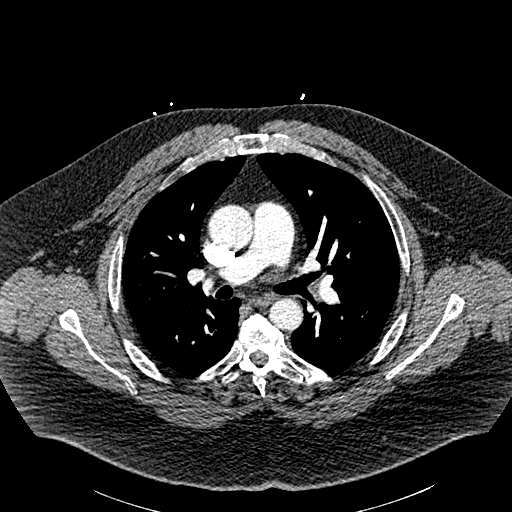
[im 323/461  lung]
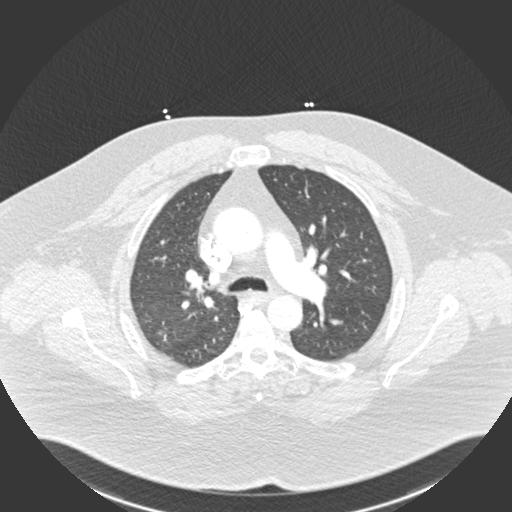
[im 346/461  mediastinal]
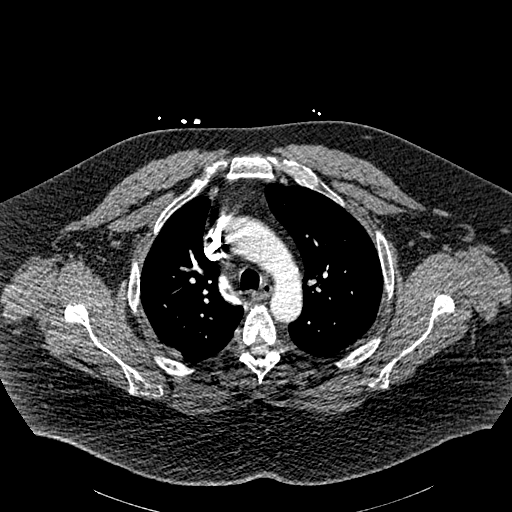
[im 369/461  lung]
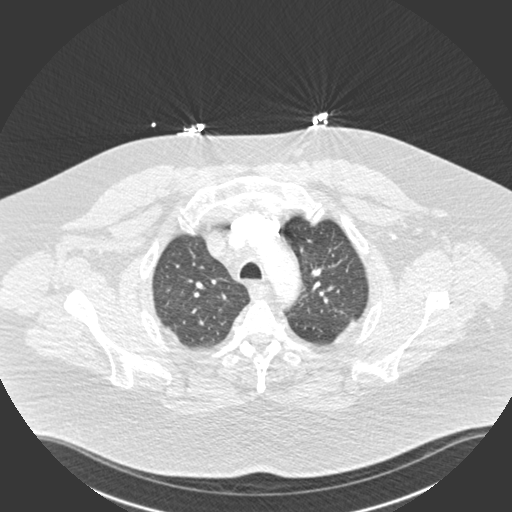
[im 392/461  mediastinal]
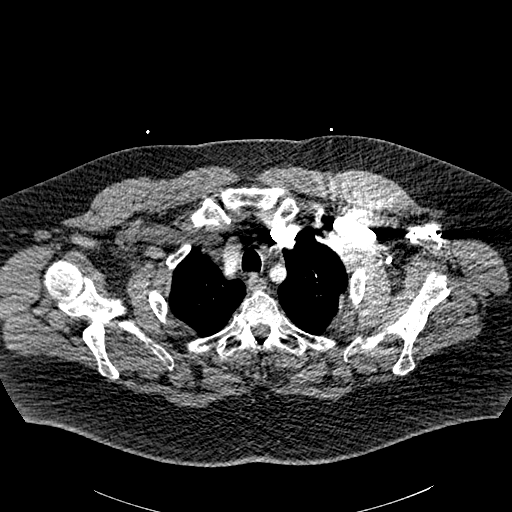
[im 415/461  lung]
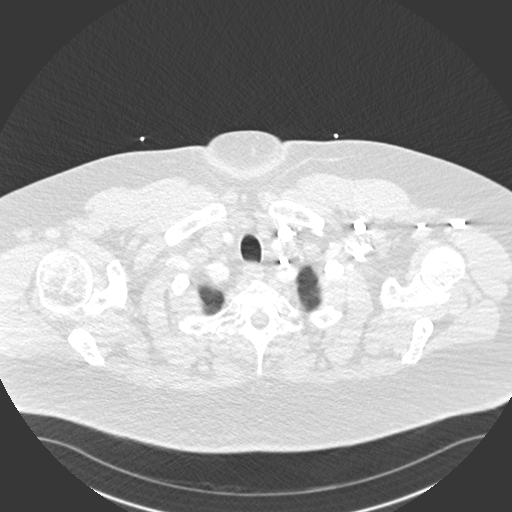
[im 438/461  mediastinal]
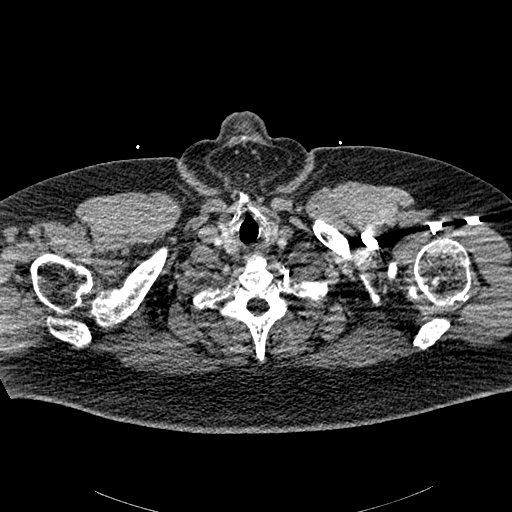

[19 of 36 positions shown; findings below may reference images not displayed]

FINDINGS: Cardiovascular: Suboptimal but adequate contrast bolus timing in the
pulmonary arterial tree. Mild respiratory motion.

No focal filling defect identified in the pulmonary arteries to
suggest acute pulmonary embolism.

Calcified coronary artery atherosclerosis. No cardiomegaly or
pericardial effusion. Negative visible aorta.

Mediastinum/Nodes: Negative.  No lymphadenopathy.

Lungs/Pleura: Major airways are patent. Lung volumes are stable
since 9696. No abnormal pulmonary opacity. Minor costophrenic angle
atelectasis. No pleural effusion.

Upper Abdomen: Chronic postoperative changes to the stomach. Absent
gallbladder. Aside from possible hepatic steatosis negative visible
liver, spleen, pancreas, adrenal glands and other bowel loops in the
upper abdomen. Small exophytic left renal cysts suspected.

Musculoskeletal: No acute osseous abnormality identified.

Review of the MIP images confirms the above findings.
IMPRESSION: 1. Negative for acute pulmonary embolus. No acute finding in the
chest.
2. Calcified coronary artery atherosclerosis.

## 2021-03-15 DIAGNOSIS — M2578 Osteophyte, vertebrae: Secondary | ICD-10-CM | POA: Diagnosis not present

## 2021-03-15 DIAGNOSIS — M5416 Radiculopathy, lumbar region: Secondary | ICD-10-CM | POA: Diagnosis not present

## 2021-03-15 DIAGNOSIS — M545 Low back pain, unspecified: Secondary | ICD-10-CM | POA: Diagnosis not present

## 2021-04-08 DIAGNOSIS — M5412 Radiculopathy, cervical region: Secondary | ICD-10-CM | POA: Diagnosis not present

## 2021-04-08 DIAGNOSIS — M5416 Radiculopathy, lumbar region: Secondary | ICD-10-CM | POA: Diagnosis not present

## 2021-04-19 DIAGNOSIS — M5416 Radiculopathy, lumbar region: Secondary | ICD-10-CM | POA: Diagnosis not present

## 2021-04-20 DIAGNOSIS — E785 Hyperlipidemia, unspecified: Secondary | ICD-10-CM | POA: Diagnosis not present

## 2021-04-20 DIAGNOSIS — I10 Essential (primary) hypertension: Secondary | ICD-10-CM | POA: Diagnosis not present

## 2021-04-20 DIAGNOSIS — E1165 Type 2 diabetes mellitus with hyperglycemia: Secondary | ICD-10-CM | POA: Diagnosis not present

## 2021-05-04 DIAGNOSIS — I2510 Atherosclerotic heart disease of native coronary artery without angina pectoris: Secondary | ICD-10-CM | POA: Diagnosis not present

## 2021-05-04 DIAGNOSIS — E785 Hyperlipidemia, unspecified: Secondary | ICD-10-CM | POA: Diagnosis not present

## 2021-05-04 DIAGNOSIS — I10 Essential (primary) hypertension: Secondary | ICD-10-CM | POA: Diagnosis not present

## 2021-05-04 DIAGNOSIS — E1165 Type 2 diabetes mellitus with hyperglycemia: Secondary | ICD-10-CM | POA: Diagnosis not present

## 2021-05-17 DIAGNOSIS — M5416 Radiculopathy, lumbar region: Secondary | ICD-10-CM | POA: Diagnosis not present

## 2021-05-17 DIAGNOSIS — M5412 Radiculopathy, cervical region: Secondary | ICD-10-CM | POA: Diagnosis not present

## 2021-06-13 DIAGNOSIS — J0100 Acute maxillary sinusitis, unspecified: Secondary | ICD-10-CM | POA: Diagnosis not present

## 2021-06-13 DIAGNOSIS — J209 Acute bronchitis, unspecified: Secondary | ICD-10-CM | POA: Diagnosis not present

## 2021-06-20 ENCOUNTER — Other Ambulatory Visit: Payer: Self-pay | Admitting: Cardiology

## 2021-06-21 NOTE — Telephone Encounter (Signed)
Rx refill sent to pharmacy. 

## 2021-07-19 DIAGNOSIS — G4733 Obstructive sleep apnea (adult) (pediatric): Secondary | ICD-10-CM | POA: Diagnosis not present

## 2021-07-19 DIAGNOSIS — Z6841 Body Mass Index (BMI) 40.0 and over, adult: Secondary | ICD-10-CM | POA: Diagnosis not present

## 2021-07-19 DIAGNOSIS — R0602 Shortness of breath: Secondary | ICD-10-CM | POA: Diagnosis not present

## 2021-07-19 DIAGNOSIS — I10 Essential (primary) hypertension: Secondary | ICD-10-CM | POA: Diagnosis not present

## 2021-07-19 DIAGNOSIS — R5383 Other fatigue: Secondary | ICD-10-CM | POA: Diagnosis not present

## 2021-07-20 DIAGNOSIS — R5383 Other fatigue: Secondary | ICD-10-CM | POA: Diagnosis not present

## 2021-07-20 DIAGNOSIS — R0602 Shortness of breath: Secondary | ICD-10-CM | POA: Diagnosis not present

## 2021-07-20 DIAGNOSIS — I10 Essential (primary) hypertension: Secondary | ICD-10-CM | POA: Diagnosis not present

## 2021-07-21 DIAGNOSIS — M5412 Radiculopathy, cervical region: Secondary | ICD-10-CM | POA: Diagnosis not present

## 2021-07-21 DIAGNOSIS — M5416 Radiculopathy, lumbar region: Secondary | ICD-10-CM | POA: Diagnosis not present

## 2021-08-03 DIAGNOSIS — G4733 Obstructive sleep apnea (adult) (pediatric): Secondary | ICD-10-CM | POA: Diagnosis not present

## 2021-08-03 DIAGNOSIS — Z6841 Body Mass Index (BMI) 40.0 and over, adult: Secondary | ICD-10-CM | POA: Diagnosis not present

## 2021-08-03 DIAGNOSIS — I471 Supraventricular tachycardia: Secondary | ICD-10-CM | POA: Diagnosis not present

## 2021-08-04 NOTE — Progress Notes (Deleted)
Cardiology Clinic Note   Patient Name: Brandon [name] Date of Encounter: 08/04/2021  Primary Care Provider:  Lezlie Lye, Meda Coffee, MD Primary Cardiologist:  Norman Herrlich, MD  Patient Profile    Brandon [name] 56 year old male presents to the clinic today for follow-up evaluation of his coronary artery disease and hyperlipidemia  Past Medical History    Past Medical History:  Diagnosis Date   ACS (acute coronary syndrome) (HCC) 04/2018   Acute coronary syndrome (HCC) 04/23/2018   Acute deep vein thrombosis (DVT) of tibial vein of right lower extremity (HCC) 10/21/2019   Formatting of this note might be different from the original. 09/24/2019: RIGHT LOWER EXTREMITY VENOUS DOPPLER ULTRASOUND Evaluation of the infrapopliteal venous vasculature demonstrates acute thrombosis of 1 of the paired posterior tibial veins of the right lower extremity. This does not extend into the popliteal vein itself   Arthritis    both knees   Arthritis of knee, degenerative 05/14/2015   CAD in native artery 05/06/2018   Chest pain 06/23/2018   Chronic right shoulder pain 05/28/2015   Complication of anesthesia    " i HAVE A HARD TIME WAKING UP "   Coronary artery disease    Essential hypertension 05/14/2015   Facial twitching 09/10/2018   Gross hematuria 07/16/2019   Formatting of this note might be different from the original. 2021   Herpes zoster without complication 05/20/2019   History of non-ST elevation myocardial infarction (NSTEMI) 07/30/2018   Hypertension    Laryngopharyngeal reflux (LPR) 05/06/2019   Mixed hyperlipidemia 05/14/2015   Non-ST elevation (NSTEMI) myocardial infarction [facility])    Pre-diabetes 03/30/2016   2017: x/6.7 2018: 103/6.4   Right elbow pain 05/28/2015   SOB (shortness of breath) on exertion 05/16/2018   Type 2 diabetes mellitus without complication, without long-term current use of insulin (HCC) 03/30/2016   Formatting of this note might be different from the original. 2017: x/6.7  2018: 103/6.4 2021: 7.8   Past Surgical History:  Procedure Laterality Date   CORONARY STENT INTERVENTION N/A 04/23/2018   Procedure: CORONARY STENT INTERVENTION;  Surgeon: Tonny Bollman, MD;  Location: [facility] INVASIVE CV LAB;  Service: Cardiovascular;  Laterality: N/A;   EXCISION HAGLUND'S DEFORMITY WITH ACHILLES TENDON REPAIR Right 05/22/2014   Procedure: RIGHT ACHILLES DEBRIDEMENT; HAGLUND'S EXCISION ;  Surgeon: Toni Arthurs, MD;  Location: [facility];  Service: Orthopedics;  Laterality: Right;   GASTROCNEMIUS RECESSION Right 05/22/2014   Procedure: GASTROC RECESSION ;  Surgeon: Toni Arthurs, MD;  Location: [facility];  Service: Orthopedics;  Laterality: Right;   KNEE SURGERY     l/knee   LAPAROSCOPIC GASTRIC SLEEVE RESECTION     LEFT HEART CATH AND CORONARY ANGIOGRAPHY N/A 04/23/2018   Procedure: LEFT HEART CATH AND CORONARY ANGIOGRAPHY;  Surgeon: Tonny Bollman, MD;  Location: [facility] INVASIVE CV LAB;  Service: Cardiovascular;  Laterality: N/A;   MOUTH SURGERY     Root canal    Allergies  Allergies  Allergen Reactions   Brilinta [Ticagrelor] Shortness Of Breath    History of Present Illness    Brandon [name] is a PMH of coronary artery disease with NSTEMI and subsequent PCI and DES to his right coronary artery, and first marginal branch 4/20.  PMH also includes morbid obesity status post gastric bypass surgery, hyperlipidemia, HTN, type 2 diabetes and infrapopliteal DVT right lower extremity 9/21.  He presented to the clinic 8/22 with shortness of breath.  His nuclear stress test showed an EF of 59%  and normal perfusion with no ischemia.  His proBNP was 26.  He followed up with Dr. Dulce Sellar on 03/08/2018.  During that time he remained stable from a cardiac standpoint.  He was not noted to have lower extremity swelling, denied chest pain and syncope.  He was tolerating statin therapy well.  His LDL 01/12/2021 was 40.  He presents to the clinic today for follow-up  evaluation states***  *** denies chest pain, shortness of breath, lower extremity edema, fatigue, palpitations, melena, hematuria, hemoptysis, diaphoresis, weakness, presyncope, syncope, orthopnea, and PND.   Coronary artery disease-no chest pain today.  Underwent stress testing 8/22 which showed normal perfusion and no ischemia. Continue aspirin, ezetimibe, metoprolol, rosuvastatin Heart healthy low-sodium diet-salty 6 given Increase physical activity as tolerated  Hyperlipidemia-LDL*** Continue aspirin, ezetimibe, rosuvastatin Heart healthy low-sodium high-fiber diet Increase physical activity as tolerated Repeat fasting lipids and LFTs  Essential hypertension-BP today***.  Well-controlled at home. Continue telmisartan, HCTZ, metoprolol Heart healthy low-sodium diet-salty 6 given Increase physical activity as tolerated  Palpitations-reports intermittent episodes of accelerated heart rate.  More cardiac event monitor 3/22 which showed predominantly sinus rhythm with 9 episodes of SVT with the fastest HR of 218 bpm and the longest run being 15 beats.  His triggered events corresponded mainly with sinus rhythm.  (Rare ventricular and SVT ectopy) Continue metoprolol-May take an extra half dose of metoprolol for extended periods of fast heart rate. Heart healthy low-sodium diet Increase physical activity as tolerated Avoid triggers caffeine, chocolate, EtOH, dehydration etc. Medical maneuvers-reviewed  Disposition: Follow-up with Dr. Dulce Sellar or APP in 9-12 months.  Home Medications    Prior to Admission medications   Medication Sig Start Date End Date Taking? Authorizing Provider  aspirin 81 MG chewable tablet Chew 1 tablet (81 mg total) by mouth daily. 04/24/18   Barrett, Joline Salt, PA-C  ezetimibe (ZETIA) 10 MG tablet TAKE 1 TABLET BY MOUTH EVERY DAY 01/05/21   Baldo Daub, MD  metFORMIN (GLUCOPHAGE) 500 MG tablet Take 500 mg by mouth 2 (two) times daily. 05/07/19   [provider]  metoprolol tartrate (LOPRESSOR) 25 MG tablet TAKE 1 TABLET BY MOUTH TWICE A DAY 06/21/21   Baldo Daub, MD  Multiple Vitamin (MULTIVITAMIN WITH MINERALS) TABS tablet Take 1 tablet by mouth daily.    [provider]  nitroGLYCERIN (NITROSTAT) 0.4 MG SL tablet Place 1 tablet (0.4 mg total) under the tongue every 5 (five) minutes as needed for chest pain. 04/24/18   Barrett, Joline Salt, PA-C  pregabalin (LYRICA) 25 MG capsule Take 25 mg by mouth 3 (three) times daily. 03/04/21   [provider]  rosuvastatin (CRESTOR) 40 MG tablet Take 40 mg by mouth daily. 08/19/19   [provider]  telmisartan-hydrochlorothiazide (MICARDIS HCT) 40-12.5 MG tablet TAKE 1 TABLET BY MOUTH EVERY DAY 03/01/21   Baldo Daub, MD  Vitamin D, Ergocalciferol, (DRISDOL) 1.25 MG (50000 UNIT) CAPS capsule Take 50,000 Units by mouth once a week. 04/21/19   [provider]    Family History    Family History  Problem Relation Age of Onset   Cancer Mother    Heart disease Father 4   Hypertension Father    He indicated that his mother is deceased. He indicated that his father is deceased. He indicated that his sister is alive. He indicated that his paternal uncle is deceased.  Social History    Social History   Socioeconomic History   Marital status: Married    Spouse  name: Not on file   Number of children: Not on file   Years of education: Not on file   Highest education level: Not on file  Occupational History   Not on file  Tobacco Use   Smoking status: Former    Packs/day: 2.00    Years: 10.00    Total pack years: 20.00    Types: Cigarettes   Smokeless tobacco: Never   Tobacco comments:    Quti smoking 20 years ago  Vaping Use   Vaping Use: Never used  Substance and Sexual Activity   Alcohol use: Yes    Comment: occ   Drug use: No   Sexual activity: Yes  Other Topics Concern   Not on file  Social History Narrative   Not on file   Social  Determinants of Health   Financial Resource Strain: Not on file  Food Insecurity: Not on file  Transportation Needs: Not on file  Physical Activity: Not on file  Stress: Not on file  Social Connections: Not on file  Intimate Partner Violence: Not on file     Review of Systems    General:  No chills, fever, night sweats or weight changes.  Cardiovascular:  No chest pain, dyspnea on exertion, edema, orthopnea, palpitations, paroxysmal nocturnal dyspnea. Dermatological: No rash, lesions/masses Respiratory: No cough, dyspnea Urologic: No hematuria, dysuria Abdominal:   No nausea, vomiting, diarrhea, bright red blood per rectum, melena, or hematemesis Neurologic:  No visual changes, wkns, changes in mental status. All other systems reviewed and are otherwise negative except as noted above.  Physical Exam    VS:  There were no vitals taken for this visit. , BMI There is no height or weight on file to calculate BMI. GEN: Well nourished, well developed, in no acute distress. HEENT: normal. Neck: Supple, no JVD, carotid bruits, or masses. Cardiac: RRR, no murmurs, rubs, or gallops. No clubbing, cyanosis, edema.  Radials/DP/PT 2+ and equal bilaterally.  Respiratory:  Respirations regular and unlabored, clear to auscultation bilaterally. GI: Soft, nontender, nondistended, BS + x 4. MS: no deformity or atrophy. Skin: warm and dry, no rash. Neuro:  Strength and sensation are intact. Psych: Normal affect.  Accessory Clinical Findings    Recent Labs: 08/31/2020: ALT 50; BUN 14; Creatinine, Ser 0.85; NT-Pro BNP 26; Potassium 5.0; Sodium 138   Recent Lipid Panel    Component Value Date/Time   CHOL 140 08/31/2020 0856   TRIG 164 (H) 08/31/2020 0856   HDL 44 08/31/2020 0856   CHOLHDL 3.2 08/31/2020 0856   CHOLHDL 4.0 04/23/2018 1547   VLDL 35 04/23/2018 1547   LDLCALC 68 08/31/2020 0856    ECG personally reviewed by me today- *** - No acute changes  Cardiac event monitor  03/12/20 Patch Wear Time:  13 days and 2 hours (2022-02-08T16:19:57-0500 to 2022-02-21T18:56:29-0500)   Patient had a min HR of 42 bpm, max HR of 218 bpm, and avg HR of 74 bpm. Predominant underlying rhythm was Sinus Rhythm. 9 Supraventricular Tachycardia runs occurred, the run with the fastest interval lasting 6 beats with a max rate of 218 bpm, the  longest lasting 15 beats with an avg rate of 110 bpm. Isolated SVEs were rare (<1.0%), SVE Couplets were rare (<1.0%), and SVE Triplets were rare (<1.0%). Isolated VEs were rare (<1.0%), VE Couplets were rare (<1.0%), and no VE Triplets were present.  Ventricular Bigeminy and Trigeminy were present.    Cardiac rhythm throughout was sinus with average, minimum maximal heart rates of  74, 42 and 136 bpm. There were no pauses of 3 seconds or greater and there were no episodes of second or third-degree AV node block or sinus node exit block. Ventricular ectopy was rare with isolated PVCs and couplets. Supraventricular ectopy was rare with no episodes of atrial fibrillation or flutter.  There were 9 runs of atrial premature contractions the longest 15 complexes rate of 110 bpm, atrial tachycardia. There were 8 triggered and 7 diary events 2 associated with PVCs 1 associated sinus bradycardia 42 bpm and the others sinus rhythm.   Conclusion rare ventricular and supraventricular ectopy, the triggered and symptomatic events were predominantly sinus rhythm.  Nuclear stress test 09/08/2020   Clinically negative, electrically negative for ischemia.   No ST deviation was noted.   LV perfusion is normal. There is no evidence of ischemia.   Nuclear stress EF: 59 %. The left ventricular ejection fraction is normal (55-65%). Left ventricular function is normal. End diastolic cavity size is normal.    Assessment & Plan   1.  ***   Thomasene Ripple. Bernerd Terhune NP-C     08/04/2021, 11:12 AM [facility] [address] Office  [phone] Fax [phone]  Notice: This dictation was prepared with Dragon dictation along with smaller phrase technology. Any transcriptional errors that result from this process are unintentional and may not be corrected upon review.  I spent***minutes examining this patient, reviewing medications, and using patient centered shared decision making involving her cardiac care.  Prior to her visit I spent greater than 20 minutes reviewing her past medical history,  medications, and prior cardiac tests.

## 2021-08-05 ENCOUNTER — Ambulatory Visit: Payer: BC Managed Care – PPO | Admitting: General Practice

## 2021-08-05 ENCOUNTER — Other Ambulatory Visit: Payer: Self-pay

## 2021-08-05 NOTE — Progress Notes (Signed)
Cardiology Office Note:    Date:  08/06/2021   ID:  Brandon [name], DOB 12/10/1965, MRN [id number]  PCP:  Lezlie Lye, Meda Coffee, MD  Cardiologist:  Norman Herrlich, MD    Referring MD: Lezlie Lye, Meda Coffee, *    ASSESSMENT:    1. SVT (supraventricular tachycardia) (HCC)   2. Palpitations   3. CAD in native artery   4. Essential hypertension   5. Mixed hyperlipidemia   6. Obstructive sleep apnea syndrome    PLAN:    In order of problems listed above:  I reviewed the results of the monitor with him.  He has brief atrial tachycardia asymptomatic he is on a beta-blocker I would continue the same I encouraged him to continue to self monitor with his Apple Watch.  In particular he does not have ventricular tachycardia and he is reassured CAD is stable following PCI and on current medical treatment we will continue his aspirin beta-blocker and high intensity statin. Hypertension is controlled continue his current treatment Awaiting CPAP titration   Next appointment: I will plan to see him in 6 months he signed up in my chart and if he gets rate alerts or captures a strip on his monitor he can send it to me directly   Medication Adjustments/Labs and Tests Ordered: Current medicines are reviewed at length with the patient today.  Concerns regarding medicines are outlined above.  Orders Placed This Encounter  Procedures   EKG 12-Lead   No orders of the defined types were placed in this encounter.   Chief complaint I wore a monitor and I am concerned regarding ventricular tachycardia   History of Present Illness:    Brandon [name] is a 56 y.o. male with a hx of CAD with non-ST elevation MI PCI and stent right coronary artery and first marginal branch 04/23/2018 morbid obesity gastric bypass surgery type 2 diabetes hypertension hyperlipidemia and infrapopliteal deep vein thrombosis right lower extremity September 2021.  He was last seen 03/08/2021.  He has been seen today he would  had an event monitor with his PCP that I have requested described as multiple episodes of SVT and 1 episode of ventricular tachycardia.  Compliance with diet, lifestyle and medications: Yes  He is seen by me in the office at his request.  He had an episode earlier this month where he felt that his thought was incorrect was worried he may have a stroke as part of the evaluation including an event monitor that he use for 7 days his rhythm was sinus throughout he had no conduction system disease no bradycardia and although both ventricular and supraventricular ectopy was rare had a couple brief runs of atrial tachycardia 1 event that was interpreted as ventricular tachycardia but is abberant conduction.  He wears a smart watch and has had no high rate alerts and no alerts for atrial fibrillation he does not have palpitation or syncope.  He is awaiting another sleep study for CPAP titration he has had no recurrent episodes otherwise he is doing well he is having no angina edema shortness of breath or syncope Past Medical History:  Diagnosis Date   ACS (acute coronary syndrome) (HCC) 04/2018   Acute coronary syndrome (HCC) 04/23/2018   Acute deep vein thrombosis (DVT) of tibial vein of right lower extremity (HCC) 10/21/2019   Formatting of this note might be different from the original. 09/24/2019: RIGHT LOWER EXTREMITY VENOUS DOPPLER ULTRASOUND Evaluation of the infrapopliteal venous vasculature demonstrates acute thrombosis of 1 of  the paired posterior tibial veins of the right lower extremity. This does not extend into the popliteal vein itself   Arthritis    both knees   Arthritis of knee, degenerative 05/14/2015   CAD in native artery 05/06/2018   Chest pain 06/23/2018   Chronic right shoulder pain 05/28/2015   Complication of anesthesia    " i HAVE A HARD TIME WAKING UP "   Coronary artery disease    COVID-19 02/03/2020   DVT (deep venous thrombosis) (HCC) 10/21/2019   Formatting of this note might be  different from the original. 09/24/2019: RIGHT LOWER EXTREMITY VENOUS DOPPLER ULTRASOUND Evaluation of the infrapopliteal venous vasculature demonstrates acute thrombosis of 1 of the paired posterior tibial veins of the right lower extremity. This does not extend into the popliteal vein itself   Essential hypertension 05/14/2015   Facial twitching 09/10/2018   Gross hematuria 07/16/2019   Formatting of this note might be different from the original. 2021   Herpes zoster without complication 05/20/2019   History of non-ST elevation myocardial infarction (NSTEMI) 07/30/2018   Hypertension    Laryngopharyngeal reflux (LPR) 05/06/2019   Mixed hyperlipidemia 05/14/2015   Non-ST elevation (NSTEMI) myocardial infarction [facility])    Pre-diabetes 03/30/2016   2017: x/6.7 2018: 103/6.4   Right elbow pain 05/28/2015   SOB (shortness of breath) on exertion 05/16/2018   Type 2 diabetes mellitus without complication, without long-term current use of insulin (HCC) 03/30/2016   Formatting of this note might be different from the original. 2017: x/6.7 2018: 103/6.4 2021: 7.8    Past Surgical History:  Procedure Laterality Date   CORONARY STENT INTERVENTION N/A 04/23/2018   Procedure: CORONARY STENT INTERVENTION;  Surgeon: Tonny Bollman, MD;  Location: [facility] INVASIVE CV LAB;  Service: Cardiovascular;  Laterality: N/A;   EXCISION HAGLUND'S DEFORMITY WITH ACHILLES TENDON REPAIR Right 05/22/2014   Procedure: RIGHT ACHILLES DEBRIDEMENT; HAGLUND'S EXCISION ;  Surgeon: Toni Arthurs, MD;  Location: [facility];  Service: Orthopedics;  Laterality: Right;   GASTROCNEMIUS RECESSION Right 05/22/2014   Procedure: GASTROC RECESSION ;  Surgeon: Toni Arthurs, MD;  Location: [facility];  Service: Orthopedics;  Laterality: Right;   KNEE SURGERY     l/knee   LAPAROSCOPIC GASTRIC SLEEVE RESECTION     LEFT HEART CATH AND CORONARY ANGIOGRAPHY N/A 04/23/2018   Procedure: LEFT HEART CATH AND CORONARY ANGIOGRAPHY;  Surgeon:  Tonny Bollman, MD;  Location: [facility] INVASIVE CV LAB;  Service: Cardiovascular;  Laterality: N/A;   MOUTH SURGERY     Root canal    Current Medications: Current Meds  Medication Sig   amLODipine (NORVASC) 2.5 MG tablet Take 2.5 mg by mouth daily.   aspirin 81 MG chewable tablet Chew 1 tablet (81 mg total) by mouth daily.   ezetimibe (ZETIA) 10 MG tablet TAKE 1 TABLET BY MOUTH EVERY DAY   meloxicam (MOBIC) 15 MG tablet Take 15 mg by mouth daily as needed for pain.   metFORMIN (GLUCOPHAGE) 500 MG tablet Take 500 mg by mouth 2 (two) times daily.   metoprolol tartrate (LOPRESSOR) 25 MG tablet TAKE 1 TABLET BY MOUTH TWICE A DAY   Multiple Vitamin (MULTIVITAMIN WITH MINERALS) TABS tablet Take 1 tablet by mouth daily.   nitroGLYCERIN (NITROSTAT) 0.4 MG SL tablet Place 1 tablet (0.4 mg total) under the tongue every 5 (five) minutes as needed for chest pain.   OZEMPIC, 0.25 OR 0.5 MG/DOSE, 2 MG/3ML SOPN Inject 0.5 mg into the skin once a week.  pregabalin (LYRICA) 25 MG capsule Take 25 mg by mouth 3 (three) times daily.   rosuvastatin (CRESTOR) 40 MG tablet Take 40 mg by mouth daily.   telmisartan-hydrochlorothiazide (MICARDIS HCT) 40-12.5 MG tablet TAKE 1 TABLET BY MOUTH EVERY DAY   Vitamin D, Ergocalciferol, (DRISDOL) 1.25 MG (50000 UNIT) CAPS capsule Take 50,000 Units by mouth once a week.     Allergies:   Brilinta [ticagrelor]   Social History   Socioeconomic History   Marital status: Married    Spouse name: Not on file   Number of children: Not on file   Years of education: Not on file   Highest education level: Not on file  Occupational History   Not on file  Tobacco Use   Smoking status: Former    Packs/day: 2.00    Years: 10.00    Total pack years: 20.00    Types: Cigarettes   Smokeless tobacco: Never   Tobacco comments:    Quti smoking 20 years ago  Vaping Use   Vaping Use: Never used  Substance and Sexual Activity   Alcohol use: Yes    Comment: occ   Drug use: No    Sexual activity: Yes  Other Topics Concern   Not on file  Social History Narrative   Not on file   Social Determinants of Health   Financial Resource Strain: Not on file  Food Insecurity: Not on file  Transportation Needs: Not on file  Physical Activity: Not on file  Stress: Not on file  Social Connections: Not on file     Family History: The patient's family history includes Cancer in his mother; Heart disease (age of onset: 53) in his father; Hypertension in his father. ROS:   Please see the history of present illness.    All other systems reviewed and are negative.  EKGs/Labs/Other Studies Reviewed:    The following studies were reviewed today:  EKG:  EKG ordered today and personally reviewed.  The ekg ordered today demonstrates sinus rhythm left posterior fascicular block otherwise normal EKG  Recent Labs: 08/31/2020: ALT 50; BUN 14; Creatinine, Ser 0.85; NT-Pro BNP 26; Potassium 5.0; Sodium 138  Recent Lipid Panel    Component Value Date/Time   CHOL 140 08/31/2020 0856   TRIG 164 (H) 08/31/2020 0856   HDL 44 08/31/2020 0856   CHOLHDL 3.2 08/31/2020 0856   CHOLHDL 4.0 04/23/2018 1547   VLDL 35 04/23/2018 1547   LDLCALC 68 08/31/2020 0856    Physical Exam:    VS:  BP 130/82 (BP Location: Right Arm, Patient Position: Sitting, Cuff Size: Normal)   Pulse 76   Ht 6\' 2"  (1.88 m)   Wt (!) 360 lb (163.3 kg)   SpO2 92%   BMI 46.22 kg/m     Wt Readings from Last 3 Encounters:  08/06/21 (!) 360 lb (163.3 kg)  03/08/21 (!) 363 lb (164.7 kg)  09/07/20 (!) 367 lb (166.5 kg)     GEN:  Well nourished, well developed in no acute distress HEENT: Normal NECK: No JVD; No carotid bruits LYMPHATICS: No lymphadenopathy CARDIAC: RRR, no murmurs, rubs, gallops RESPIRATORY:  Clear to auscultation without rales, wheezing or rhonchi  ABDOMEN: Soft, non-tender, non-distended MUSCULOSKELETAL:  No edema; No deformity  SKIN: Warm and dry NEUROLOGIC:  Alert and oriented x  3 PSYCHIATRIC:  Normal affect    Signed, 09/09/20, MD  08/06/2021 12:07 PM    [facility]

## 2021-08-06 ENCOUNTER — Encounter: Payer: Self-pay | Admitting: Cardiology

## 2021-08-06 ENCOUNTER — Ambulatory Visit (INDEPENDENT_AMBULATORY_CARE_PROVIDER_SITE_OTHER): Payer: BC Managed Care – PPO | Admitting: Cardiology

## 2021-08-06 VITALS — BP 130/82 | HR 76 | Ht 74.0 in | Wt 360.0 lb

## 2021-08-06 DIAGNOSIS — I471 Supraventricular tachycardia: Secondary | ICD-10-CM | POA: Diagnosis not present

## 2021-08-06 DIAGNOSIS — R002 Palpitations: Secondary | ICD-10-CM

## 2021-08-06 DIAGNOSIS — I2510 Atherosclerotic heart disease of native coronary artery without angina pectoris: Secondary | ICD-10-CM

## 2021-08-06 DIAGNOSIS — I10 Essential (primary) hypertension: Secondary | ICD-10-CM | POA: Diagnosis not present

## 2021-08-06 DIAGNOSIS — E782 Mixed hyperlipidemia: Secondary | ICD-10-CM

## 2021-08-06 DIAGNOSIS — G4733 Obstructive sleep apnea (adult) (pediatric): Secondary | ICD-10-CM

## 2021-08-06 NOTE — Patient Instructions (Signed)
Medication Instructions:  Your physician recommends that you continue on your current medications as directed. Please refer to the Current Medication list given to you today.  *If you need a refill on your cardiac medications before your next appointment, please call your pharmacy*   Lab Work: None If you have labs (blood work) drawn today and your tests are completely normal, you will receive your results only by: MyChart Message (if you have MyChart) OR A paper copy in the mail If you have any lab test that is abnormal or we need to change your treatment, we will call you to review the results.   Testing/Procedures: None   Follow-Up: At [facility], you and your health needs are our priority.  As part of our continuing mission to provide you with exceptional heart care, we have created designated Provider Care Teams.  These Care Teams include your primary Cardiologist (physician) and Advanced Practice Providers (APPs -  Physician Assistants and Nurse Practitioners) who all work together to provide you with the care you need, when you need it.  We recommend signing up for the patient portal called "MyChart".  Sign up information is provided on this After Visit Summary.  MyChart is used to connect with patients for Virtual Visits (Telemedicine).  Patients are able to view lab/test results, encounter notes, upcoming appointments, etc.  Non-urgent messages can be sent to your provider as well.   To learn more about what you can do with MyChart, go to [email or web address].    Your next appointment:   6 month(s)  The format for your next appointment:   In Person  Provider:   Norman Herrlich, MD    Other Instructions Set-up Apple watch  Important Information About Sugar

## 2021-08-23 DIAGNOSIS — M5416 Radiculopathy, lumbar region: Secondary | ICD-10-CM | POA: Diagnosis not present

## 2021-08-26 DIAGNOSIS — G4733 Obstructive sleep apnea (adult) (pediatric): Secondary | ICD-10-CM | POA: Diagnosis not present

## 2021-08-31 DIAGNOSIS — M545 Low back pain, unspecified: Secondary | ICD-10-CM | POA: Diagnosis not present

## 2021-08-31 DIAGNOSIS — M5416 Radiculopathy, lumbar region: Secondary | ICD-10-CM | POA: Diagnosis not present

## 2021-09-01 ENCOUNTER — Telehealth: Payer: Self-pay

## 2021-09-01 DIAGNOSIS — M48062 Spinal stenosis, lumbar region with neurogenic claudication: Secondary | ICD-10-CM | POA: Diagnosis not present

## 2021-09-01 DIAGNOSIS — M545 Low back pain, unspecified: Secondary | ICD-10-CM | POA: Diagnosis not present

## 2021-09-01 DIAGNOSIS — M5416 Radiculopathy, lumbar region: Secondary | ICD-10-CM | POA: Diagnosis not present

## 2021-09-01 NOTE — Telephone Encounter (Signed)
   Pre-operative Risk Assessment    Patient Name: Brandon [name]  DOB: [birth year] MRN: [id number]      Request for Surgical Clearance    Procedure:   L2-3 Lam  Date of Surgery:  Clearance TBD                                 Surgeon:  Patricia Nettle, MD, FAAOS Surgeon's Group or Practice Name:  [facility] Phone number:  [phone] Fax number:  [phone]   Type of Clearance Requested:   - Pharmacy:  Hold Aspirin / Medical    Type of Anesthesia:  General    Additional requests/questions:    [name]   09/01/2021, 4:59 PM

## 2021-09-02 DIAGNOSIS — R0683 Snoring: Secondary | ICD-10-CM | POA: Diagnosis not present

## 2021-09-02 DIAGNOSIS — Z01818 Encounter for other preprocedural examination: Secondary | ICD-10-CM | POA: Diagnosis not present

## 2021-09-02 DIAGNOSIS — E1165 Type 2 diabetes mellitus with hyperglycemia: Secondary | ICD-10-CM | POA: Diagnosis not present

## 2021-09-02 DIAGNOSIS — Z6841 Body Mass Index (BMI) 40.0 and over, adult: Secondary | ICD-10-CM | POA: Diagnosis not present

## 2021-09-02 NOTE — Telephone Encounter (Signed)
   Name: Brandon [name]  DOB: December 18, 1965  MRN: [id number]   Primary Cardiologist: Norman Herrlich, MD  Chart reviewed as part of pre-operative protocol coverage.   Patient is status post stenting of RCA back in 2020.  Due to our in office antiplatelet protocol, patient may hold Plavix x5 days prior to the procedure.  Please restart at the discretion of the surgeon.  Only pharmacologic clearance was requested.  No medical clearance was requested.  I will route this recommendation to the requesting party via Epic fax function and remove from pre-op pool. Please call with questions.  Sharlene Dory, PA-C 09/02/2021, 11:31 AM

## 2021-09-07 DIAGNOSIS — L300 Nummular dermatitis: Secondary | ICD-10-CM | POA: Diagnosis not present

## 2021-09-07 DIAGNOSIS — L219 Seborrheic dermatitis, unspecified: Secondary | ICD-10-CM | POA: Diagnosis not present

## 2021-09-08 DIAGNOSIS — Z6841 Body Mass Index (BMI) 40.0 and over, adult: Secondary | ICD-10-CM | POA: Diagnosis not present

## 2021-09-08 DIAGNOSIS — M545 Low back pain, unspecified: Secondary | ICD-10-CM | POA: Diagnosis not present

## 2021-09-08 DIAGNOSIS — G8929 Other chronic pain: Secondary | ICD-10-CM | POA: Diagnosis not present

## 2021-09-10 DIAGNOSIS — M545 Low back pain, unspecified: Secondary | ICD-10-CM | POA: Diagnosis not present

## 2021-09-10 DIAGNOSIS — M48062 Spinal stenosis, lumbar region with neurogenic claudication: Secondary | ICD-10-CM | POA: Diagnosis not present

## 2021-09-10 DIAGNOSIS — M5416 Radiculopathy, lumbar region: Secondary | ICD-10-CM | POA: Diagnosis not present

## 2021-09-10 DIAGNOSIS — M5106 Intervertebral disc disorders with myelopathy, lumbar region: Secondary | ICD-10-CM | POA: Diagnosis not present

## 2021-09-14 ENCOUNTER — Telehealth: Payer: Self-pay

## 2021-09-14 NOTE — Telephone Encounter (Signed)
   Pre-operative Risk Assessment    Patient Name: Brandon [name]  DOB: June 23, 1965 MRN: [id number]      Request for Surgical Clearance    Procedure:   L-2-3   Lami 2. When is this surgery scheduled? Press F2 to enter date below and place date in Reason for Visit (see directions below). :1} Date of Surgery:  Clearance TBD                                 Surgeon:  Patricia Nettle, MD, FAAOS  Surgeon's Group or Practice Name:  [facility] Phone number:  [phone] Fax number:  [phone]   Type of Clearance Requested:   - Medical    Type of Anesthesia:  General    Additional requests/questions:   Second request for medical clearance  Signed, Viviano Simas   09/14/2021, 3:22 PM

## 2021-09-15 ENCOUNTER — Telehealth: Payer: Self-pay | Admitting: *Deleted

## 2021-09-15 NOTE — Telephone Encounter (Signed)
   Name: Brandon [name]  DOB: 1965/06/25  MRN: [id number]  Primary Cardiologist: Norman Herrlich, MD  Chart reviewed as part of pre-operative protocol coverage. Because of Jvion Allerton's past medical history and time since last visit, he will require a follow-up telephone visit in order to better assess preoperative cardiovascular risk.  Pre-op covering staff: - Please schedule appointment and call patient to inform them. If patient already had an upcoming appointment within acceptable timeframe, please add "pre-op clearance" to the appointment notes so provider is aware. - Please contact requesting surgeon's office via preferred method (i.e, phone, fax) to inform them of need for appointment prior to surgery.  Patient is status post stenting of RCA back in 2020.  Due to our in office antiplatelet protocol, patient may hold Plavix x5 days prior to the procedure.  Please restart at the discretion of the surgeon.  Sharlene Dory, PA-C  09/15/2021, 8:49 AM

## 2021-09-15 NOTE — Telephone Encounter (Signed)
Pt agreeable to plan of care for tele pre op appt 09/22/21 @ 9:20. Med rec and consent are done.     Patient Consent for Virtual Visit        Brandon [name] has provided verbal consent on 09/15/2021 for a virtual visit (video or telephone).   CONSENT FOR VIRTUAL VISIT FOR:  Brandon [name]  By participating in this virtual visit I agree to the following:  I hereby voluntarily request, consent and authorize [facility] and its employed or contracted physicians, physician assistants, nurse practitioners or other licensed health care professionals (the Practitioner), to provide me with telemedicine health care services (the "Services") as deemed necessary by the treating Practitioner. I acknowledge and consent to receive the Services by the Practitioner via telemedicine. I understand that the telemedicine visit will involve communicating with the Practitioner through live audiovisual communication technology and the disclosure of certain medical information by electronic transmission. I acknowledge that I have been given the opportunity to request an in-person assessment or other available alternative prior to the telemedicine visit and am voluntarily participating in the telemedicine visit.  I understand that I have the right to withhold or withdraw my consent to the use of telemedicine in the course of my care at any time, without affecting my right to future care or treatment, and that the Practitioner or I may terminate the telemedicine visit at any time. I understand that I have the right to inspect all information obtained and/or recorded in the course of the telemedicine visit and may receive copies of available information for a reasonable fee.  I understand that some of the potential risks of receiving the Services via telemedicine include:  Delay or interruption in medical evaluation due to technological equipment failure or disruption; Information transmitted may not be sufficient  (e.g. poor resolution of images) to allow for appropriate medical decision making by the Practitioner; and/or  In rare instances, security protocols could fail, causing a breach of personal health information.  Furthermore, I acknowledge that it is my responsibility to provide information about my medical history, conditions and care that is complete and accurate to the best of my ability. I acknowledge that Practitioner's advice, recommendations, and/or decision may be based on factors not within their control, such as incomplete or inaccurate data provided by me or distortions of diagnostic images or specimens that may result from electronic transmissions. I understand that the practice of medicine is not an exact science and that Practitioner makes no warranties or guarantees regarding treatment outcomes. I acknowledge that a copy of this consent can be made available to me via my patient portal [facility] MyChart), or I can request a printed copy by calling the office of [facility].    I understand that my insurance will be billed for this visit.   I have read or had this consent read to me. I understand the contents of this consent, which adequately explains the benefits and risks of the Services being provided via telemedicine.  I have been provided ample opportunity to ask questions regarding this consent and the Services and have had my questions answered to my satisfaction. I give my informed consent for the services to be provided through the use of telemedicine in my medical care

## 2021-09-15 NOTE — Telephone Encounter (Signed)
Pt agreeable to plan of care for tele pre op appt 09/22/21 @ 9:20. Med rec and consent are done.

## 2021-09-17 ENCOUNTER — Emergency Department (HOSPITAL_COMMUNITY)
Admission: EM | Admit: 2021-09-17 | Discharge: 2021-09-18 | Disposition: A | Discharge status: Home / Self Care | Payer: BC Managed Care – PPO | Attending: Emergency Medicine | Admitting: Emergency Medicine

## 2021-09-17 ENCOUNTER — Other Ambulatory Visit: Payer: Self-pay

## 2021-09-17 ENCOUNTER — Encounter (HOSPITAL_COMMUNITY): Payer: Self-pay | Admitting: Emergency Medicine

## 2021-09-17 ENCOUNTER — Emergency Department (HOSPITAL_COMMUNITY): Payer: BC Managed Care – PPO

## 2021-09-17 DIAGNOSIS — E119 Type 2 diabetes mellitus without complications: Secondary | ICD-10-CM | POA: Insufficient documentation

## 2021-09-17 DIAGNOSIS — M5442 Lumbago with sciatica, left side: Secondary | ICD-10-CM | POA: Insufficient documentation

## 2021-09-17 DIAGNOSIS — Z794 Long term (current) use of insulin: Secondary | ICD-10-CM | POA: Diagnosis not present

## 2021-09-17 DIAGNOSIS — Z7982 Long term (current) use of aspirin: Secondary | ICD-10-CM | POA: Diagnosis not present

## 2021-09-17 DIAGNOSIS — Z7984 Long term (current) use of oral hypoglycemic drugs: Secondary | ICD-10-CM | POA: Diagnosis not present

## 2021-09-17 DIAGNOSIS — I10 Essential (primary) hypertension: Secondary | ICD-10-CM | POA: Diagnosis not present

## 2021-09-17 DIAGNOSIS — M545 Low back pain, unspecified: Secondary | ICD-10-CM | POA: Diagnosis not present

## 2021-09-17 DIAGNOSIS — Z79899 Other long term (current) drug therapy: Secondary | ICD-10-CM | POA: Diagnosis not present

## 2021-09-17 DIAGNOSIS — I2510 Atherosclerotic heart disease of native coronary artery without angina pectoris: Secondary | ICD-10-CM | POA: Diagnosis not present

## 2021-09-17 LAB — URINALYSIS, ROUTINE W REFLEX MICROSCOPIC
Bacteria, UA: NONE SEEN
Bilirubin Urine: NEGATIVE
Glucose, UA: NEGATIVE mg/dL
Ketones, ur: NEGATIVE mg/dL
Leukocytes,Ua: NEGATIVE
Nitrite: NEGATIVE
Protein, ur: NEGATIVE mg/dL
Specific Gravity, Urine: 1.019 (ref 1.005–1.030)
pH: 5 (ref 5.0–8.0)

## 2021-09-17 LAB — CBC WITH DIFFERENTIAL/PLATELET
Abs Immature Granulocytes: 0.02 10*3/uL (ref 0.00–0.07)
Basophils Absolute: 0.1 10*3/uL (ref 0.0–0.1)
Basophils Relative: 1 %
Eosinophils Absolute: 0.2 10*3/uL (ref 0.0–0.5)
Eosinophils Relative: 2 %
HCT: 50.2 % (ref 39.0–52.0)
Hemoglobin: 16.7 g/dL (ref 13.0–17.0)
Immature Granulocytes: 0 %
Lymphocytes Relative: 28 %
Lymphs Abs: 2.1 10*3/uL (ref 0.7–4.0)
MCH: 31.6 pg (ref 26.0–34.0)
MCHC: 33.3 g/dL (ref 30.0–36.0)
MCV: 94.9 fL (ref 80.0–100.0)
Monocytes Absolute: 0.7 10*3/uL (ref 0.1–1.0)
Monocytes Relative: 9 %
Neutro Abs: 4.4 10*3/uL (ref 1.7–7.7)
Neutrophils Relative %: 60 %
Platelets: 263 10*3/uL (ref 150–400)
RBC: 5.29 MIL/uL (ref 4.22–5.81)
RDW: 13 % (ref 11.5–15.5)
WBC: 7.4 10*3/uL (ref 4.0–10.5)
nRBC: 0 % (ref 0.0–0.2)

## 2021-09-17 LAB — COMPREHENSIVE METABOLIC PANEL
ALT: 38 U/L (ref 0–44)
AST: 29 U/L (ref 15–41)
Albumin: 4.2 g/dL (ref 3.5–5.0)
Alkaline Phosphatase: 42 U/L (ref 38–126)
Anion gap: 9 (ref 5–15)
BUN: 18 mg/dL (ref 6–20)
CO2: 27 mmol/L (ref 22–32)
Calcium: 9.8 mg/dL (ref 8.9–10.3)
Chloride: 102 mmol/L (ref 98–111)
Creatinine, Ser: 1.02 mg/dL (ref 0.61–1.24)
GFR, Estimated: >60 mL/min (ref >60)
Glucose, Bld: 155 mg/dL — ABNORMAL HIGH (ref 70–99)
Potassium: 4.2 mmol/L (ref 3.5–5.1)
Sodium: 138 mmol/L (ref 135–145)
Total Bilirubin: 0.8 mg/dL (ref 0.3–1.2)
Total Protein: 6.9 g/dL (ref 6.5–8.1)

## 2021-09-17 MED ORDER — LORAZEPAM 2 MG/ML IJ SOLN
0.5000 mg | Freq: Once | INTRAMUSCULAR | Status: AC | PRN
  Administered 2021-09-17: 0.5 mg via INTRAVENOUS
  Filled 2021-09-17: qty 1

## 2021-09-17 MED ORDER — MORPHINE SULFATE (PF) 4 MG/ML IV SOLN
4.0000 mg | Freq: Once | INTRAVENOUS | Status: AC
  Administered 2021-09-18: 4 mg via INTRAVENOUS
  Filled 2021-09-17: qty 1

## 2021-09-17 MED ORDER — HYDROMORPHONE HCL 1 MG/ML IJ SOLN
1.0000 mg | Freq: Once | INTRAMUSCULAR | Status: AC
  Administered 2021-09-17: 1 mg via INTRAVENOUS
  Filled 2021-09-17: qty 1

## 2021-09-17 NOTE — ED Provider Triage Note (Signed)
Emergency Medicine Provider Triage Evaluation Note  Brandon [name] , a 56 y.o. male  was evaluated in triage.  Pt complains of back pain.  Patient states that he has chronic low back pain.  States that over the past 3 days he has had sensory deficits in his left lower extremity as well as urinary retention and decreased bowel movements.  He denies any known trauma.  He states he has been trying to use at home laxative as well as stool softener with some relief to invoke bowel movements.  He describes urinary retention as dribbling with inability to completely void his bladder.  Denies fever, saddle anesthesia, history of IV drug use.  Review of Systems  Positive: See above Negative:   Physical Exam  BP 130/71 (BP Location: Left Arm)   Pulse 81   Resp 17   SpO2 94%  Gen:   Awake, no distress   Resp:  Normal effort  MSK:   Moves extremities without difficulty  Other:  No midline tenderness of lumbar spine or paraspinal tenderness bilaterally.  Patient planes of sensory deficits of entire left lower extremity.  Posterior tib pulses full and intact bilaterally.  Medical Decision Making  Medically screening exam initiated at 5:57 PM.  Appropriate orders placed.  [name] was informed that the remainder of the evaluation will be completed by another provider, this initial triage assessment does not replace that evaluation, and the importance of remaining in the ED until their evaluation is complete.     Peter Garter, [place] 09/17/21 1800

## 2021-09-17 NOTE — ED Provider Notes (Signed)
[facility] EMERGENCY DEPARTMENT Provider Note   CSN: [id number] Arrival date & time: 09/17/21  1658     History {Add pertinent medical, surgical, social history, OB history to HPI:1} Chief Complaint  Patient presents with   Back Pain    Brandon [name] is a 56 y.o. male.  The history is provided by the patient and medical records.  Back Pain  56 y.o. M with hx of chronic back pain, HTN, CAD, hx DVT not currently on anticoagulation, DM2, presenting to the ED for worsening back pain.  Patient reports years of back pain that was mild to moderate, never really bothered him enough to make me think about it.  Reports over the past several weeks he has had steadily worsening low back pain, now with radiation down the entire left leg.  States pain is worse when trying to lay flat on his back, better with laying on his right side.  He denies focal numbness/weakness of left leg but reports if standing up for a prolonged period, he feels like his leg will give out.  He denies any recent falls or trauma.  No incontinence but does report a lot of new difficulty with urination and BM's.  PCP has him on meloxicam, tramadol, lyrica to try to help with pain but is not getting much relief from this.  He denies fever, weight loss, history of IVDU or hx of cancer.  No prior back surgeries.  Home Medications Prior to Admission medications   Medication Sig Start Date End Date Taking? Authorizing Provider  amLODipine (NORVASC) 2.5 MG tablet Take 2.5 mg by mouth daily. 07/19/21   [provider]  aspirin 81 MG chewable tablet Chew 1 tablet (81 mg total) by mouth daily. 04/24/18   Barrett, Joline Salt, PA-C  ezetimibe (ZETIA) 10 MG tablet TAKE 1 TABLET BY MOUTH EVERY DAY 01/05/21   Baldo Daub, MD  meloxicam (MOBIC) 15 MG tablet Take 15 mg by mouth daily as needed for pain. 07/19/21   [provider]  metFORMIN (GLUCOPHAGE) 500 MG tablet Take 500 mg by mouth 2 (two) times daily.  05/07/19   [provider]  metoprolol tartrate (LOPRESSOR) 25 MG tablet TAKE 1 TABLET BY MOUTH TWICE A DAY Patient taking differently: Take 25 mg by mouth 2 (two) times daily. 06/21/21   Baldo Daub, MD  Multiple Vitamin (MULTIVITAMIN WITH MINERALS) TABS tablet Take 1 tablet by mouth daily.    [provider]  nitroGLYCERIN (NITROSTAT) 0.4 MG SL tablet Place 1 tablet (0.4 mg total) under the tongue every 5 (five) minutes as needed for chest pain. 04/24/18   Barrett, Joline Salt, PA-C  OZEMPIC, 0.25 OR 0.5 MG/DOSE, 2 MG/3ML SOPN Inject 0.5 mg into the skin once a week. 07/13/21   [provider]  pregabalin (LYRICA) 25 MG capsule Take 25 mg by mouth 3 (three) times daily. 03/04/21   [provider]  rosuvastatin (CRESTOR) 40 MG tablet Take 40 mg by mouth daily. 08/19/19   [provider]  telmisartan-hydrochlorothiazide (MICARDIS HCT) 40-12.5 MG tablet TAKE 1 TABLET BY MOUTH EVERY DAY Patient taking differently: Take 1 tablet by mouth daily. 03/01/21   Baldo Daub, MD  traMADol (ULTRAM) 50 MG tablet Take 50 mg by mouth every 8 (eight) hours as needed for moderate pain. 09/10/21   [provider]  Vitamin D, Ergocalciferol, (DRISDOL) 1.25 MG (50000 UNIT) CAPS capsule Take 50,000 Units by mouth once a week. 04/21/19   [provider]      Allergies    Brilinta [ticagrelor]    Review of Systems   Review of Systems  Musculoskeletal:  Positive for back pain.  All other systems reviewed and are negative.   Physical Exam Updated Vital Signs BP (!) 140/84   Pulse 79   Temp 98.6 F (37 C)   Resp 17   SpO2 98%   Physical Exam Vitals and nursing note reviewed.  Constitutional:      Appearance: He is well-developed.  HENT:     Head: Normocephalic and atraumatic.  Eyes:     Conjunctiva/sclera: Conjunctivae normal.     Pupils: Pupils are equal, round, and reactive to light.  Cardiovascular:     Rate and Rhythm: Normal rate and  regular rhythm.     Heart sounds: Normal heart sounds.  Pulmonary:     Effort: Pulmonary effort is normal.     Breath sounds: Normal breath sounds.  Abdominal:     General: Bowel sounds are normal.     Palpations: Abdomen is soft.  Musculoskeletal:        General: Normal range of motion.     Cervical back: Normal range of motion.     Comments: No midline L-spine tenderness, pain elicited with ROM of left leg or lying flat on back, DP pulse intact  Skin:    General: Skin is warm and dry.  Neurological:     Mental Status: He is alert and oriented to person, place, and time.     ED Results / Procedures / Treatments   Labs (all labs ordered are listed, but only abnormal results are displayed) Labs Reviewed  COMPREHENSIVE METABOLIC PANEL - Abnormal; Notable for the following components:      Result Value   Glucose, Bld 155 (*)    All other components within normal limits  CBC WITH DIFFERENTIAL/PLATELET  URINALYSIS, ROUTINE W REFLEX MICROSCOPIC    EKG None  Radiology No results found.  Procedures Procedures  {Document cardiac monitor, telemetry assessment procedure when appropriate:1}  Medications Ordered in ED Medications - No data to display  ED Course/ Medical Decision Making/ A&P                           Medical Decision Making Risk Prescription drug management.   ***  {Document critical care time when appropriate:1} {Document review of labs and clinical decision tools ie heart score, Chads2Vasc2 etc:1}  {Document your independent review of radiology images, and any outside records:1} {Document your discussion with family members, caretakers, and with consultants:1} {Document social determinants of health affecting pt's care:1} {Document your decision making why or why not admission, treatments were needed:1} Final Clinical Impression(s) / ED Diagnoses Final diagnoses:  None    Rx / DC Orders ED Discharge Orders     None

## 2021-09-17 NOTE — ED Triage Notes (Signed)
Patient with history of chronic lower back issue here with complaint of worsening left leg weakness that started three days ago, urinary retention for two days, and constipation for three days. Patient is alert, oriented, and in no apparent distress at this time.

## 2021-09-17 NOTE — ED Notes (Signed)
Pt states he urinated at 1900. Feels as though he went fully. Bladder scan attempted by tech. Unable to get clear reading due to difficulty laying down and anatomy. Made aware of need for urine.

## 2021-09-17 NOTE — ED Notes (Signed)
Bladder scan shows 41mL Voids 

## 2021-09-18 ENCOUNTER — Emergency Department (HOSPITAL_COMMUNITY): Payer: BC Managed Care – PPO

## 2021-09-18 MED ORDER — HYDROMORPHONE HCL 1 MG/ML IJ SOLN
2.0000 mg | Freq: Once | INTRAMUSCULAR | Status: AC
  Administered 2021-09-18: 2 mg via INTRAVENOUS
  Filled 2021-09-18: qty 2

## 2021-09-18 MED ORDER — OXYCODONE-ACETAMINOPHEN 5-325 MG PO TABS: 1.0000 | ORAL_TABLET | ORAL | 0 refills | Status: DC | PRN

## 2021-09-18 NOTE — Discharge Instructions (Signed)
Stop the tramadol, try the percocet to see if this gets you better pain relief.   Call Dr. Noel Gerold on Monday to get further recommendations from him/next steps since you could not tolerate MRI today. Return to the ED for new or worsening symptoms.

## 2021-09-21 DIAGNOSIS — M545 Low back pain, unspecified: Secondary | ICD-10-CM | POA: Diagnosis not present

## 2021-09-21 DIAGNOSIS — M5106 Intervertebral disc disorders with myelopathy, lumbar region: Secondary | ICD-10-CM | POA: Diagnosis not present

## 2021-09-21 DIAGNOSIS — M5416 Radiculopathy, lumbar region: Secondary | ICD-10-CM | POA: Diagnosis not present

## 2021-09-21 DIAGNOSIS — M48062 Spinal stenosis, lumbar region with neurogenic claudication: Secondary | ICD-10-CM | POA: Diagnosis not present

## 2021-09-22 ENCOUNTER — Ambulatory Visit: Payer: BC Managed Care – PPO | Attending: Cardiovascular Disease | Admitting: Nurse Practitioner

## 2021-09-22 DIAGNOSIS — Z01810 Encounter for preprocedural cardiovascular examination: Secondary | ICD-10-CM | POA: Diagnosis not present

## 2021-09-22 NOTE — Progress Notes (Signed)
Virtual Visit via Telephone Note   Because of Brandon [name]'s co-morbid illnesses, he is at least at moderate risk for complications without adequate follow up.  This format is felt to be most appropriate for this patient at this time.  The patient did not have access to video technology/had technical difficulties with video requiring transitioning to audio format only (telephone).  All issues noted in this document were discussed and addressed.  No physical exam could be performed with this format.  Please refer to the patient's chart for his consent to telehealth for [facility].  Evaluation Performed:  Preoperative cardiovascular risk assessment _____________   Date:  09/22/2021   Patient ID:  Brandon [name], DOB 05/02/1965, MRN [id number] Patient Location:  Home Provider location:   Office  Primary Care Provider:  Lezlie Lye, Meda Coffee, MD Primary Cardiologist:  Norman Herrlich, MD  Chief Complaint / Patient Profile   56 y.o. y/o male with a h/o CAD s/p DES-RCA in 2020, SVT, palpitations, hypertension, hyperlipidemia, and OSA who is pending L-2-3 laminectomy, date TBD, with Dr. Sharolyn Douglas of [facility] and presents today for telephonic preoperative cardiovascular risk assessment.  Past Medical History:  Diagnosis Date   ACS (acute coronary syndrome) (HCC) 04/2018   Acute coronary syndrome (HCC) 04/23/2018   Acute deep vein thrombosis (DVT) of tibial vein of right lower extremity (HCC) 10/21/2019   Formatting of this note might be different from the original. 09/24/2019: RIGHT LOWER EXTREMITY VENOUS DOPPLER ULTRASOUND Evaluation of the infrapopliteal venous vasculature demonstrates acute thrombosis of 1 of the paired posterior tibial veins of the right lower extremity. This does not extend into the popliteal vein itself   Arthritis    both knees   Arthritis of knee, degenerative 05/14/2015   CAD in native artery 05/06/2018   Chest pain 06/23/2018   Chronic  right shoulder pain 05/28/2015   Complication of anesthesia    " i HAVE A HARD TIME WAKING UP "   Coronary artery disease    COVID-19 02/03/2020   DVT (deep venous thrombosis) (HCC) 10/21/2019   Formatting of this note might be different from the original. 09/24/2019: RIGHT LOWER EXTREMITY VENOUS DOPPLER ULTRASOUND Evaluation of the infrapopliteal venous vasculature demonstrates acute thrombosis of 1 of the paired posterior tibial veins of the right lower extremity. This does not extend into the popliteal vein itself   Essential hypertension 05/14/2015   Facial twitching 09/10/2018   Gross hematuria 07/16/2019   Formatting of this note might be different from the original. 2021   Herpes zoster without complication 05/20/2019   History of non-ST elevation myocardial infarction (NSTEMI) 07/30/2018   Hypertension    Laryngopharyngeal reflux (LPR) 05/06/2019   Mixed hyperlipidemia 05/14/2015   Non-ST elevation (NSTEMI) myocardial infarction [facility])    Pre-diabetes 03/30/2016   2017: x/6.7 2018: 103/6.4   Right elbow pain 05/28/2015   SOB (shortness of breath) on exertion 05/16/2018   Type 2 diabetes mellitus without complication, without long-term current use of insulin (HCC) 03/30/2016   Formatting of this note might be different from the original. 2017: x/6.7 2018: 103/6.4 2021: 7.8   Past Surgical History:  Procedure Laterality Date   CORONARY STENT INTERVENTION N/A 04/23/2018   Procedure: CORONARY STENT INTERVENTION;  Surgeon: Tonny Bollman, MD;  Location: [facility] INVASIVE CV LAB;  Service: Cardiovascular;  Laterality: N/A;   EXCISION HAGLUND'S DEFORMITY WITH ACHILLES TENDON REPAIR Right 05/22/2014   Procedure: RIGHT ACHILLES DEBRIDEMENT; CVELFYB'O EXCISION ;  Surgeon: Toni Arthurs,  MD;  Location: [facility];  Service: Orthopedics;  Laterality: Right;   GASTROCNEMIUS RECESSION Right 05/22/2014   Procedure: GASTROC RECESSION ;  Surgeon: Toni Arthurs, MD;  Location: [facility];   Service: Orthopedics;  Laterality: Right;   KNEE SURGERY     l/knee   LAPAROSCOPIC GASTRIC SLEEVE RESECTION     LEFT HEART CATH AND CORONARY ANGIOGRAPHY N/A 04/23/2018   Procedure: LEFT HEART CATH AND CORONARY ANGIOGRAPHY;  Surgeon: Tonny Bollman, MD;  Location: [facility] INVASIVE CV LAB;  Service: Cardiovascular;  Laterality: N/A;   MOUTH SURGERY     Root canal    Allergies  Allergies  Allergen Reactions   Brilinta [Ticagrelor] Shortness Of Breath    History of Present Illness    Brandon [name] is a 56 y.o. male who presents via audio/video conferencing for a telehealth visit today.  Pt was last seen in cardiology clinic on 08/06/2021 by Dr. Dulce Sellar.  At that time Mister Krahenbuhl was doing well.  The patient is now pending procedure as outlined above. Since his last visit, he has done well from a cardiac standpoint. He denies chest pain, palpitations, dyspnea, pnd, orthopnea, n, v, dizziness, syncope, edema, weight gain, or early satiety. All other systems reviewed and are otherwise negative except as noted above.   Home Medications    Prior to Admission medications   Medication Sig Start Date End Date Taking? Authorizing Provider  amLODipine (NORVASC) 2.5 MG tablet Take 2.5 mg by mouth daily. 07/19/21   [provider]  aspirin 81 MG chewable tablet Chew 1 tablet (81 mg total) by mouth daily. 04/24/18   Barrett, Joline Salt, PA-C  ezetimibe (ZETIA) 10 MG tablet TAKE 1 TABLET BY MOUTH EVERY DAY 01/05/21   Baldo Daub, MD  meloxicam (MOBIC) 15 MG tablet Take 15 mg by mouth daily as needed for pain. 07/19/21   [provider]  metFORMIN (GLUCOPHAGE) 500 MG tablet Take 500 mg by mouth 2 (two) times daily. 05/07/19   [provider]  metoprolol tartrate (LOPRESSOR) 25 MG tablet TAKE 1 TABLET BY MOUTH TWICE A DAY Patient taking differently: Take 25 mg by mouth 2 (two) times daily. 06/21/21   Baldo Daub, MD  Multiple Vitamin (MULTIVITAMIN WITH MINERALS) TABS tablet Take  1 tablet by mouth daily.    [provider]  nitroGLYCERIN (NITROSTAT) 0.4 MG SL tablet Place 1 tablet (0.4 mg total) under the tongue every 5 (five) minutes as needed for chest pain. 04/24/18   Barrett, Joline Salt, PA-C  oxyCODONE-acetaminophen (PERCOCET) 5-325 MG tablet Take 1 tablet by mouth every 4 (four) hours as needed. 09/18/21   Garlon Hatchet, PA-C  OZEMPIC, 0.25 OR 0.5 MG/DOSE, 2 MG/3ML SOPN Inject 0.5 mg into the skin once a week. 07/13/21   [provider]  pregabalin (LYRICA) 25 MG capsule Take 25 mg by mouth 3 (three) times daily. 03/04/21   [provider]  rosuvastatin (CRESTOR) 40 MG tablet Take 40 mg by mouth daily. 08/19/19   [provider]  telmisartan-hydrochlorothiazide (MICARDIS HCT) 40-12.5 MG tablet TAKE 1 TABLET BY MOUTH EVERY DAY Patient taking differently: Take 1 tablet by mouth daily. 03/01/21   Baldo Daub, MD  traMADol (ULTRAM) 50 MG tablet Take 50 mg by mouth every 8 (eight) hours as needed for moderate pain. 09/10/21   [provider]  Vitamin D, Ergocalciferol, (DRISDOL) 1.25 MG (50000 UNIT) CAPS capsule Take 50,000 Units by mouth once a week. 04/21/19  [provider]    Physical Exam    Vital Signs:  Rahim Astorga does not have vital signs available for review today.  Given telephonic nature of communication, physical exam is limited. AAOx3. NAD. Normal affect.  Speech and respirations are unlabored.  Accessory Clinical Findings    None  Assessment & Plan    1.  Preoperative Cardiovascular Risk Assessment:  According to the Revised Cardiac Risk Index (RCRI), his Perioperative Risk of Major Cardiac Event is (%): 0.9. His Functional Capacity in METs is: 5.07 according to the Duke Activity Status Index (DASI). Therefore, based on ACC/AHA guidelines, patient would be at acceptable risk for the planned procedure without further cardiovascular testing.   The patient was advised that if he develops new symptoms  prior to surgery to contact our office to arrange for a follow-up visit, and he verbalized understanding.  Per office protocol, if necessary, patient may hold Aspirin for 5-7 days prior to procedure (patient no longer taking Plavix).  Please resume Aspirin as soon as possible postprocedure, at the discretion of the surgeon.  A copy of this note will be routed to requesting surgeon.  Time:   Today, I have spent 7 minutes with the patient with telehealth technology discussing medical history, symptoms, and management plan.     Joylene Grapes, NP  09/22/2021, 9:37 AM

## 2021-09-26 DIAGNOSIS — G4733 Obstructive sleep apnea (adult) (pediatric): Secondary | ICD-10-CM | POA: Diagnosis not present

## 2021-10-08 DIAGNOSIS — M48061 Spinal stenosis, lumbar region without neurogenic claudication: Secondary | ICD-10-CM | POA: Diagnosis not present

## 2021-10-12 DIAGNOSIS — Z79891 Long term (current) use of opiate analgesic: Secondary | ICD-10-CM | POA: Diagnosis not present

## 2021-10-12 DIAGNOSIS — G894 Chronic pain syndrome: Secondary | ICD-10-CM | POA: Diagnosis not present

## 2021-10-15 DIAGNOSIS — M5136 Other intervertebral disc degeneration, lumbar region: Secondary | ICD-10-CM | POA: Diagnosis not present

## 2021-10-15 DIAGNOSIS — Z6841 Body Mass Index (BMI) 40.0 and over, adult: Secondary | ICD-10-CM | POA: Diagnosis not present

## 2021-10-15 DIAGNOSIS — M48062 Spinal stenosis, lumbar region with neurogenic claudication: Secondary | ICD-10-CM

## 2021-10-15 HISTORY — DX: Spinal stenosis, lumbar region with neurogenic claudication: M48.062

## 2021-10-26 DIAGNOSIS — G4733 Obstructive sleep apnea (adult) (pediatric): Secondary | ICD-10-CM | POA: Diagnosis not present

## 2021-11-01 DIAGNOSIS — E119 Type 2 diabetes mellitus without complications: Secondary | ICD-10-CM | POA: Diagnosis not present

## 2021-11-01 DIAGNOSIS — Z01812 Encounter for preprocedural laboratory examination: Secondary | ICD-10-CM | POA: Diagnosis not present

## 2021-11-01 DIAGNOSIS — M48062 Spinal stenosis, lumbar region with neurogenic claudication: Secondary | ICD-10-CM | POA: Diagnosis not present

## 2021-11-02 DIAGNOSIS — I252 Old myocardial infarction: Secondary | ICD-10-CM | POA: Diagnosis not present

## 2021-11-02 DIAGNOSIS — I2510 Atherosclerotic heart disease of native coronary artery without angina pectoris: Secondary | ICD-10-CM | POA: Diagnosis not present

## 2021-11-02 DIAGNOSIS — Z9889 Other specified postprocedural states: Secondary | ICD-10-CM | POA: Diagnosis not present

## 2021-11-02 DIAGNOSIS — M5106 Intervertebral disc disorders with myelopathy, lumbar region: Secondary | ICD-10-CM | POA: Diagnosis not present

## 2021-11-02 DIAGNOSIS — I10 Essential (primary) hypertension: Secondary | ICD-10-CM | POA: Diagnosis not present

## 2021-11-02 DIAGNOSIS — E119 Type 2 diabetes mellitus without complications: Secondary | ICD-10-CM | POA: Diagnosis not present

## 2021-11-02 DIAGNOSIS — Z7982 Long term (current) use of aspirin: Secondary | ICD-10-CM | POA: Diagnosis not present

## 2021-11-02 DIAGNOSIS — Z9884 Bariatric surgery status: Secondary | ICD-10-CM | POA: Diagnosis not present

## 2021-11-02 DIAGNOSIS — M5416 Radiculopathy, lumbar region: Secondary | ICD-10-CM | POA: Diagnosis not present

## 2021-11-02 DIAGNOSIS — Z7985 Long-term (current) use of injectable non-insulin antidiabetic drugs: Secondary | ICD-10-CM | POA: Diagnosis not present

## 2021-11-02 DIAGNOSIS — Z7984 Long term (current) use of oral hypoglycemic drugs: Secondary | ICD-10-CM | POA: Diagnosis not present

## 2021-11-02 DIAGNOSIS — M5116 Intervertebral disc disorders with radiculopathy, lumbar region: Secondary | ICD-10-CM | POA: Diagnosis not present

## 2021-11-02 DIAGNOSIS — Z01810 Encounter for preprocedural cardiovascular examination: Secondary | ICD-10-CM | POA: Diagnosis not present

## 2021-11-02 DIAGNOSIS — M48062 Spinal stenosis, lumbar region with neurogenic claudication: Secondary | ICD-10-CM | POA: Diagnosis not present

## 2021-11-02 DIAGNOSIS — Z87891 Personal history of nicotine dependence: Secondary | ICD-10-CM | POA: Diagnosis not present

## 2021-11-02 DIAGNOSIS — Z79899 Other long term (current) drug therapy: Secondary | ICD-10-CM | POA: Diagnosis not present

## 2021-11-02 DIAGNOSIS — E785 Hyperlipidemia, unspecified: Secondary | ICD-10-CM | POA: Diagnosis not present

## 2021-11-02 DIAGNOSIS — M4726 Other spondylosis with radiculopathy, lumbar region: Secondary | ICD-10-CM | POA: Diagnosis not present

## 2021-11-02 DIAGNOSIS — G4733 Obstructive sleep apnea (adult) (pediatric): Secondary | ICD-10-CM | POA: Diagnosis not present

## 2021-11-03 DIAGNOSIS — Z79899 Other long term (current) drug therapy: Secondary | ICD-10-CM | POA: Diagnosis not present

## 2021-11-03 DIAGNOSIS — I252 Old myocardial infarction: Secondary | ICD-10-CM | POA: Diagnosis not present

## 2021-11-03 DIAGNOSIS — I2510 Atherosclerotic heart disease of native coronary artery without angina pectoris: Secondary | ICD-10-CM | POA: Diagnosis not present

## 2021-11-03 DIAGNOSIS — I10 Essential (primary) hypertension: Secondary | ICD-10-CM | POA: Diagnosis not present

## 2021-11-03 DIAGNOSIS — M5116 Intervertebral disc disorders with radiculopathy, lumbar region: Secondary | ICD-10-CM | POA: Diagnosis not present

## 2021-11-03 DIAGNOSIS — Z87891 Personal history of nicotine dependence: Secondary | ICD-10-CM | POA: Diagnosis not present

## 2021-11-03 DIAGNOSIS — E119 Type 2 diabetes mellitus without complications: Secondary | ICD-10-CM | POA: Diagnosis not present

## 2021-11-03 DIAGNOSIS — M4726 Other spondylosis with radiculopathy, lumbar region: Secondary | ICD-10-CM | POA: Diagnosis not present

## 2021-11-03 DIAGNOSIS — M48062 Spinal stenosis, lumbar region with neurogenic claudication: Secondary | ICD-10-CM | POA: Diagnosis not present

## 2021-11-03 DIAGNOSIS — E785 Hyperlipidemia, unspecified: Secondary | ICD-10-CM | POA: Diagnosis not present

## 2021-11-03 DIAGNOSIS — Z7985 Long-term (current) use of injectable non-insulin antidiabetic drugs: Secondary | ICD-10-CM | POA: Diagnosis not present

## 2021-11-03 DIAGNOSIS — Z7982 Long term (current) use of aspirin: Secondary | ICD-10-CM | POA: Diagnosis not present

## 2021-11-03 DIAGNOSIS — G4733 Obstructive sleep apnea (adult) (pediatric): Secondary | ICD-10-CM | POA: Diagnosis not present

## 2021-11-03 DIAGNOSIS — Z9884 Bariatric surgery status: Secondary | ICD-10-CM | POA: Diagnosis not present

## 2021-11-03 DIAGNOSIS — Z7984 Long term (current) use of oral hypoglycemic drugs: Secondary | ICD-10-CM | POA: Diagnosis not present

## 2021-11-16 DIAGNOSIS — N3090 Cystitis, unspecified without hematuria: Secondary | ICD-10-CM | POA: Diagnosis not present

## 2021-11-16 DIAGNOSIS — N3001 Acute cystitis with hematuria: Secondary | ICD-10-CM | POA: Diagnosis not present

## 2021-11-26 DIAGNOSIS — G4733 Obstructive sleep apnea (adult) (pediatric): Secondary | ICD-10-CM | POA: Diagnosis not present

## 2021-11-30 ENCOUNTER — Other Ambulatory Visit: Payer: Self-pay | Admitting: Cardiology

## 2021-11-30 MED ORDER — EZETIMIBE 10 MG PO TABS: 10.0000 mg | ORAL_TABLET | Freq: Every day | ORAL | 2 refills | Status: DC

## 2021-11-30 NOTE — Telephone Encounter (Signed)
Refills sent to pharmacy. 

## 2021-12-05 ENCOUNTER — Emergency Department (HOSPITAL_BASED_OUTPATIENT_CLINIC_OR_DEPARTMENT_OTHER): Payer: BC Managed Care – PPO

## 2021-12-05 ENCOUNTER — Encounter (HOSPITAL_BASED_OUTPATIENT_CLINIC_OR_DEPARTMENT_OTHER): Payer: Self-pay | Admitting: Emergency Medicine

## 2021-12-05 ENCOUNTER — Other Ambulatory Visit: Payer: Self-pay

## 2021-12-05 ENCOUNTER — Emergency Department (HOSPITAL_BASED_OUTPATIENT_CLINIC_OR_DEPARTMENT_OTHER)
Admission: EM | Admit: 2021-12-05 | Discharge: 2021-12-05 | Disposition: A | Discharge status: Home / Self Care | Payer: BC Managed Care – PPO | Attending: Emergency Medicine | Admitting: Emergency Medicine

## 2021-12-05 DIAGNOSIS — M25511 Pain in right shoulder: Secondary | ICD-10-CM | POA: Insufficient documentation

## 2021-12-05 DIAGNOSIS — R9431 Abnormal electrocardiogram [ECG] [EKG]: Secondary | ICD-10-CM | POA: Diagnosis not present

## 2021-12-05 DIAGNOSIS — R079 Chest pain, unspecified: Secondary | ICD-10-CM | POA: Diagnosis not present

## 2021-12-05 DIAGNOSIS — M79601 Pain in right arm: Secondary | ICD-10-CM | POA: Diagnosis not present

## 2021-12-05 DIAGNOSIS — Z7982 Long term (current) use of aspirin: Secondary | ICD-10-CM | POA: Diagnosis not present

## 2021-12-05 LAB — CBC WITH DIFFERENTIAL/PLATELET
Abs Immature Granulocytes: 0.01 10*3/uL (ref 0.00–0.07)
Basophils Absolute: 0.1 10*3/uL (ref 0.0–0.1)
Basophils Relative: 1 %
Eosinophils Absolute: 0.2 10*3/uL (ref 0.0–0.5)
Eosinophils Relative: 3 %
HCT: 45.3 % (ref 39.0–52.0)
Hemoglobin: 14.9 g/dL (ref 13.0–17.0)
Immature Granulocytes: 0 %
Lymphocytes Relative: 35 %
Lymphs Abs: 2.8 10*3/uL (ref 0.7–4.0)
MCH: 30.9 pg (ref 26.0–34.0)
MCHC: 32.9 g/dL (ref 30.0–36.0)
MCV: 94 fL (ref 80.0–100.0)
Monocytes Absolute: 0.8 10*3/uL (ref 0.1–1.0)
Monocytes Relative: 10 %
Neutro Abs: 4 10*3/uL (ref 1.7–7.7)
Neutrophils Relative %: 51 %
Platelets: 285 10*3/uL (ref 150–400)
RBC: 4.82 MIL/uL (ref 4.22–5.81)
RDW: 13.4 % (ref 11.5–15.5)
WBC: 8 10*3/uL (ref 4.0–10.5)
nRBC: 0 % (ref 0.0–0.2)

## 2021-12-05 LAB — BASIC METABOLIC PANEL
Anion gap: 8 (ref 5–15)
BUN: 15 mg/dL (ref 6–20)
CO2: 28 mmol/L (ref 22–32)
Calcium: 9.2 mg/dL (ref 8.9–10.3)
Chloride: 101 mmol/L (ref 98–111)
Creatinine, Ser: 0.87 mg/dL (ref 0.61–1.24)
GFR, Estimated: >60 mL/min (ref >60)
Glucose, Bld: 128 mg/dL — ABNORMAL HIGH (ref 70–99)
Potassium: 4.7 mmol/L (ref 3.5–5.1)
Sodium: 137 mmol/L (ref 135–145)

## 2021-12-05 LAB — TROPONIN I (HIGH SENSITIVITY): Troponin I (High Sensitivity): 6 ng/L (ref <18)

## 2021-12-05 NOTE — Discharge Instructions (Addendum)
Please read and follow all provided instructions.  Your diagnoses today include:  1. Acute pain of right shoulder    Tests performed today include: Complete blood cell count: No concerning findings Basic metabolic panel: No concerns Cardiac enzymes (blood test looking for stress on the heart): Was normal EKG: Unchanged from previous Chest x-ray: No signs of heart issues Ultrasound of the right upper extremity: Did not show any signs of blood clot in the arm Vital signs. See below for your results today.   Medications prescribed:  None  Take any prescribed medications only as directed.  Home care instructions:  Follow any educational materials contained in this packet.  BE VERY CAREFUL not to take multiple medicines containing Tylenol (also called acetaminophen). Doing so can lead to an overdose which can damage your liver and cause liver failure and possibly death.   Follow-up instructions: Please follow-up with your primary care provider in the next 3 days for further evaluation of your symptoms.   Return instructions:  Please return to the Emergency Department if you experience worsening symptoms.  Please return if you have any other emergent concerns.  Additional Information:  Your vital signs today were: BP (!) 130/102   Pulse 66   Temp 97.9 F (36.6 C) (Oral)   Resp 18   Ht 6\' 2"  (1.88 m)   Wt (!) 158.8 kg   SpO2 94%   BMI 44.94 kg/m  If your blood pressure (BP) was elevated above 135/85 this visit, please have this repeated by your doctor within one month. --------------

## 2021-12-05 NOTE — ED Notes (Signed)
Pt amb to XR.

## 2021-12-05 NOTE — ED Provider Notes (Signed)
[facility] Provider Note   CSN: [id number] Arrival date & time: 12/05/21  1328     History  Chief Complaint  Patient presents with   Shoulder Pain    Brandon [name] is a 56 y.o. male.  Patient with history of DVT, not currently on anticoagulation, history of myocardial infarction in 2020 status post 2 coronary stents, recent lumbar laminectomy surgery --presents to the emergency department today for evaluation of right shoulder pain.  Patient describes this as throbbing.  It started at rest last evening.  He did not have associated chest pain or shortness of breath.  He was concerned that he was developing a blood clot in the arm or shoulder.  He states that the pain feels similar to shoulder pain he had with his MI, but is on the opposite side, and is not associated with shortness of breath or chest pain.  No lower extremity pain or swelling.  No fever or cough.  Today the pain is less severe, more aching and radiates to the neck and into the axilla.      Home Medications Prior to Admission medications   Medication Sig Start Date End Date Taking? Authorizing Provider  amLODipine (NORVASC) 2.5 MG tablet Take 2.5 mg by mouth daily. 07/19/21   [provider]  aspirin 81 MG chewable tablet Chew 1 tablet (81 mg total) by mouth daily. 04/24/18   Barrett, Evelene Croon, PA-C  ezetimibe (ZETIA) 10 MG tablet Take 1 tablet (10 mg total) by mouth daily. 11/30/21   Richardo Priest, MD  meloxicam (MOBIC) 15 MG tablet Take 15 mg by mouth daily as needed for pain. 07/19/21   [provider]  metFORMIN (GLUCOPHAGE) 500 MG tablet Take 500 mg by mouth 2 (two) times daily. 05/07/19   [provider]  metoprolol tartrate (LOPRESSOR) 25 MG tablet TAKE 1 TABLET BY MOUTH TWICE A DAY Patient taking differently: Take 25 mg by mouth 2 (two) times daily. 06/21/21   Richardo Priest, MD  Multiple Vitamin (MULTIVITAMIN WITH MINERALS) TABS tablet Take 1 tablet by  mouth daily.    [provider]  nitroGLYCERIN (NITROSTAT) 0.4 MG SL tablet Place 1 tablet (0.4 mg total) under the tongue every 5 (five) minutes as needed for chest pain. 04/24/18   Barrett, Evelene Croon, PA-C  oxyCODONE-acetaminophen (PERCOCET) 5-325 MG tablet Take 1 tablet by mouth every 4 (four) hours as needed. 09/18/21   Larene Pickett, PA-C  OZEMPIC, 0.25 OR 0.5 MG/DOSE, 2 MG/3ML SOPN Inject 0.5 mg into the skin once a week. 07/13/21   [provider]  pregabalin (LYRICA) 25 MG capsule Take 25 mg by mouth 3 (three) times daily. 03/04/21   [provider]  rosuvastatin (CRESTOR) 40 MG tablet Take 40 mg by mouth daily. 08/19/19   [provider]  telmisartan-hydrochlorothiazide (MICARDIS HCT) 40-12.5 MG tablet TAKE 1 TABLET BY MOUTH EVERY DAY 11/30/21   Richardo Priest, MD  traMADol (ULTRAM) 50 MG tablet Take 50 mg by mouth every 8 (eight) hours as needed for moderate pain. 09/10/21   [provider]  Vitamin D, Ergocalciferol, (DRISDOL) 1.25 MG (50000 UNIT) CAPS capsule Take 50,000 Units by mouth once a week. 04/21/19   [provider]      Allergies    Brilinta [ticagrelor]    Review of Systems   Review of Systems  Physical Exam Updated Vital Signs BP (!) 130/102   Pulse 66   Temp 97.9 F (36.6 C) (  Oral)   Resp 18   Ht 6\' 2"  (1.88 m)   Wt (!) 158.8 kg   SpO2 94%   BMI 44.94 kg/m   Physical Exam Vitals and nursing note reviewed.  Constitutional:      Appearance: He is well-developed.  HENT:     Head: Normocephalic and atraumatic.  Eyes:     Conjunctiva/sclera: Conjunctivae normal.  Cardiovascular:     Pulses: Normal pulses. No decreased pulses.          Radial pulses are 2+ on the right side and 2+ on the left side.  Musculoskeletal:        General: Tenderness present.     Right shoulder: Tenderness present. Normal range of motion.     Right upper arm: No swelling or edema.     Right forearm: No swelling or edema.      Cervical back: Normal range of motion and neck supple. No tenderness or bony tenderness. Normal range of motion.     Thoracic back: No tenderness or bony tenderness.     Lumbar back: No tenderness or bony tenderness.     Right lower leg: No edema.     Left lower leg: No edema.     Comments: Tenderness to the superior posterior shoulder to palpation  Skin:    General: Skin is warm and dry.  Neurological:     Mental Status: He is alert.     Sensory: No sensory deficit.     Comments: Motor, sensation, and vascular distal to the injury is fully intact.   Psychiatric:        Mood and Affect: Mood normal.     ED Results / Procedures / Treatments   Labs (all labs ordered are listed, but only abnormal results are displayed) Labs Reviewed - No data to display  ED ECG REPORT   Date: 12/05/2021  Rate: 68  Rhythm: normal sinus rhythm  QRS Axis: right  Intervals: normal  ST/T Wave abnormalities: normal  Conduction Disutrbances:left posterior fascicular block  Narrative Interpretation:   Old EKG Reviewed: unchanged from 08/06/21  I have personally reviewed the EKG tracing and agree with the computerized printout as noted.   Radiology US Venous Img Upper Uni Right(DVT)  Result Date: 12/05/2021 CLINICAL DATA:  Right arm pain, history of left lower extremity DVT EXAM: RIGHT UPPER EXTREMITY VENOUS DOPPLER ULTRASOUND TECHNIQUE: Gray-scale sonography with graded compression, as well as color Doppler and duplex ultrasound were performed to evaluate the upper extremity deep venous system from the level of the subclavian vein and including the jugular, axillary, basilic, radial, ulnar and upper cephalic vein. Spectral Doppler was utilized to evaluate flow at rest and with distal augmentation maneuvers. COMPARISON:  None Available. FINDINGS: Contralateral Subclavian Vein: Respiratory phasicity is normal and symmetric with the symptomatic side. No evidence of thrombus. Normal compressibility. Internal  Jugular Vein: No evidence of thrombus. Normal compressibility, respiratory phasicity and response to augmentation. Subclavian Vein: No evidence of thrombus. Normal compressibility, respiratory phasicity and response to augmentation. Axillary Vein: No evidence of thrombus. Normal compressibility, respiratory phasicity and response to augmentation. Cephalic Vein: No evidence of thrombus. Normal compressibility, respiratory phasicity and response to augmentation. Basilic Vein: No evidence of thrombus. Normal compressibility, respiratory phasicity and response to augmentation. Brachial Veins: No evidence of thrombus. Normal compressibility, respiratory phasicity and response to augmentation. Radial Veins: No evidence of thrombus. Normal compressibility, respiratory phasicity and response to augmentation. Ulnar Veins: No evidence of thrombus. Normal compressibility, respiratory phasicity and response  to augmentation. Venous Reflux:  None visualized. Other Findings:  None visualized. IMPRESSION: Negative examination for deep venous thrombosis in the right upper extremity. Electronically Signed   By: Delanna Ahmadi M.D.   On: 12/05/2021 15:17   DG Chest 2 View  Result Date: 12/05/2021 CLINICAL DATA:  Chest pain. EXAM: CHEST - 2 VIEW COMPARISON:  February 17, 2020. FINDINGS: The heart size and mediastinal contours are within normal limits. Both lungs are clear. The visualized skeletal structures are unremarkable. IMPRESSION: No active cardiopulmonary disease. Electronically Signed   By: Marijo Conception M.D.   On: 12/05/2021 14:56    Procedures Procedures    Medications Ordered in ED Medications - No data to display  ED Course/ Medical Decision Making/ A&P    Patient seen and examined. History obtained directly from patient.  Given cardiac history will ensure labs and blood work are normal.  Will obtain right upper extremity ultrasound given patient's concern for blood clot and history of same.  Labs/EKG:  Ordered CBC, BMP, troponin.  EKG reviewed and interpreted as above.  Imaging: Ordered upper extremity DVT study, chest x-ray.  Medications/Fluids: None ordered.  Most recent vital signs reviewed and are as follows: BP (!) 130/102   Pulse 66   Temp 97.9 F (36.6 C) (Oral)   Resp 18   Ht 6\' 2"  (1.88 m)   Wt (!) 158.8 kg   SpO2 94%   BMI 44.94 kg/m   Initial impression: Right shoulder pain, history of ACS.  5:36 PM Reassessment performed. Patient appears comfortable.  There was a delay in obtaining get troponin which is now completed.  Labs personally reviewed and interpreted including: CBC unremarkable; BMP glucose 128 blood otherwise unremarkable; troponin normal at 6.  Imaging personally visualized and interpreted including: Chest x-ray, agree negative.  We have reviewed results of DVT study, read as negative.  Reviewed pertinent lab work and imaging with patient at bedside. Questions answered.   Most current vital signs reviewed and are as follows: BP (!) 130/102   Pulse 66   Temp 97.9 F (36.6 C) (Oral)   Resp 18   Ht 6\' 2"  (1.88 m)   Wt (!) 158.8 kg   SpO2 94%   BMI 44.94 kg/m   Plan: Discharge to home.   Prescriptions written for: None  Other home care instructions discussed: Use of home meds including muscle relaxer as needed, ice and heat.  ED return instructions discussed: Return with worsening chest pain, shortness of breath, new symptoms or other concerns.  Follow-up instructions discussed: Patient encouraged to follow-up with their PCP in 3 days for recheck if not improved.                          Medical Decision Making Amount and/or Complexity of Data Reviewed Labs: ordered. Radiology: ordered.   For this patient's complaint of shoulder pain, the following emergent conditions were considered on the differential diagnosis: acute coronary syndrome, pulmonary embolism, pneumothorax, myocarditis, pericardial tamponade, aortic dissection, thoracic  aortic aneurysm complication, esophageal perforation.   Other causes were also considered including: gastroesophageal reflux disease, musculoskeletal pain including costochondritis, pneumonia/pleurisy, herpes zoster, pericarditis.  In regards to possibility of ACS, patient has atypical features of pain in regards to previous ischemic pain.  In regards to possibility of PE, no chest pain, shortness of breath, tachycardia.  No DVT noted.  Overall low concern.  Suspect pain is musculoskeletal or neuropathic in nature.  Right upper extremity  is neurovascularly intact without signs of arterial compromise.  The patient's vital signs, pertinent lab work and imaging were reviewed and interpreted as discussed in the ED course. Hospitalization was considered for further testing, treatments, or serial exams/observation. However as patient is well-appearing, has a stable exam, and reassuring studies today, I do not feel that they warrant admission at this time. This plan was discussed with the patient who verbalizes agreement and comfort with this plan and seems reliable and able to return to the Emergency Department with worsening or changing symptoms.          Final Clinical Impression(s) / ED Diagnoses Final diagnoses:  Acute pain of right shoulder    Rx / DC Orders ED Discharge Orders     None         Renne Crigler, PA-C 12/05/21 1738    Rondel Baton, MD 12/12/21 1311

## 2021-12-05 NOTE — ED Triage Notes (Signed)
Patient c/o sharp pain in his right shoulder that radiates into his neck. Patient states he had surgery on his lower back at the end of October and has a history of blood clots and wanted to rule out having a blood clot. Denies any injury, states the pain happened suddenly before going to bed last night.

## 2021-12-09 DIAGNOSIS — M48062 Spinal stenosis, lumbar region with neurogenic claudication: Secondary | ICD-10-CM | POA: Diagnosis not present

## 2021-12-09 DIAGNOSIS — Z6841 Body Mass Index (BMI) 40.0 and over, adult: Secondary | ICD-10-CM | POA: Diagnosis not present

## 2021-12-22 DIAGNOSIS — J019 Acute sinusitis, unspecified: Secondary | ICD-10-CM | POA: Diagnosis not present

## 2021-12-22 DIAGNOSIS — J209 Acute bronchitis, unspecified: Secondary | ICD-10-CM | POA: Diagnosis not present

## 2021-12-22 DIAGNOSIS — R070 Pain in throat: Secondary | ICD-10-CM | POA: Diagnosis not present

## 2021-12-22 DIAGNOSIS — R509 Fever, unspecified: Secondary | ICD-10-CM | POA: Diagnosis not present

## 2022-01-02 ENCOUNTER — Other Ambulatory Visit: Payer: Self-pay | Admitting: Cardiology

## 2022-01-26 DIAGNOSIS — G4733 Obstructive sleep apnea (adult) (pediatric): Secondary | ICD-10-CM | POA: Diagnosis not present

## 2022-01-26 DIAGNOSIS — M48062 Spinal stenosis, lumbar region with neurogenic claudication: Secondary | ICD-10-CM | POA: Diagnosis not present

## 2022-01-27 DIAGNOSIS — E785 Hyperlipidemia, unspecified: Secondary | ICD-10-CM | POA: Diagnosis not present

## 2022-01-27 DIAGNOSIS — Z125 Encounter for screening for malignant neoplasm of prostate: Secondary | ICD-10-CM | POA: Diagnosis not present

## 2022-02-03 DIAGNOSIS — Z1331 Encounter for screening for depression: Secondary | ICD-10-CM | POA: Diagnosis not present

## 2022-02-03 DIAGNOSIS — E785 Hyperlipidemia, unspecified: Secondary | ICD-10-CM | POA: Diagnosis not present

## 2022-02-03 DIAGNOSIS — I10 Essential (primary) hypertension: Secondary | ICD-10-CM | POA: Diagnosis not present

## 2022-02-03 DIAGNOSIS — E1165 Type 2 diabetes mellitus with hyperglycemia: Secondary | ICD-10-CM | POA: Diagnosis not present

## 2022-02-03 DIAGNOSIS — Z6841 Body Mass Index (BMI) 40.0 and over, adult: Secondary | ICD-10-CM | POA: Diagnosis not present

## 2022-02-08 DIAGNOSIS — R202 Paresthesia of skin: Secondary | ICD-10-CM | POA: Diagnosis not present

## 2022-02-08 DIAGNOSIS — M2560 Stiffness of unspecified joint, not elsewhere classified: Secondary | ICD-10-CM | POA: Diagnosis not present

## 2022-02-08 DIAGNOSIS — M48062 Spinal stenosis, lumbar region with neurogenic claudication: Secondary | ICD-10-CM | POA: Diagnosis not present

## 2022-02-08 DIAGNOSIS — R531 Weakness: Secondary | ICD-10-CM | POA: Diagnosis not present

## 2022-02-09 DIAGNOSIS — E1165 Type 2 diabetes mellitus with hyperglycemia: Secondary | ICD-10-CM | POA: Diagnosis not present

## 2022-02-17 DIAGNOSIS — M2560 Stiffness of unspecified joint, not elsewhere classified: Secondary | ICD-10-CM | POA: Diagnosis not present

## 2022-02-17 DIAGNOSIS — R202 Paresthesia of skin: Secondary | ICD-10-CM | POA: Diagnosis not present

## 2022-02-17 DIAGNOSIS — R531 Weakness: Secondary | ICD-10-CM | POA: Diagnosis not present

## 2022-02-17 DIAGNOSIS — M48062 Spinal stenosis, lumbar region with neurogenic claudication: Secondary | ICD-10-CM | POA: Diagnosis not present

## 2022-02-24 DIAGNOSIS — M48062 Spinal stenosis, lumbar region with neurogenic claudication: Secondary | ICD-10-CM | POA: Diagnosis not present

## 2022-02-24 DIAGNOSIS — R202 Paresthesia of skin: Secondary | ICD-10-CM | POA: Diagnosis not present

## 2022-02-24 DIAGNOSIS — M2560 Stiffness of unspecified joint, not elsewhere classified: Secondary | ICD-10-CM | POA: Diagnosis not present

## 2022-02-24 DIAGNOSIS — R531 Weakness: Secondary | ICD-10-CM | POA: Diagnosis not present

## 2022-02-26 DIAGNOSIS — G4733 Obstructive sleep apnea (adult) (pediatric): Secondary | ICD-10-CM | POA: Diagnosis not present

## 2022-02-26 DIAGNOSIS — R0981 Nasal congestion: Secondary | ICD-10-CM | POA: Diagnosis not present

## 2022-02-26 DIAGNOSIS — R051 Acute cough: Secondary | ICD-10-CM | POA: Diagnosis not present

## 2022-02-26 DIAGNOSIS — J069 Acute upper respiratory infection, unspecified: Secondary | ICD-10-CM | POA: Diagnosis not present

## 2022-03-10 DIAGNOSIS — M48062 Spinal stenosis, lumbar region with neurogenic claudication: Secondary | ICD-10-CM | POA: Diagnosis not present

## 2022-03-10 DIAGNOSIS — R202 Paresthesia of skin: Secondary | ICD-10-CM | POA: Diagnosis not present

## 2022-03-10 DIAGNOSIS — R531 Weakness: Secondary | ICD-10-CM | POA: Diagnosis not present

## 2022-03-10 DIAGNOSIS — M2560 Stiffness of unspecified joint, not elsewhere classified: Secondary | ICD-10-CM | POA: Diagnosis not present

## 2022-03-24 DIAGNOSIS — R202 Paresthesia of skin: Secondary | ICD-10-CM | POA: Diagnosis not present

## 2022-03-24 DIAGNOSIS — M48062 Spinal stenosis, lumbar region with neurogenic claudication: Secondary | ICD-10-CM | POA: Diagnosis not present

## 2022-03-24 DIAGNOSIS — R531 Weakness: Secondary | ICD-10-CM | POA: Diagnosis not present

## 2022-03-24 DIAGNOSIS — M2560 Stiffness of unspecified joint, not elsewhere classified: Secondary | ICD-10-CM | POA: Diagnosis not present

## 2022-03-27 DIAGNOSIS — G4733 Obstructive sleep apnea (adult) (pediatric): Secondary | ICD-10-CM | POA: Diagnosis not present

## 2022-03-31 DIAGNOSIS — M48062 Spinal stenosis, lumbar region with neurogenic claudication: Secondary | ICD-10-CM | POA: Diagnosis not present

## 2022-03-31 DIAGNOSIS — R202 Paresthesia of skin: Secondary | ICD-10-CM | POA: Diagnosis not present

## 2022-03-31 DIAGNOSIS — R531 Weakness: Secondary | ICD-10-CM | POA: Diagnosis not present

## 2022-03-31 DIAGNOSIS — M2560 Stiffness of unspecified joint, not elsewhere classified: Secondary | ICD-10-CM | POA: Diagnosis not present

## 2022-04-05 DIAGNOSIS — R531 Weakness: Secondary | ICD-10-CM | POA: Diagnosis not present

## 2022-04-05 DIAGNOSIS — M2560 Stiffness of unspecified joint, not elsewhere classified: Secondary | ICD-10-CM | POA: Diagnosis not present

## 2022-04-05 DIAGNOSIS — M48062 Spinal stenosis, lumbar region with neurogenic claudication: Secondary | ICD-10-CM | POA: Diagnosis not present

## 2022-04-05 DIAGNOSIS — R202 Paresthesia of skin: Secondary | ICD-10-CM | POA: Diagnosis not present

## 2022-04-19 DIAGNOSIS — M25511 Pain in right shoulder: Secondary | ICD-10-CM | POA: Diagnosis not present

## 2022-04-21 DIAGNOSIS — M48062 Spinal stenosis, lumbar region with neurogenic claudication: Secondary | ICD-10-CM | POA: Diagnosis not present

## 2022-04-24 ENCOUNTER — Emergency Department (HOSPITAL_COMMUNITY)
Admission: EM | Admit: 2022-04-24 | Discharge: 2022-04-24 | Disposition: A | Discharge status: Home / Self Care | Payer: BC Managed Care – PPO | Attending: Emergency Medicine | Admitting: Emergency Medicine

## 2022-04-24 ENCOUNTER — Emergency Department (HOSPITAL_COMMUNITY): Payer: BC Managed Care – PPO

## 2022-04-24 ENCOUNTER — Encounter (HOSPITAL_COMMUNITY): Payer: Self-pay

## 2022-04-24 ENCOUNTER — Other Ambulatory Visit: Payer: Self-pay

## 2022-04-24 DIAGNOSIS — E119 Type 2 diabetes mellitus without complications: Secondary | ICD-10-CM | POA: Diagnosis not present

## 2022-04-24 DIAGNOSIS — Z8616 Personal history of COVID-19: Secondary | ICD-10-CM | POA: Insufficient documentation

## 2022-04-24 DIAGNOSIS — Z7984 Long term (current) use of oral hypoglycemic drugs: Secondary | ICD-10-CM | POA: Diagnosis not present

## 2022-04-24 DIAGNOSIS — Z79899 Other long term (current) drug therapy: Secondary | ICD-10-CM | POA: Diagnosis not present

## 2022-04-24 DIAGNOSIS — I10 Essential (primary) hypertension: Secondary | ICD-10-CM | POA: Insufficient documentation

## 2022-04-24 DIAGNOSIS — Z7982 Long term (current) use of aspirin: Secondary | ICD-10-CM | POA: Insufficient documentation

## 2022-04-24 DIAGNOSIS — Z87891 Personal history of nicotine dependence: Secondary | ICD-10-CM | POA: Insufficient documentation

## 2022-04-24 DIAGNOSIS — R0602 Shortness of breath: Secondary | ICD-10-CM | POA: Diagnosis not present

## 2022-04-24 DIAGNOSIS — I2510 Atherosclerotic heart disease of native coronary artery without angina pectoris: Secondary | ICD-10-CM | POA: Insufficient documentation

## 2022-04-24 LAB — CBC
HCT: 48.7 % (ref 39.0–52.0)
Hemoglobin: 16.3 g/dL (ref 13.0–17.0)
MCH: 30.9 pg (ref 26.0–34.0)
MCHC: 33.5 g/dL (ref 30.0–36.0)
MCV: 92.4 fL (ref 80.0–100.0)
Platelets: 308 10*3/uL (ref 150–400)
RBC: 5.27 MIL/uL (ref 4.22–5.81)
RDW: 14.2 % (ref 11.5–15.5)
WBC: 10.7 10*3/uL — ABNORMAL HIGH (ref 4.0–10.5)
nRBC: 0 % (ref 0.0–0.2)

## 2022-04-24 LAB — BASIC METABOLIC PANEL
Anion gap: 11 (ref 5–15)
BUN: 18 mg/dL (ref 6–20)
CO2: 24 mmol/L (ref 22–32)
Calcium: 9.4 mg/dL (ref 8.9–10.3)
Chloride: 103 mmol/L (ref 98–111)
Creatinine, Ser: 0.84 mg/dL (ref 0.61–1.24)
GFR, Estimated: >60 mL/min (ref >60)
Glucose, Bld: 147 mg/dL — ABNORMAL HIGH (ref 70–99)
Potassium: 4.4 mmol/L (ref 3.5–5.1)
Sodium: 138 mmol/L (ref 135–145)

## 2022-04-24 LAB — TROPONIN I (HIGH SENSITIVITY)
Troponin I (High Sensitivity): 8 ng/L (ref <18)
Troponin I (High Sensitivity): 12 ng/L (ref <18)

## 2022-04-24 LAB — D-DIMER, QUANTITATIVE: D-Dimer, Quant: <0.27 ug/mL-FEU (ref 0.00–0.50)

## 2022-04-24 NOTE — ED Provider Notes (Signed)
[place] EMERGENCY DEPARTMENT AT [facility] Provider Note  CSN: [id number] Arrival date & time: 04/24/22 0746  Chief Complaint(s) Shortness of Breath  HPI Brandon [name] is a 57 y.o. male with history of coronary artery disease, prior DVT not on anticoagulation, hypertension, diabetes presenting to the emergency department with episode of shortness of breath.  Patient reports that he was at home this morning, noticed funny feeling in his chest, not described as a pain or pressure, also having shortness of breath, lightheadedness.  Tried to ignore it but it was persistently bothersome so he checked his Apple Watch and notes that he could be in A-fib.  This episode lasted a few hours.  He reports that he feels better now and just has mild lightheadedness.  No nausea or vomiting.  No back pain or abdominal pain.  No syncope.  No numbness, tingling, weakness, vision changes.  No headaches.  Past Medical History Past Medical History:  Diagnosis Date   ACS (acute coronary syndrome) 04/2018   Acute coronary syndrome 04/23/2018   Acute deep vein thrombosis (DVT) of tibial vein of right lower extremity 10/21/2019   Formatting of this note might be different from the original. 09/24/2019: RIGHT LOWER EXTREMITY VENOUS DOPPLER ULTRASOUND Evaluation of the infrapopliteal venous vasculature demonstrates acute thrombosis of 1 of the paired posterior tibial veins of the right lower extremity. This does not extend into the popliteal vein itself   Arthritis    both knees   Arthritis of knee, degenerative 05/14/2015   CAD in native artery 05/06/2018   Chest pain 06/23/2018   Chronic right shoulder pain 05/28/2015   Complication of anesthesia    " i HAVE A HARD TIME WAKING UP "   Coronary artery disease    COVID-19 02/03/2020   DVT (deep venous thrombosis) 10/21/2019   Formatting of this note might be different from the original. 09/24/2019: RIGHT LOWER EXTREMITY VENOUS DOPPLER ULTRASOUND Evaluation of  the infrapopliteal venous vasculature demonstrates acute thrombosis of 1 of the paired posterior tibial veins of the right lower extremity. This does not extend into the popliteal vein itself   Essential hypertension 05/14/2015   Facial twitching 09/10/2018   Gross hematuria 07/16/2019   Formatting of this note might be different from the original. 2021   Herpes zoster without complication 05/20/2019   History of non-ST elevation myocardial infarction (NSTEMI) 07/30/2018   Hypertension    Laryngopharyngeal reflux (LPR) 05/06/2019   Mixed hyperlipidemia 05/14/2015   Non-ST elevation (NSTEMI) myocardial infarction    Pre-diabetes 03/30/2016   2017: x/6.7 2018: 103/6.4   Right elbow pain 05/28/2015   SOB (shortness of breath) on exertion 05/16/2018   Type 2 diabetes mellitus without complication, without long-term current use of insulin 03/30/2016   Formatting of this note might be different from the original. 2017: x/6.7 2018: 103/6.4 2021: 7.8   Patient Active Problem List   Diagnosis Date Noted   COVID-19 02/03/2020   Hypertension    Coronary artery disease    Complication of anesthesia    Arthritis    DVT (deep venous thrombosis) 10/21/2019   Acute deep vein thrombosis (DVT) of tibial vein of right lower extremity 10/21/2019   Gross hematuria 07/16/2019   Herpes zoster without complication 05/20/2019   Laryngopharyngeal reflux (LPR) 05/06/2019   Facial twitching 09/10/2018   History of non-ST elevation myocardial infarction (NSTEMI) 07/30/2018   Chest pain 06/23/2018   SOB (shortness of breath) on exertion 05/16/2018   CAD in native artery 05/06/2018  Acute coronary syndrome 04/23/2018   Non-ST elevation (NSTEMI) myocardial infarction    ACS (acute coronary syndrome) 04/2018   Pre-diabetes 03/30/2016   Type 2 diabetes mellitus without complication, without long-term current use of insulin 03/30/2016   Chronic right shoulder pain 05/28/2015   Right elbow pain 05/28/2015   Essential  hypertension 05/14/2015   Mixed hyperlipidemia 05/14/2015   Arthritis of knee, degenerative 05/14/2015   Home Medication(s) Prior to Admission medications   Medication Sig Start Date End Date Taking? Authorizing Provider  amLODipine (NORVASC) 2.5 MG tablet Take 2.5 mg by mouth daily. 07/19/21   [provider]  aspirin 81 MG chewable tablet Chew 1 tablet (81 mg total) by mouth daily. 04/24/18   Barrett, Joline Salt, PA-C  ezetimibe (ZETIA) 10 MG tablet Take 1 tablet (10 mg total) by mouth daily. 11/30/21   Baldo Daub, MD  meloxicam (MOBIC) 15 MG tablet Take 15 mg by mouth daily as needed for pain. 07/19/21   [provider]  metFORMIN (GLUCOPHAGE) 500 MG tablet Take 500 mg by mouth 2 (two) times daily. 05/07/19   [provider]  metoprolol tartrate (LOPRESSOR) 25 MG tablet Take 1 tablet (25 mg total) by mouth 2 (two) times daily. 01/04/22   Baldo Daub, MD  Multiple Vitamin (MULTIVITAMIN WITH MINERALS) TABS tablet Take 1 tablet by mouth daily.    [provider]  nitroGLYCERIN (NITROSTAT) 0.4 MG SL tablet Place 1 tablet (0.4 mg total) under the tongue every 5 (five) minutes as needed for chest pain. 04/24/18   Barrett, Joline Salt, PA-C  oxyCODONE-acetaminophen (PERCOCET) 5-325 MG tablet Take 1 tablet by mouth every 4 (four) hours as needed. 09/18/21   Garlon Hatchet, PA-C  OZEMPIC, 0.25 OR 0.5 MG/DOSE, 2 MG/3ML SOPN Inject 0.5 mg into the skin once a week. 07/13/21   [provider]  pregabalin (LYRICA) 25 MG capsule Take 25 mg by mouth 3 (three) times daily. 03/04/21   [provider]  rosuvastatin (CRESTOR) 40 MG tablet Take 40 mg by mouth daily. 08/19/19   [provider]  telmisartan-hydrochlorothiazide (MICARDIS HCT) 40-12.5 MG tablet TAKE 1 TABLET BY MOUTH EVERY DAY 11/30/21   Baldo Daub, MD  traMADol (ULTRAM) 50 MG tablet Take 50 mg by mouth every 8 (eight) hours as needed for moderate pain. 09/10/21   [provider]   Vitamin D, Ergocalciferol, (DRISDOL) 1.25 MG (50000 UNIT) CAPS capsule Take 50,000 Units by mouth once a week. 04/21/19   [provider]                                                                                                                                    Past Surgical History Past Surgical History:  Procedure Laterality Date   CORONARY STENT INTERVENTION N/A 04/23/2018   Procedure: CORONARY STENT INTERVENTION;  Surgeon: Tonny Bollman, MD;  Location: [facility] INVASIVE CV LAB;  Service: Cardiovascular;  Laterality: N/A;   EXCISION HAGLUND'S DEFORMITY WITH ACHILLES TENDON REPAIR Right 05/22/2014   Procedure: RIGHT ACHILLES DEBRIDEMENT; HAGLUND'S EXCISION ;  Surgeon: Toni Arthurs, MD;  Location: [facility];  Service: Orthopedics;  Laterality: Right;   GASTROCNEMIUS RECESSION Right 05/22/2014   Procedure: GASTROC RECESSION ;  Surgeon: Toni Arthurs, MD;  Location: [facility];  Service: Orthopedics;  Laterality: Right;   KNEE SURGERY     l/knee   LAMINECTOMY     LAPAROSCOPIC GASTRIC SLEEVE RESECTION     LEFT HEART CATH AND CORONARY ANGIOGRAPHY N/A 04/23/2018   Procedure: LEFT HEART CATH AND CORONARY ANGIOGRAPHY;  Surgeon: Tonny Bollman, MD;  Location: [facility] INVASIVE CV LAB;  Service: Cardiovascular;  Laterality: N/A;   MOUTH SURGERY     Root canal   Family History Family History  Problem Relation Age of Onset   Cancer Mother    Heart disease Father 82   Hypertension Father     Social History Social History   Tobacco Use   Smoking status: Former    Packs/day: 2.00    Years: 10.00    Additional pack years: 0.00    Total pack years: 20.00    Types: Cigarettes   Smokeless tobacco: Never   Tobacco comments:    Quti smoking 20 years ago  Vaping Use   Vaping Use: Never used  Substance Use Topics   Alcohol use: Not Currently    Comment: occ   Drug use: No   Allergies Brilinta [ticagrelor]  Review of Systems Review of Systems  All  other systems reviewed and are negative.   Physical Exam Vital Signs  I have reviewed the triage vital signs BP (!) 117/55   Pulse 73   Temp 97.6 F (36.4 C) (Oral)   Resp 17   Ht  (1.88 m)   Wt (!) 163.3 kg   SpO2 100%   BMI 46.22 kg/m  Physical Exam Vitals and nursing note reviewed.  Constitutional:      General: He is not in acute distress.    Appearance: Normal appearance. He is obese.  HENT:     Mouth/Throat:     Mouth: Mucous membranes are moist.  Eyes:     Conjunctiva/sclera: Conjunctivae normal.  Cardiovascular:     Rate and Rhythm: Normal rate and regular rhythm.     Heart sounds: No murmur heard. Pulmonary:     Effort: Pulmonary effort is normal. No respiratory distress.     Breath sounds: Normal breath sounds.  Abdominal:     General: Abdomen is flat.     Palpations: Abdomen is soft.     Tenderness: There is no abdominal tenderness.  Musculoskeletal:     Right lower leg: No edema.     Left lower leg: No edema.  Skin:    General: Skin is warm and dry.     Capillary Refill: Capillary refill takes less than 2 seconds.  Neurological:     Mental Status: He is alert and oriented to person, place, and time. Mental status is at baseline.  Psychiatric:        Mood and Affect: Mood normal.        Behavior: Behavior normal.     ED Results and Treatments Labs (all labs ordered are listed, but only abnormal results are displayed) Labs Reviewed  BASIC METABOLIC PANEL - Abnormal; Notable for the following components:      Result Value   Glucose, Bld 147 (*)  All other components within normal limits  CBC - Abnormal; Notable for the following components:   WBC 10.7 (*)    All other components within normal limits  D-DIMER, QUANTITATIVE  TROPONIN I (HIGH SENSITIVITY)  TROPONIN I (HIGH SENSITIVITY)                                                                                                                          Radiology DG Chest 2  View  Result Date: 04/24/2022 CLINICAL DATA:  Shortness of breath EXAM: CHEST - 2 VIEW COMPARISON:  12/05/2021 FINDINGS: The heart size and mediastinal contours are within normal limits. Mildly coarsened bibasilar interstitial markings. No focal airspace consolidation. No pleural effusion or pneumothorax. The visualized skeletal structures are unremarkable. IMPRESSION: Mildly coarsened bibasilar interstitial markings, which may reflect bronchitic type lung changes. No focal airspace consolidation. Electronically Signed   By: Duanne Guess D.O.   On: 04/24/2022 09:26    Pertinent labs & imaging results that were available during my care of the patient were reviewed by me and considered in my medical decision making (see MDM for details).  Medications Ordered in ED Medications - No data to display                                                                                                                                   Procedures Procedures  (including critical care time)  Medical Decision Making / ED Course   MDM:  57 year old male presenting to the emergency department with shortness of breath.  Patient well-appearing, physical exam unremarkable.  Currently in regular rate and rhythm.  EKG here shows sinus rhythm.  Differential includes pulmonary embolism, patient does have history of DVT not on anticoagulation, although no pleuritic pain patient not hypoxic or tachycardic, will check D-dimer.  Also includes ACS although did not really have any chest pain, could be anginal equivalent, will check troponin x 2.  Patient alerted by his Apple Watch that he had possible atrial fibrillation, reviewed his Apple Watch tracing which does show tachycardia and an irregular rate, but has wandering baseline with what appeared to be possibly P waves and frequent PVCs.  Patient definitely needs close follow-up and probably outpatient monitoring but without better tracing or EKG would defer  initiating anticoagulation right now.  Will also check chest x-ray to evaluate for pneumonia or pneumothorax although no cough, lungs clear bilaterally without  focal findings.  Patient does not appear volume overloaded to suggest CHF.  Will reassess.  Clinical Course as of 04/24/22 1201  Sun Apr 24, 2022  1200 Patient feels better.  D-dimer negative.  Troponin negative x 2.  No episodes of tachycardia noted.  Will place cardiology referral for expedited follow-up given possible A-fib, patient may need further outpatient monitoring.  Overall very low concern for ACS.  Discussed return precautions. Will discharge patient to home. All questions answered. Patient comfortable with plan of discharge. Return precautions discussed with patient and specified on the after visit summary.  [WS]    Clinical Course User Index [WS] Lonell Grandchild, MD       Lab Tests: -I ordered, reviewed, and interpreted labs.   The pertinent results include:   Labs Reviewed  BASIC METABOLIC PANEL - Abnormal; Notable for the following components:      Result Value   Glucose, Bld 147 (*)    All other components within normal limits  CBC - Abnormal; Notable for the following components:   WBC 10.7 (*)    All other components within normal limits  D-DIMER, QUANTITATIVE  TROPONIN I (HIGH SENSITIVITY)  TROPONIN I (HIGH SENSITIVITY)    Notable for normal troponin x2, normal d-dimer  EKG   EKG Interpretation  Date/Time:  Sunday April 24 2022 07:58:23 EDT Ventricular Rate:  75 PR Interval:  157 QRS Duration: 98 QT Interval:  360 QTC Calculation: 402 R Axis:   134 Text Interpretation: Sinus rhythm Right axis deviation Confirmed by Alvino Blood ([id number]) on 04/24/2022 8:14:14 AM         Imaging Studies ordered: I ordered imaging studies including CXR On my interpretation imaging demonstrates no acute process I independently visualized and interpreted imaging. I agree with the radiologist  interpretation   Medicines ordered and prescription drug management: No orders of the defined types were placed in this encounter.   -I have reviewed the patients home medicines and have made adjustments as needed   Cardiac Monitoring: The patient was maintained on a cardiac monitor.  I personally viewed and interpreted the cardiac monitored which showed an underlying rhythm of: NSR  Social Determinants of Health:  Diagnosis or treatment significantly limited by social determinants of health: obesity   Reevaluation: After the interventions noted above, I reevaluated the patient and found that their symptoms have resolved  Co morbidities that complicate the patient evaluation  Past Medical History:  Diagnosis Date   ACS (acute coronary syndrome) 04/2018   Acute coronary syndrome 04/23/2018   Acute deep vein thrombosis (DVT) of tibial vein of right lower extremity 10/21/2019   Formatting of this note might be different from the original. 09/24/2019: RIGHT LOWER EXTREMITY VENOUS DOPPLER ULTRASOUND Evaluation of the infrapopliteal venous vasculature demonstrates acute thrombosis of 1 of the paired posterior tibial veins of the right lower extremity. This does not extend into the popliteal vein itself   Arthritis    both knees   Arthritis of knee, degenerative 05/14/2015   CAD in native artery 05/06/2018   Chest pain 06/23/2018   Chronic right shoulder pain 05/28/2015   Complication of anesthesia    " i HAVE A HARD TIME WAKING UP "   Coronary artery disease    COVID-19 02/03/2020   DVT (deep venous thrombosis) 10/21/2019   Formatting of this note might be different from the original. 09/24/2019: RIGHT LOWER EXTREMITY VENOUS DOPPLER ULTRASOUND Evaluation of the infrapopliteal venous vasculature demonstrates acute thrombosis of 1 of the  paired posterior tibial veins of the right lower extremity. This does not extend into the popliteal vein itself   Essential hypertension 05/14/2015   Facial  twitching 09/10/2018   Gross hematuria 07/16/2019   Formatting of this note might be different from the original. 2021   Herpes zoster without complication 05/20/2019   History of non-ST elevation myocardial infarction (NSTEMI) 07/30/2018   Hypertension    Laryngopharyngeal reflux (LPR) 05/06/2019   Mixed hyperlipidemia 05/14/2015   Non-ST elevation (NSTEMI) myocardial infarction    Pre-diabetes 03/30/2016   2017: x/6.7 2018: 103/6.4   Right elbow pain 05/28/2015   SOB (shortness of breath) on exertion 05/16/2018   Type 2 diabetes mellitus without complication, without long-term current use of insulin 03/30/2016   Formatting of this note might be different from the original. 2017: x/6.7 2018: 103/6.4 2021: 7.8      Dispostion: Disposition decision including need for hospitalization was considered, and patient discharged from emergency department.    Final Clinical Impression(s) / ED Diagnoses Final diagnoses:  Shortness of breath     This chart was dictated using voice recognition software.  Despite best efforts to proofread,  errors can occur which can change the documentation meaning.    Lonell Grandchild, MD 04/24/22 [phone]

## 2022-04-24 NOTE — Discharge Instructions (Signed)
We evaluated you for your shortness of breath and abnormal sensation in your chest.  Your cardiac enzymes were negative and your EKG did not show signs of a heart attack.  We also did not see any evidence of a blood clot on your blood work.  Your chest x-ray was clear.  It is possible that your symptoms could be due to atrial fibrillation.  It is hard to say definitively whether you have had atrial fibrillation or not based on your Apple Watch.  If you do have atrial fibrillation, you may need to start a blood thinner for stroke prevention.  I would recommend following up with cardiology as soon as possible so they can determine whether you need an outpatient heart monitor.  I have placed a cardiology referral since your next appoint with cardiology is not until May.  Please return to the emergency department if you have any recurrent symptoms, palpitations, fainting, lightheadedness or dizziness, chest pain, recurrent shortness of breath, sweating, or any other concerning symptoms.

## 2022-04-24 NOTE — ED Triage Notes (Signed)
Pt came to ED for a weird feeling in trachea and chest for a couple of hours. Pt SHOB. Hx of heart attack. Pts watch states he is in afib. Axox4. VSS.

## 2022-04-25 DIAGNOSIS — M25511 Pain in right shoulder: Secondary | ICD-10-CM | POA: Diagnosis not present

## 2022-04-27 DIAGNOSIS — G4733 Obstructive sleep apnea (adult) (pediatric): Secondary | ICD-10-CM | POA: Diagnosis not present

## 2022-05-02 ENCOUNTER — Telehealth: Payer: Self-pay

## 2022-05-02 DIAGNOSIS — S46011A Strain of muscle(s) and tendon(s) of the rotator cuff of right shoulder, initial encounter: Secondary | ICD-10-CM | POA: Diagnosis not present

## 2022-05-02 DIAGNOSIS — M25511 Pain in right shoulder: Secondary | ICD-10-CM | POA: Diagnosis not present

## 2022-05-02 NOTE — Telephone Encounter (Signed)
   Pre-operative Risk Assessment    Patient Name: Brandon [name]  DOB: 04-Feb-1965 MRN: [id number]      Request for Surgical Clearance    Procedure:   Right Shoulder Arthroscopic vs Open Rotator Cuff Repair and other procedures as indicated  Date of Surgery:  Clearance TBD                                 Surgeon:  Linda Hedges, MD Surgeon's Group or Practice Name:  [facility] Phone number:  [phone] Fax number:  [phone]   Type of Clearance Requested:   - Medical    Type of Anesthesia:  General    Additional requests/questions:    [name]   05/02/2022, 5:12 PM

## 2022-05-03 ENCOUNTER — Telehealth: Payer: Self-pay | Admitting: *Deleted

## 2022-05-03 NOTE — Telephone Encounter (Signed)
Primary Cardiologist:Brandon Dulce Sellar, MD   Preoperative team, please contact this patient and set up a phone call appointment for further preoperative risk assessment. Please obtain consent and complete medication review. Thank you for your help.   Per office protocol and pending no s/s of ACS at time of virtual visit, he may hold aspirin for 5-7 days prior to procedure and should resume as soon as hemodynamically stable postoperatively.   [name], NP-C  05/03/2022, 9:52 AM [address] Office [phone] Fax [phone]

## 2022-05-03 NOTE — Telephone Encounter (Signed)
Left message for the pt to call back to pre op. Pre op APP asked to make tele appt. Pt has in office with Dr. Dulce Sellar 06/07/22. I do not see that surgery has been scheduled yet, so would like to d/w pt further to see what he would like to. If we do tele appt this would mean that his insurance is billed for 2 appts that are few weeks apart. We certainly can set up tele if pt would like to proceed with tele.

## 2022-05-03 NOTE — Telephone Encounter (Signed)
Pt has been scheduled for tele pre op appt 05/10/22 @ 9:20. Med rec and consent are done.     Patient Consent for Virtual Visit        Brandon [name] has provided verbal consent on 05/03/2022 for a virtual visit (video or telephone).   CONSENT FOR VIRTUAL VISIT FOR:  Brandon [name]  By participating in this virtual visit I agree to the following:  I hereby voluntarily request, consent and authorize [facility] and its employed or contracted physicians, physician assistants, nurse practitioners or other licensed health care professionals (the Practitioner), to provide me with telemedicine health care services (the "Services") as deemed necessary by the treating Practitioner. I acknowledge and consent to receive the Services by the Practitioner via telemedicine. I understand that the telemedicine visit will involve communicating with the Practitioner through live audiovisual communication technology and the disclosure of certain medical information by electronic transmission. I acknowledge that I have been given the opportunity to request an in-person assessment or other available alternative prior to the telemedicine visit and am voluntarily participating in the telemedicine visit.  I understand that I have the right to withhold or withdraw my consent to the use of telemedicine in the course of my care at any time, without affecting my right to future care or treatment, and that the Practitioner or I may terminate the telemedicine visit at any time. I understand that I have the right to inspect all information obtained and/or recorded in the course of the telemedicine visit and may receive copies of available information for a reasonable fee.  I understand that some of the potential risks of receiving the Services via telemedicine include:  Delay or interruption in medical evaluation due to technological equipment failure or disruption; Information transmitted may not be sufficient (e.g.  poor resolution of images) to allow for appropriate medical decision making by the Practitioner; and/or  In rare instances, security protocols could fail, causing a breach of personal health information.  Furthermore, I acknowledge that it is my responsibility to provide information about my medical history, conditions and care that is complete and accurate to the best of my ability. I acknowledge that Practitioner's advice, recommendations, and/or decision may be based on factors not within their control, such as incomplete or inaccurate data provided by me or distortions of diagnostic images or specimens that may result from electronic transmissions. I understand that the practice of medicine is not an exact science and that Practitioner makes no warranties or guarantees regarding treatment outcomes. I acknowledge that a copy of this consent can be made available to me via my patient portal [facility] MyChart), or I can request a printed copy by calling the office of [facility].    I understand that my insurance will be billed for this visit.   I have read or had this consent read to me. I understand the contents of this consent, which adequately explains the benefits and risks of the Services being provided via telemedicine.  I have been provided ample opportunity to ask questions regarding this consent and the Services and have had my questions answered to my satisfaction. I give my informed consent for the services to be provided through the use of telemedicine in my medical care

## 2022-05-03 NOTE — Telephone Encounter (Signed)
Pt has been scheduled for tele pre op appt 05/10/22 @ 9:20. Med rec and consent are done.

## 2022-05-06 DIAGNOSIS — E1165 Type 2 diabetes mellitus with hyperglycemia: Secondary | ICD-10-CM | POA: Diagnosis not present

## 2022-05-06 DIAGNOSIS — E785 Hyperlipidemia, unspecified: Secondary | ICD-10-CM | POA: Diagnosis not present

## 2022-05-08 NOTE — Progress Notes (Unsigned)
Virtual Visit via Telephone Note   Because of Brandon [name]'s co-morbid illnesses, he is at least at moderate risk for complications without adequate follow up.  This format is felt to be most appropriate for this patient at this time.  The patient did not have access to video technology/had technical difficulties with video requiring transitioning to audio format only (telephone).  All issues noted in this document were discussed and addressed.  No physical exam could be performed with this format.  Please refer to the patient's chart for his consent to telehealth for [facility].  Evaluation Performed:  Preoperative cardiovascular risk assessment _____________   Date:  05/08/2022   Patient ID:  Brandon [name], DOB Aug 25, 1965, MRN [id number] Patient Location:  Home Provider location:   Office  Primary Care Provider:  Lezlie Lye, Meda Coffee, MD Primary Cardiologist:  Norman Herrlich, MD  Chief Complaint / Patient Profile   57 y.o. y/o male with a h/o CAD s/p DES-RCA in 2020, SVT, palpitations, hypertension, hyperlipidemia, and OSA who is pending L-2-3 laminectomy  who is pending right shoulder arthroscopy with possible rotator cuff repair and presents today for telephonic preoperative cardiovascular risk assessment.  History of Present Illness    Brandon [name] is a 57 y.o. male who presents via audio/video conferencing for a telehealth visit today.  Pt was last seen in cardiology clinic on 07/29/2021 by Dr. Dulce Sellar.  At that time Brandon [name] was doing well with palpitations but no other cardiac complaints.  He wore an event monitor that showed brief tachycardia that was asymptomatic with no evidence of malignant arrhythmia.  The patient is now pending procedure as outlined above. Since his last visit, he ***  *** denies chest pain, shortness of breath, lower extremity edema, fatigue, palpitations, melena, hematuria, hemoptysis, diaphoresis, weakness, presyncope, syncope,  orthopnea, and PND.    Per office protocol and pending no s/s of ACS at time of virtual visit, he may hold aspirin for 5-7 days prior to procedure and should resume as soon as hemodynamically stable postoperatively.    Past Medical History    Past Medical History:  Diagnosis Date   ACS (acute coronary syndrome) (HCC) 04/2018   Acute coronary syndrome (HCC) 04/23/2018   Acute deep vein thrombosis (DVT) of tibial vein of right lower extremity (HCC) 10/21/2019   Formatting of this note might be different from the original. 09/24/2019: RIGHT LOWER EXTREMITY VENOUS DOPPLER ULTRASOUND Evaluation of the infrapopliteal venous vasculature demonstrates acute thrombosis of 1 of the paired posterior tibial veins of the right lower extremity. This does not extend into the popliteal vein itself   Arthritis    both knees   Arthritis of knee, degenerative 05/14/2015   CAD in native artery 05/06/2018   Chest pain 06/23/2018   Chronic right shoulder pain 05/28/2015   Complication of anesthesia    " i HAVE A HARD TIME WAKING UP "   Coronary artery disease    COVID-19 02/03/2020   DVT (deep venous thrombosis) (HCC) 10/21/2019   Formatting of this note might be different from the original. 09/24/2019: RIGHT LOWER EXTREMITY VENOUS DOPPLER ULTRASOUND Evaluation of the infrapopliteal venous vasculature demonstrates acute thrombosis of 1 of the paired posterior tibial veins of the right lower extremity. This does not extend into the popliteal vein itself   Essential hypertension 05/14/2015   Facial twitching 09/10/2018   Gross hematuria 07/16/2019   Formatting of this note might be different from the original. 2021   Herpes zoster without complication  05/20/2019   History of non-ST elevation myocardial infarction (NSTEMI) 07/30/2018   Hypertension    Laryngopharyngeal reflux (LPR) 05/06/2019   Mixed hyperlipidemia 05/14/2015   Non-ST elevation (NSTEMI) myocardial infarction [facility])    Pre-diabetes 03/30/2016   2017: x/6.7  2018: 103/6.4   Right elbow pain 05/28/2015   SOB (shortness of breath) on exertion 05/16/2018   Type 2 diabetes mellitus without complication, without long-term current use of insulin (HCC) 03/30/2016   Formatting of this note might be different from the original. 2017: x/6.7 2018: 103/6.4 2021: 7.8   Past Surgical History:  Procedure Laterality Date   CORONARY STENT INTERVENTION N/A 04/23/2018   Procedure: CORONARY STENT INTERVENTION;  Surgeon: Tonny Bollman, MD;  Location: [facility] INVASIVE CV LAB;  Service: Cardiovascular;  Laterality: N/A;   EXCISION HAGLUND'S DEFORMITY WITH ACHILLES TENDON REPAIR Right 05/22/2014   Procedure: RIGHT ACHILLES DEBRIDEMENT; HAGLUND'S EXCISION ;  Surgeon: Toni Arthurs, MD;  Location: [facility];  Service: Orthopedics;  Laterality: Right;   GASTROCNEMIUS RECESSION Right 05/22/2014   Procedure: GASTROC RECESSION ;  Surgeon: Toni Arthurs, MD;  Location: [facility];  Service: Orthopedics;  Laterality: Right;   KNEE SURGERY     l/knee   LAMINECTOMY     LAPAROSCOPIC GASTRIC SLEEVE RESECTION     LEFT HEART CATH AND CORONARY ANGIOGRAPHY N/A 04/23/2018   Procedure: LEFT HEART CATH AND CORONARY ANGIOGRAPHY;  Surgeon: Tonny Bollman, MD;  Location: [facility] INVASIVE CV LAB;  Service: Cardiovascular;  Laterality: N/A;   MOUTH SURGERY     Root canal    Allergies  Allergies  Allergen Reactions   Brilinta [Ticagrelor] Shortness Of Breath    Home Medications    Prior to Admission medications   Medication Sig Start Date End Date Taking? Authorizing Provider  amLODipine (NORVASC) 2.5 MG tablet Take 2.5 mg by mouth daily. 07/19/21   [provider]  aspirin 81 MG chewable tablet Chew 1 tablet (81 mg total) by mouth daily. 04/24/18   Barrett, Joline Salt, PA-C  baclofen (LIORESAL) 10 MG tablet Take 10 mg by mouth 3 (three) times daily. 04/04/22   [provider]  ezetimibe (ZETIA) 10 MG tablet Take 1 tablet (10 mg total) by mouth daily.  11/30/21   Baldo Daub, MD  meloxicam (MOBIC) 15 MG tablet Take 15 mg by mouth daily as needed for pain. 07/19/21   [provider]  metFORMIN (GLUCOPHAGE) 500 MG tablet Take 500 mg by mouth 2 (two) times daily. 05/07/19   [provider]  metoprolol tartrate (LOPRESSOR) 25 MG tablet Take 1 tablet (25 mg total) by mouth 2 (two) times daily. 01/04/22   Baldo Daub, MD  MOUNJARO 5 MG/0.5ML Pen Inject 5 mg into the skin once a week. 04/11/22   [provider]  Multiple Vitamin (MULTIVITAMIN WITH MINERALS) TABS tablet Take 1 tablet by mouth daily.    [provider]  nitroGLYCERIN (NITROSTAT) 0.4 MG SL tablet Place 1 tablet (0.4 mg total) under the tongue every 5 (five) minutes as needed for chest pain. Patient not taking: Reported on 05/03/2022 04/24/18   Barrett, Joline Salt, PA-C  oxyCODONE-acetaminophen (PERCOCET) 5-325 MG tablet Take 1 tablet by mouth every 4 (four) hours as needed. Patient not taking: Reported on 05/03/2022 09/18/21   Garlon Hatchet, PA-C  OZEMPIC, 0.25 OR 0.5 MG/DOSE, 2 MG/3ML SOPN Inject 0.5 mg into the skin once a week. Patient not taking: Reported on 05/03/2022 07/13/21   [provider]  pregabalin (  LYRICA) 150 MG capsule Take 1 capsule by mouth 2 (two) times daily. 03/04/21   [provider]  rosuvastatin (CRESTOR) 40 MG tablet Take 40 mg by mouth daily. 08/19/19   [provider]  telmisartan-hydrochlorothiazide (MICARDIS HCT) 40-12.5 MG tablet TAKE 1 TABLET BY MOUTH EVERY DAY 11/30/21   Baldo Daub, MD  traMADol (ULTRAM) 50 MG tablet Take 50 mg by mouth every 8 (eight) hours as needed for moderate pain. Patient not taking: Reported on 05/03/2022 09/10/21   [provider]  Vitamin D, Ergocalciferol, (DRISDOL) 1.25 MG (50000 UNIT) CAPS capsule Take 50,000 Units by mouth once a week. Patient not taking: Reported on 05/03/2022 04/21/19   [provider]    Physical Exam    Vital Signs:  Brandon [name] does not have vital signs available for review today.***  Given telephonic nature of communication, physical exam is limited. AAOx3. NAD. Normal affect.  Speech and respirations are unlabored.  Accessory Clinical Findings    None  Assessment & Plan    1.  Preoperative Cardiovascular Risk Assessment:  Patient's RCRI score is 0.9%  The patient affirms he has been doing well without any new cardiac symptoms. They are able to achieve *** METS without cardiac limitations. Therefore, based on ACC/AHA guidelines, the patient would be at acceptable risk for the planned procedure without further cardiovascular testing. The patient was advised that if he develops new symptoms prior to surgery to contact our office to arrange for a follow-up visit, and he verbalized understanding.   The patient was advised that if he develops new symptoms prior to surgery to contact our office to arrange for a follow-up visit, and he verbalized understanding.  (Reminder: Include SBE prophylaxis/Antiplatelet/Anticoag Instructions***)  A copy of this note will be routed to requesting surgeon.  Time:   Today, I have spent *** minutes with the patient with telehealth technology discussing medical history, symptoms, and management plan.     Napoleon Form, Leodis Rains, NP  05/08/2022, 7:01 PM

## 2022-05-10 ENCOUNTER — Ambulatory Visit: Payer: BC Managed Care – PPO | Attending: Cardiovascular Disease

## 2022-05-10 DIAGNOSIS — Z01810 Encounter for preprocedural cardiovascular examination: Secondary | ICD-10-CM

## 2022-05-12 DIAGNOSIS — E785 Hyperlipidemia, unspecified: Secondary | ICD-10-CM | POA: Diagnosis not present

## 2022-05-12 DIAGNOSIS — R809 Proteinuria, unspecified: Secondary | ICD-10-CM | POA: Diagnosis not present

## 2022-05-12 DIAGNOSIS — Z6841 Body Mass Index (BMI) 40.0 and over, adult: Secondary | ICD-10-CM | POA: Diagnosis not present

## 2022-05-12 DIAGNOSIS — E1129 Type 2 diabetes mellitus with other diabetic kidney complication: Secondary | ICD-10-CM | POA: Diagnosis not present

## 2022-05-12 DIAGNOSIS — I10 Essential (primary) hypertension: Secondary | ICD-10-CM | POA: Diagnosis not present

## 2022-05-16 ENCOUNTER — Other Ambulatory Visit: Payer: Self-pay

## 2022-05-16 ENCOUNTER — Other Ambulatory Visit (HOSPITAL_COMMUNITY): Payer: Self-pay

## 2022-05-16 MED ORDER — TIRZEPATIDE 5 MG/0.5ML ~~LOC~~ SOAJ
5.0000 mg | SUBCUTANEOUS | 0 refills | Status: DC
  Filled 2022-05-16: qty 2, 28d supply, fill #0

## 2022-05-18 ENCOUNTER — Other Ambulatory Visit: Payer: Self-pay

## 2022-05-20 ENCOUNTER — Other Ambulatory Visit: Payer: Self-pay

## 2022-05-27 DIAGNOSIS — M48062 Spinal stenosis, lumbar region with neurogenic claudication: Secondary | ICD-10-CM | POA: Diagnosis not present

## 2022-05-27 DIAGNOSIS — G4733 Obstructive sleep apnea (adult) (pediatric): Secondary | ICD-10-CM | POA: Diagnosis not present

## 2022-05-30 ENCOUNTER — Other Ambulatory Visit: Payer: Self-pay | Admitting: Physician Assistant

## 2022-05-30 DIAGNOSIS — M48062 Spinal stenosis, lumbar region with neurogenic claudication: Secondary | ICD-10-CM

## 2022-05-30 DIAGNOSIS — M5416 Radiculopathy, lumbar region: Secondary | ICD-10-CM

## 2022-06-06 NOTE — Progress Notes (Unsigned)
Cardiology Office Note:    Date:  06/07/2022   ID:  Brandon [name], DOB 1965/10/31, MRN [id number]  PCP:  Lezlie Lye, Meda Coffee, MD  Cardiologist:  Norman Herrlich, MD    Referring MD: Lezlie Lye, Meda Coffee, *    ASSESSMENT:    1. Paroxysmal atrial fibrillation (HCC)   2. CAD in native artery   3. Essential hypertension   4. Mixed hyperlipidemia   5. SVT (supraventricular tachycardia)    PLAN:    In order of problems listed above:  The episode was relatively brief about 1 hour use Cardizem for rate controlled on anticoagulation pending rotator cuff surgery I do think he benefit by seeing EP for options of treatment antiarrhythmic drug versus ablation Stable CAD continue medical therapy aspirin statin beta-blocker Continue current treatment ARB  continue statin    Next appointment: 6 months   Medication Adjustments/Labs and Tests Ordered: Current medicines are reviewed at length with the patient today.  Concerns regarding medicines are outlined above.  No orders of the defined types were placed in this encounter.  No orders of the defined types were placed in this encounter.   Chief Complaint  Patient presents with   Follow-up   Coronary Artery Disease    History of Present Illness:    Brandon [name] is a 57 y.o. male with a hx of complex heart disease including CAD non-ST elevation MI PCI and stent right coronary artery and first marginal branch 04/23/2018 type 2 diabetes hypertension hyperlipidemia previous infrapopliteal deep vein thrombosis right lower extremity he has had bariatric surgery for morbid obesity and has had brief atrial tachycardia last seen 08/06/2021.  He was seen [place] ED 04/24/2022 for shortness of breath chest x-ray showed mildly coarsened interstitial markings that may reflect bronchitis evaluation showed a normal D-dimer troponin no arrhythmia on the monitor and was discharged from the hospital.  Compliance with diet, lifestyle and  medications: Yes  He has upcoming rotator cuff surgery scheduled for 07/24/2022 After seen by me shows me at mobile Kardia strips that show to clearly was in atrial fibrillation with a rapid response Called given prescription for Cardizem that he can take for rate control and will hold off on anticoagulant with upcoming rotator cuff surgery and I think he should be seen by EP ER visit was prompted by the rapid heart rate. Past Medical History:  Diagnosis Date   Abnormal thyroid blood test 03/30/2016   ACS (acute coronary syndrome) (HCC) 04/2018   Acute coronary syndrome (HCC) 04/23/2018   Acute deep vein thrombosis (DVT) of tibial vein of right lower extremity (HCC) 10/21/2019   Formatting of this note might be different from the original. 09/24/2019: RIGHT LOWER EXTREMITY VENOUS DOPPLER ULTRASOUND Evaluation of the infrapopliteal venous vasculature demonstrates acute thrombosis of 1 of the paired posterior tibial veins of the right lower extremity. This does not extend into the popliteal vein itself   Allergic rhinitis 05/03/2016   Arthritis    both knees   Arthritis of knee, degenerative 05/14/2015   CAD in native artery 05/06/2018   Chest pain 06/23/2018   Chronic right shoulder pain 05/28/2015   Complication of anesthesia    " i HAVE A HARD TIME WAKING UP "   Coronary artery disease    COVID-19 02/03/2020   DVT (deep venous thrombosis) (HCC) 10/21/2019   Formatting of this note might be different from the original. 09/24/2019: RIGHT LOWER EXTREMITY VENOUS DOPPLER ULTRASOUND Evaluation of the infrapopliteal venous vasculature demonstrates acute  thrombosis of 1 of the paired posterior tibial veins of the right lower extremity. This does not extend into the popliteal vein itself   Essential hypertension 05/14/2015   Facial twitching 09/10/2018   Gross hematuria 07/16/2019   Formatting of this note might be different from the original. 2021   Herpes zoster without complication 05/20/2019    History of non-ST elevation myocardial infarction (NSTEMI) 07/30/2018   Hypertension    Laryngopharyngeal reflux (LPR) 05/06/2019   Mixed hyperlipidemia 05/14/2015   Neurogenic claudication due to lumbar spinal stenosis 10/15/2021   Non-ST elevation (NSTEMI) myocardial infarction [facility])    Pre-diabetes 03/30/2016   2017: x/6.7 2018: 103/6.4   Right elbow pain 05/28/2015   SOB (shortness of breath) on exertion 05/16/2018   Type 2 diabetes mellitus without complication, without long-term current use of insulin (HCC) 03/30/2016   Formatting of this note might be different from the original. 2017: x/6.7 2018: 103/6.4 2021: 7.8   Wellness examination 04/05/2016    Past Surgical History:  Procedure Laterality Date   CORONARY STENT INTERVENTION N/A 04/23/2018   Procedure: CORONARY STENT INTERVENTION;  Surgeon: Tonny Bollman, MD;  Location: [facility] INVASIVE CV LAB;  Service: Cardiovascular;  Laterality: N/A;   EXCISION HAGLUND'S DEFORMITY WITH ACHILLES TENDON REPAIR Right 05/22/2014   Procedure: RIGHT ACHILLES DEBRIDEMENT; HAGLUND'S EXCISION ;  Surgeon: Toni Arthurs, MD;  Location: [facility];  Service: Orthopedics;  Laterality: Right;   GASTROCNEMIUS RECESSION Right 05/22/2014   Procedure: GASTROC RECESSION ;  Surgeon: Toni Arthurs, MD;  Location: [facility];  Service: Orthopedics;  Laterality: Right;   KNEE SURGERY     l/knee   LAMINECTOMY     LAPAROSCOPIC GASTRIC SLEEVE RESECTION     LEFT HEART CATH AND CORONARY ANGIOGRAPHY N/A 04/23/2018   Procedure: LEFT HEART CATH AND CORONARY ANGIOGRAPHY;  Surgeon: Tonny Bollman, MD;  Location: [facility] INVASIVE CV LAB;  Service: Cardiovascular;  Laterality: N/A;   MOUTH SURGERY     Root canal    Current Medications: Current Meds  Medication Sig   amLODipine (NORVASC) 2.5 MG tablet Take 2.5 mg by mouth daily.   aspirin 81 MG chewable tablet Chew 1 tablet (81 mg total) by mouth daily.   baclofen (LIORESAL) 10 MG tablet Take  10 mg by mouth 3 (three) times daily.   ezetimibe (ZETIA) 10 MG tablet Take 1 tablet (10 mg total) by mouth daily.   FARXIGA 10 MG TABS tablet Take 10 mg by mouth every morning.   meloxicam (MOBIC) 15 MG tablet Take 15 mg by mouth daily as needed for pain.   metFORMIN (GLUCOPHAGE) 500 MG tablet Take 500 mg by mouth 2 (two) times daily.   metoprolol tartrate (LOPRESSOR) 25 MG tablet Take 1 tablet (25 mg total) by mouth 2 (two) times daily.   MOUNJARO 5 MG/0.5ML Pen Inject 5 mg into the skin once a week.   Multiple Vitamin (MULTIVITAMIN WITH MINERALS) TABS tablet Take 1 tablet by mouth daily.   nitroGLYCERIN (NITROSTAT) 0.4 MG SL tablet Place 1 tablet (0.4 mg total) under the tongue every 5 (five) minutes as needed for chest pain.   pregabalin (LYRICA) 150 MG capsule Take 1 capsule by mouth 2 (two) times daily.   rosuvastatin (CRESTOR) 40 MG tablet Take 40 mg by mouth daily.   telmisartan (MICARDIS) 40 MG tablet Take 40 mg by mouth daily.   tirzepatide [facility]) 5 MG/0.5ML Pen Inject 5 mg into the skin once a week.   Vitamin D, Ergocalciferol, (DRISDOL)  1.25 MG (50000 UNIT) CAPS capsule Take 50,000 Units by mouth once a week.     Allergies:   Brilinta [ticagrelor]   Social History   Socioeconomic History   Marital status: Married    Spouse name: Not on file   Number of children: Not on file   Years of education: Not on file   Highest education level: Not on file  Occupational History   Not on file  Tobacco Use   Smoking status: Former    Packs/day: 2.00    Years: 10.00    Additional pack years: 0.00    Total pack years: 20.00    Types: Cigarettes   Smokeless tobacco: Never   Tobacco comments:    Quti smoking 20 years ago  Vaping Use   Vaping Use: Never used  Substance and Sexual Activity   Alcohol use: Not Currently    Comment: occ   Drug use: No   Sexual activity: Yes  Other Topics Concern   Not on file  Social History Narrative   Not on file   Social Determinants of  Health   Financial Resource Strain: Not on file  Food Insecurity: Not on file  Transportation Needs: Not on file  Physical Activity: Not on file  Stress: Not on file  Social Connections: Not on file     Family History: The patient's family history includes Cancer in his mother; Heart disease (age of onset: 35) in his father; Hypertension in his father. ROS:   Please see the history of present illness.    All other systems reviewed and are negative.  EKGs/Labs/Other Studies Reviewed:    The following studies were reviewed today:  Cardiac Studies & Procedures   CARDIAC CATHETERIZATION  CARDIAC CATHETERIZATION 04/24/2018  Narrative  Mid RCA lesion is 95% stenosed.  Ost 1st Mrg to 1st Mrg lesion is 90% stenosed.  Prox LAD lesion is 35% stenosed.  A drug-eluting stent was successfully placed using a STENT RESOLUTE ONYX 3.5X15.  Post intervention, there is a 0% residual stenosis.  A drug-eluting stent was successfully placed using a STENT RESOLUTE ONYX [id number].  Post intervention, there is a 0% residual stenosis.  1.  Severe stenosis of the mid RCA, treated successfully with PCI using a drug-eluting stent (3.5 x 15 mm resolute Onyx DES) 2.  Severe stenosis of the first OM branch of the circumflex, treated successfully with PCI using a drug-eluting stent (2.75 x 34 mm resolute Onyx DES) 3.  Widely patent left main 4.  Mild nonobstructive disease of the LAD 5.  Normal LV systolic function  Recommendations: Aggressive post MI medical therapy, if no early complications arise, consider discharge home tomorrow morning.  Findings Coronary Findings Diagnostic  Dominance: Right  Left Anterior Descending Prox LAD lesion is 35% stenosed.  Left Circumflex  First Obtuse Marginal Branch Ost 1st Mrg to 1st Mrg lesion is 90% stenosed.  Right Coronary Artery Mid RCA lesion is 95% stenosed.  Intervention  Ost 1st Mrg to 1st Mrg lesion Stent CATH LAUNCHER 37F EBU4.0 guide  catheter was inserted. Lesion crossed with guidewire using a WIRE COUGAR XT STRL 190CM. Pre-stent angioplasty was performed using a BALLOON SAPPHIRE 2.5X12. A drug-eluting stent was successfully placed using a STENT RESOLUTE ONYX [id number]. Post-stent angioplasty was performed using a BALLOON SAPPHIRE [place] 3.25X15. Maximum pressure:  16 atm. Post-Intervention Lesion Assessment The intervention was successful. Pre-interventional TIMI flow is 3. Post-intervention TIMI flow is 3. No complications occurred at this lesion. There is a 0%  residual stenosis post intervention.  Mid RCA lesion Stent CATH VISTA GUIDE 6FR AL1 guide catheter was inserted. Lesion crossed with guidewire using a WIRE COUGAR XT STRL 190CM. Pre-stent angioplasty was performed using a BALLOON SAPPHIRE 2.5X12. A drug-eluting stent was successfully placed using a STENT RESOLUTE ONYX 3.5X15. Maximum pressure: 16 atm. Post-Intervention Lesion Assessment The intervention was successful. Pre-interventional TIMI flow is 3. Post-intervention TIMI flow is 3. No complications occurred at this lesion. There is a 0% residual stenosis post intervention.   STRESS TESTS  MYOCARDIAL PERFUSION IMAGING 09/08/2020  Narrative   Clinically negative, electrically negative for ischemia.   No ST deviation was noted.   LV perfusion is normal. There is no evidence of ischemia.   Nuclear stress EF: 59 %. The left ventricular ejection fraction is normal (55-65%). Left ventricular function is normal. End diastolic cavity size is normal.     MONITORS  LONG TERM MONITOR (3-14 DAYS) 10/01/2021             Recent Labs: 09/17/2021: ALT 38 04/24/2022: BUN 18; Creatinine, Ser 0.84; Hemoglobin 16.3; Platelets 308; Potassium 4.4; Sodium 138  Recent Lipid Panel    Component Value Date/Time   CHOL 140 08/31/2020 0856   TRIG 164 (H) 08/31/2020 0856   HDL 44 08/31/2020 0856   CHOLHDL 3.2 08/31/2020 0856   CHOLHDL 4.0 04/23/2018 1547   VLDL 35 04/23/2018 1547    LDLCALC 68 08/31/2020 0856    Physical Exam:    VS:  BP 124/70   Pulse 68   Ht 6\' 1"  (1.854 m)   Wt (!) 361 lb 12.8 oz (164.1 kg)   SpO2 93%   BMI 47.73 kg/m     Wt Readings from Last 3 Encounters:  06/07/22 (!) 361 lb 12.8 oz (164.1 kg)  04/24/22 (!) 360 lb (163.3 kg)  12/05/21 (!) 350 lb (158.8 kg)     GEN:  Well nourished, well developed in no acute distress HEENT: Normal NECK: No JVD; No carotid bruits LYMPHATICS: No lymphadenopathy CARDIAC: RRR, no murmurs, rubs, gallops RESPIRATORY:  Clear to auscultation without rales, wheezing or rhonchi  ABDOMEN: Soft, non-tender, non-distended MUSCULOSKELETAL:  No edema; No deformity  SKIN: Warm and dry NEUROLOGIC:  Alert and oriented x 3 PSYCHIATRIC:  Normal affect    Signed, Norman Herrlich, MD  06/07/2022 11:52 AM    [facility]

## 2022-06-07 ENCOUNTER — Ambulatory Visit: Payer: BC Managed Care – PPO | Attending: Cardiology | Admitting: Cardiology

## 2022-06-07 ENCOUNTER — Encounter: Payer: Self-pay | Admitting: Cardiology

## 2022-06-07 VITALS — BP 124/70 | HR 68 | Ht 73.0 in | Wt 361.8 lb

## 2022-06-07 DIAGNOSIS — G4733 Obstructive sleep apnea (adult) (pediatric): Secondary | ICD-10-CM | POA: Insufficient documentation

## 2022-06-07 DIAGNOSIS — E782 Mixed hyperlipidemia: Secondary | ICD-10-CM

## 2022-06-07 DIAGNOSIS — S335XXA Sprain of ligaments of lumbar spine, initial encounter: Secondary | ICD-10-CM | POA: Insufficient documentation

## 2022-06-07 DIAGNOSIS — I480 Paroxysmal atrial fibrillation: Secondary | ICD-10-CM | POA: Diagnosis not present

## 2022-06-07 DIAGNOSIS — I2510 Atherosclerotic heart disease of native coronary artery without angina pectoris: Secondary | ICD-10-CM | POA: Diagnosis not present

## 2022-06-07 DIAGNOSIS — I10 Essential (primary) hypertension: Secondary | ICD-10-CM | POA: Diagnosis not present

## 2022-06-07 DIAGNOSIS — E669 Obesity, unspecified: Secondary | ICD-10-CM | POA: Insufficient documentation

## 2022-06-07 DIAGNOSIS — I4891 Unspecified atrial fibrillation: Secondary | ICD-10-CM | POA: Insufficient documentation

## 2022-06-07 DIAGNOSIS — M5430 Sciatica, unspecified side: Secondary | ICD-10-CM | POA: Insufficient documentation

## 2022-06-07 DIAGNOSIS — I471 Supraventricular tachycardia, unspecified: Secondary | ICD-10-CM

## 2022-06-07 MED ORDER — DILTIAZEM HCL 30 MG PO TABS: 30.0000 mg | ORAL_TABLET | Freq: Four times a day (QID) | ORAL | 1 refills | Status: DC | PRN

## 2022-06-07 NOTE — Patient Instructions (Addendum)
Medication Instructions:  Your physician has recommended you make the following change in your medication:   START: Diltiazem 30 mg every 6 hours as needed for heart rate greater than 120 bpm  *If you need a refill on your cardiac medications before your next appointment, please call your pharmacy*   Lab Work: None If you have labs (blood work) drawn today and your tests are completely normal, you will receive your results only by: MyChart Message (if you have MyChart) OR A paper copy in the mail If you have any lab test that is abnormal or we need to change your treatment, we will call you to review the results.   Testing/Procedures: None   Follow-Up: At [facility], you and your health needs are our priority.  As part of our continuing mission to provide you with exceptional heart care, we have created designated Provider Care Teams.  These Care Teams include your primary Cardiologist (physician) and Advanced Practice Providers (APPs -  Physician Assistants and Nurse Practitioners) who all work together to provide you with the care you need, when you need it.  We recommend signing up for the patient portal called "MyChart".  Sign up information is provided on this After Visit Summary.  MyChart is used to connect with patients for Virtual Visits (Telemedicine).  Patients are able to view lab/test results, encounter notes, upcoming appointments, etc.  Non-urgent messages can be sent to your provider as well.   To learn more about what you can do with MyChart, go to [email or web address].    Your next appointment:   9 month(s)  Provider:   Norman Herrlich, MD    Other Instructions None

## 2022-06-15 ENCOUNTER — Encounter: Payer: Self-pay | Admitting: Physician Assistant

## 2022-06-16 ENCOUNTER — Ambulatory Visit
Admission: RE | Admit: 2022-06-16 | Discharge: 2022-06-16 | Disposition: A | Discharge status: Home / Self Care | Payer: BC Managed Care – PPO | Source: Ambulatory Visit | Attending: Physician Assistant | Admitting: Physician Assistant

## 2022-06-16 DIAGNOSIS — M5416 Radiculopathy, lumbar region: Secondary | ICD-10-CM

## 2022-06-16 DIAGNOSIS — M48062 Spinal stenosis, lumbar region with neurogenic claudication: Secondary | ICD-10-CM

## 2022-06-16 DIAGNOSIS — M48061 Spinal stenosis, lumbar region without neurogenic claudication: Secondary | ICD-10-CM | POA: Diagnosis not present

## 2022-06-16 DIAGNOSIS — M5430 Sciatica, unspecified side: Secondary | ICD-10-CM | POA: Diagnosis not present

## 2022-06-16 MED ORDER — GADOPICLENOL 0.5 MMOL/ML IV SOLN
10.0000 mL | Freq: Once | INTRAVENOUS | Status: AC | PRN
  Administered 2022-06-16: 10 mL via INTRAVENOUS

## 2022-06-21 DIAGNOSIS — S46011A Strain of muscle(s) and tendon(s) of the rotator cuff of right shoulder, initial encounter: Secondary | ICD-10-CM | POA: Diagnosis not present

## 2022-06-21 DIAGNOSIS — M25511 Pain in right shoulder: Secondary | ICD-10-CM | POA: Diagnosis not present

## 2022-06-24 DIAGNOSIS — Z7985 Long-term (current) use of injectable non-insulin antidiabetic drugs: Secondary | ICD-10-CM | POA: Diagnosis not present

## 2022-06-24 DIAGNOSIS — R2689 Other abnormalities of gait and mobility: Secondary | ICD-10-CM | POA: Diagnosis not present

## 2022-06-24 DIAGNOSIS — I2510 Atherosclerotic heart disease of native coronary artery without angina pectoris: Secondary | ICD-10-CM | POA: Diagnosis not present

## 2022-06-24 DIAGNOSIS — Z7984 Long term (current) use of oral hypoglycemic drugs: Secondary | ICD-10-CM | POA: Diagnosis not present

## 2022-06-24 DIAGNOSIS — I10 Essential (primary) hypertension: Secondary | ICD-10-CM | POA: Diagnosis not present

## 2022-06-24 DIAGNOSIS — E119 Type 2 diabetes mellitus without complications: Secondary | ICD-10-CM | POA: Diagnosis not present

## 2022-06-24 DIAGNOSIS — G8918 Other acute postprocedural pain: Secondary | ICD-10-CM | POA: Diagnosis not present

## 2022-06-24 DIAGNOSIS — E668 Other obesity: Secondary | ICD-10-CM | POA: Diagnosis not present

## 2022-06-24 DIAGNOSIS — I252 Old myocardial infarction: Secondary | ICD-10-CM | POA: Diagnosis not present

## 2022-06-24 DIAGNOSIS — S46001A Unspecified injury of muscle(s) and tendon(s) of the rotator cuff of right shoulder, initial encounter: Secondary | ICD-10-CM | POA: Diagnosis not present

## 2022-06-24 DIAGNOSIS — M7541 Impingement syndrome of right shoulder: Secondary | ICD-10-CM | POA: Diagnosis not present

## 2022-06-24 DIAGNOSIS — Z87891 Personal history of nicotine dependence: Secondary | ICD-10-CM | POA: Diagnosis not present

## 2022-06-24 DIAGNOSIS — Z86718 Personal history of other venous thrombosis and embolism: Secondary | ICD-10-CM | POA: Diagnosis not present

## 2022-06-24 DIAGNOSIS — Z791 Long term (current) use of non-steroidal anti-inflammatories (NSAID): Secondary | ICD-10-CM | POA: Diagnosis not present

## 2022-06-24 DIAGNOSIS — M25811 Other specified joint disorders, right shoulder: Secondary | ICD-10-CM | POA: Diagnosis not present

## 2022-06-24 DIAGNOSIS — M7551 Bursitis of right shoulder: Secondary | ICD-10-CM | POA: Diagnosis not present

## 2022-06-24 DIAGNOSIS — S46011A Strain of muscle(s) and tendon(s) of the rotator cuff of right shoulder, initial encounter: Secondary | ICD-10-CM | POA: Diagnosis not present

## 2022-06-24 DIAGNOSIS — Z6841 Body Mass Index (BMI) 40.0 and over, adult: Secondary | ICD-10-CM | POA: Diagnosis not present

## 2022-06-27 DIAGNOSIS — M25611 Stiffness of right shoulder, not elsewhere classified: Secondary | ICD-10-CM | POA: Diagnosis not present

## 2022-06-27 DIAGNOSIS — M25511 Pain in right shoulder: Secondary | ICD-10-CM | POA: Diagnosis not present

## 2022-06-27 DIAGNOSIS — G4733 Obstructive sleep apnea (adult) (pediatric): Secondary | ICD-10-CM | POA: Diagnosis not present

## 2022-06-28 ENCOUNTER — Other Ambulatory Visit: Payer: Self-pay

## 2022-06-30 DIAGNOSIS — M48062 Spinal stenosis, lumbar region with neurogenic claudication: Secondary | ICD-10-CM | POA: Diagnosis not present

## 2022-07-04 DIAGNOSIS — M25611 Stiffness of right shoulder, not elsewhere classified: Secondary | ICD-10-CM | POA: Diagnosis not present

## 2022-07-04 DIAGNOSIS — M25511 Pain in right shoulder: Secondary | ICD-10-CM | POA: Diagnosis not present

## 2022-07-06 ENCOUNTER — Other Ambulatory Visit: Payer: Self-pay | Admitting: Cardiology

## 2022-07-06 DIAGNOSIS — M25511 Pain in right shoulder: Secondary | ICD-10-CM | POA: Diagnosis not present

## 2022-07-06 DIAGNOSIS — M25611 Stiffness of right shoulder, not elsewhere classified: Secondary | ICD-10-CM | POA: Diagnosis not present

## 2022-07-12 DIAGNOSIS — M25611 Stiffness of right shoulder, not elsewhere classified: Secondary | ICD-10-CM | POA: Diagnosis not present

## 2022-07-12 DIAGNOSIS — M25511 Pain in right shoulder: Secondary | ICD-10-CM | POA: Diagnosis not present

## 2022-07-15 DIAGNOSIS — M25611 Stiffness of right shoulder, not elsewhere classified: Secondary | ICD-10-CM | POA: Diagnosis not present

## 2022-07-15 DIAGNOSIS — M25511 Pain in right shoulder: Secondary | ICD-10-CM | POA: Diagnosis not present

## 2022-07-19 DIAGNOSIS — M25611 Stiffness of right shoulder, not elsewhere classified: Secondary | ICD-10-CM | POA: Diagnosis not present

## 2022-07-19 DIAGNOSIS — M25511 Pain in right shoulder: Secondary | ICD-10-CM | POA: Diagnosis not present

## 2022-07-20 ENCOUNTER — Other Ambulatory Visit: Payer: Self-pay | Admitting: Cardiology

## 2022-07-22 DIAGNOSIS — M25511 Pain in right shoulder: Secondary | ICD-10-CM | POA: Diagnosis not present

## 2022-07-22 DIAGNOSIS — M25611 Stiffness of right shoulder, not elsewhere classified: Secondary | ICD-10-CM | POA: Diagnosis not present

## 2022-07-27 ENCOUNTER — Other Ambulatory Visit: Payer: Self-pay | Admitting: Rehabilitation

## 2022-07-27 DIAGNOSIS — M5416 Radiculopathy, lumbar region: Secondary | ICD-10-CM

## 2022-07-27 DIAGNOSIS — G4733 Obstructive sleep apnea (adult) (pediatric): Secondary | ICD-10-CM | POA: Diagnosis not present

## 2022-07-28 ENCOUNTER — Encounter: Payer: Self-pay | Admitting: Rehabilitation

## 2022-07-28 DIAGNOSIS — M5416 Radiculopathy, lumbar region: Secondary | ICD-10-CM | POA: Diagnosis not present

## 2022-07-30 ENCOUNTER — Ambulatory Visit
Admission: RE | Admit: 2022-07-30 | Discharge: 2022-07-30 | Disposition: A | Discharge status: Home / Self Care | Payer: BC Managed Care – PPO | Source: Ambulatory Visit | Attending: Rehabilitation | Admitting: Rehabilitation

## 2022-07-30 DIAGNOSIS — R200 Anesthesia of skin: Secondary | ICD-10-CM | POA: Diagnosis not present

## 2022-07-30 DIAGNOSIS — M545 Low back pain, unspecified: Secondary | ICD-10-CM | POA: Diagnosis not present

## 2022-07-30 DIAGNOSIS — Z79899 Other long term (current) drug therapy: Secondary | ICD-10-CM | POA: Diagnosis not present

## 2022-07-30 DIAGNOSIS — M47816 Spondylosis without myelopathy or radiculopathy, lumbar region: Secondary | ICD-10-CM | POA: Diagnosis not present

## 2022-07-30 DIAGNOSIS — R202 Paresthesia of skin: Secondary | ICD-10-CM | POA: Diagnosis not present

## 2022-07-30 DIAGNOSIS — M5416 Radiculopathy, lumbar region: Secondary | ICD-10-CM

## 2022-07-30 DIAGNOSIS — M5136 Other intervertebral disc degeneration, lumbar region: Secondary | ICD-10-CM | POA: Diagnosis not present

## 2022-07-30 DIAGNOSIS — M48061 Spinal stenosis, lumbar region without neurogenic claudication: Secondary | ICD-10-CM | POA: Diagnosis not present

## 2022-07-30 DIAGNOSIS — M5126 Other intervertebral disc displacement, lumbar region: Secondary | ICD-10-CM | POA: Diagnosis not present

## 2022-07-30 DIAGNOSIS — M5442 Lumbago with sciatica, left side: Secondary | ICD-10-CM | POA: Diagnosis not present

## 2022-08-01 ENCOUNTER — Other Ambulatory Visit: Payer: Self-pay

## 2022-08-01 ENCOUNTER — Institutional Professional Consult (permissible substitution): Payer: BC Managed Care – PPO | Admitting: Cardiology

## 2022-08-03 DIAGNOSIS — Z79891 Long term (current) use of opiate analgesic: Secondary | ICD-10-CM | POA: Diagnosis not present

## 2022-08-03 DIAGNOSIS — G894 Chronic pain syndrome: Secondary | ICD-10-CM | POA: Diagnosis not present

## 2022-08-03 DIAGNOSIS — M5416 Radiculopathy, lumbar region: Secondary | ICD-10-CM | POA: Diagnosis not present

## 2022-08-03 DIAGNOSIS — Z79899 Other long term (current) drug therapy: Secondary | ICD-10-CM | POA: Diagnosis not present

## 2022-08-08 DIAGNOSIS — M25511 Pain in right shoulder: Secondary | ICD-10-CM | POA: Diagnosis not present

## 2022-08-18 DIAGNOSIS — E785 Hyperlipidemia, unspecified: Secondary | ICD-10-CM | POA: Diagnosis not present

## 2022-08-18 DIAGNOSIS — E1129 Type 2 diabetes mellitus with other diabetic kidney complication: Secondary | ICD-10-CM | POA: Diagnosis not present

## 2022-08-23 DIAGNOSIS — Z6841 Body Mass Index (BMI) 40.0 and over, adult: Secondary | ICD-10-CM | POA: Diagnosis not present

## 2022-08-23 DIAGNOSIS — E785 Hyperlipidemia, unspecified: Secondary | ICD-10-CM | POA: Diagnosis not present

## 2022-08-23 DIAGNOSIS — I10 Essential (primary) hypertension: Secondary | ICD-10-CM | POA: Diagnosis not present

## 2022-08-23 DIAGNOSIS — E1129 Type 2 diabetes mellitus with other diabetic kidney complication: Secondary | ICD-10-CM | POA: Diagnosis not present

## 2022-08-23 DIAGNOSIS — R809 Proteinuria, unspecified: Secondary | ICD-10-CM | POA: Diagnosis not present

## 2022-08-27 ENCOUNTER — Other Ambulatory Visit: Payer: Self-pay | Admitting: Cardiology

## 2022-08-27 DIAGNOSIS — G4733 Obstructive sleep apnea (adult) (pediatric): Secondary | ICD-10-CM | POA: Diagnosis not present

## 2022-09-02 DIAGNOSIS — M5416 Radiculopathy, lumbar region: Secondary | ICD-10-CM | POA: Diagnosis not present

## 2022-09-05 DIAGNOSIS — M25611 Stiffness of right shoulder, not elsewhere classified: Secondary | ICD-10-CM | POA: Diagnosis not present

## 2022-09-05 DIAGNOSIS — M25511 Pain in right shoulder: Secondary | ICD-10-CM | POA: Diagnosis not present

## 2022-09-13 ENCOUNTER — Ambulatory Visit: Payer: BC Managed Care – PPO | Attending: Cardiology | Admitting: Cardiology

## 2022-09-15 ENCOUNTER — Encounter: Payer: Self-pay | Admitting: Cardiology

## 2022-09-27 DIAGNOSIS — G4733 Obstructive sleep apnea (adult) (pediatric): Secondary | ICD-10-CM | POA: Diagnosis not present

## 2022-10-27 DIAGNOSIS — G4733 Obstructive sleep apnea (adult) (pediatric): Secondary | ICD-10-CM | POA: Diagnosis not present

## 2022-11-03 DIAGNOSIS — H6693 Otitis media, unspecified, bilateral: Secondary | ICD-10-CM | POA: Diagnosis not present

## 2022-11-03 DIAGNOSIS — J0100 Acute maxillary sinusitis, unspecified: Secondary | ICD-10-CM | POA: Diagnosis not present

## 2022-11-16 DIAGNOSIS — E785 Hyperlipidemia, unspecified: Secondary | ICD-10-CM | POA: Diagnosis not present

## 2022-11-16 DIAGNOSIS — E1129 Type 2 diabetes mellitus with other diabetic kidney complication: Secondary | ICD-10-CM | POA: Diagnosis not present

## 2022-11-23 DIAGNOSIS — R809 Proteinuria, unspecified: Secondary | ICD-10-CM | POA: Diagnosis not present

## 2022-11-23 DIAGNOSIS — E1129 Type 2 diabetes mellitus with other diabetic kidney complication: Secondary | ICD-10-CM | POA: Diagnosis not present

## 2022-11-23 DIAGNOSIS — Z6838 Body mass index (BMI) 38.0-38.9, adult: Secondary | ICD-10-CM | POA: Diagnosis not present

## 2022-11-23 DIAGNOSIS — E785 Hyperlipidemia, unspecified: Secondary | ICD-10-CM | POA: Diagnosis not present

## 2022-11-23 DIAGNOSIS — I10 Essential (primary) hypertension: Secondary | ICD-10-CM | POA: Diagnosis not present

## 2022-11-27 DIAGNOSIS — G4733 Obstructive sleep apnea (adult) (pediatric): Secondary | ICD-10-CM | POA: Diagnosis not present

## 2022-12-02 DIAGNOSIS — Z1211 Encounter for screening for malignant neoplasm of colon: Secondary | ICD-10-CM | POA: Diagnosis not present

## 2022-12-02 DIAGNOSIS — Z1212 Encounter for screening for malignant neoplasm of rectum: Secondary | ICD-10-CM | POA: Diagnosis not present

## 2022-12-10 LAB — EXTERNAL GENERIC LAB PROCEDURE: COLOGUARD: NEGATIVE

## 2022-12-10 LAB — COLOGUARD: COLOGUARD: NEGATIVE

## 2022-12-15 DIAGNOSIS — Z23 Encounter for immunization: Secondary | ICD-10-CM | POA: Diagnosis not present

## 2022-12-15 DIAGNOSIS — Z0000 Encounter for general adult medical examination without abnormal findings: Secondary | ICD-10-CM | POA: Diagnosis not present

## 2022-12-15 DIAGNOSIS — L300 Nummular dermatitis: Secondary | ICD-10-CM | POA: Diagnosis not present

## 2022-12-15 DIAGNOSIS — Z6837 Body mass index (BMI) 37.0-37.9, adult: Secondary | ICD-10-CM | POA: Diagnosis not present

## 2023-01-01 ENCOUNTER — Other Ambulatory Visit: Payer: Self-pay | Admitting: Cardiology

## 2023-01-27 DIAGNOSIS — G4733 Obstructive sleep apnea (adult) (pediatric): Secondary | ICD-10-CM | POA: Diagnosis not present

## 2023-02-27 DIAGNOSIS — G4733 Obstructive sleep apnea (adult) (pediatric): Secondary | ICD-10-CM | POA: Diagnosis not present

## 2023-03-03 DIAGNOSIS — L820 Inflamed seborrheic keratosis: Secondary | ICD-10-CM | POA: Diagnosis not present

## 2023-03-03 DIAGNOSIS — L308 Other specified dermatitis: Secondary | ICD-10-CM | POA: Diagnosis not present

## 2023-03-03 DIAGNOSIS — L821 Other seborrheic keratosis: Secondary | ICD-10-CM | POA: Diagnosis not present

## 2023-03-03 DIAGNOSIS — L920 Granuloma annulare: Secondary | ICD-10-CM | POA: Diagnosis not present

## 2023-03-14 ENCOUNTER — Ambulatory Visit: Payer: BC Managed Care – PPO | Attending: Cardiology | Admitting: Cardiology

## 2023-03-14 ENCOUNTER — Encounter: Payer: Self-pay | Admitting: Cardiology

## 2023-03-14 VITALS — BP 110/80 | HR 60 | Ht 73.0 in | Wt 277.0 lb

## 2023-03-14 DIAGNOSIS — I480 Paroxysmal atrial fibrillation: Secondary | ICD-10-CM | POA: Diagnosis not present

## 2023-03-14 DIAGNOSIS — E782 Mixed hyperlipidemia: Secondary | ICD-10-CM

## 2023-03-14 DIAGNOSIS — I10 Essential (primary) hypertension: Secondary | ICD-10-CM | POA: Diagnosis not present

## 2023-03-14 DIAGNOSIS — E119 Type 2 diabetes mellitus without complications: Secondary | ICD-10-CM

## 2023-03-14 DIAGNOSIS — I2510 Atherosclerotic heart disease of native coronary artery without angina pectoris: Secondary | ICD-10-CM

## 2023-03-14 DIAGNOSIS — Z6836 Body mass index (BMI) 36.0-36.9, adult: Secondary | ICD-10-CM

## 2023-03-14 NOTE — Patient Instructions (Addendum)
 Medication Instructions:  Your physician recommends that you continue on your current medications as directed. Please refer to the Current Medication list given to you today.  *If you need a refill on your cardiac medications before your next appointment, please call your pharmacy*  Other Instructions Blood pressure log x 2 weeks then return to Wallis Bamberg, NP   Lab Work: None Ordered If you have labs (blood work) drawn today and your tests are completely normal, you will receive your results only by: MyChart Message (if you have MyChart) OR A paper copy in the mail If you have any lab test that is abnormal or we need to change your treatment, we will call you to review the results.   Testing/Procedures: None Ordered   Follow-Up: At [facility], you and your health needs are our priority.  As part of our continuing mission to provide you with exceptional heart care, we have created designated Provider Care Teams.  These Care Teams include your primary Cardiologist (physician) and Advanced Practice Providers (APPs -  Physician Assistants and Nurse Practitioners) who all work together to provide you with the care you need, when you need it.  We recommend signing up for the patient portal called "MyChart".  Sign up information is provided on this After Visit Summary.  MyChart is used to connect with patients for Virtual Visits (Telemedicine).  Patients are able to view lab/test results, encounter notes, upcoming appointments, etc.  Non-urgent messages can be sent to your provider as well.   To learn more about what you can do with MyChart, go to [email or web address].    Your next appointment:   6 month(s)  The format for your next appointment:   In Person  Provider:   Wallis Bamberg, NP

## 2023-03-14 NOTE — Progress Notes (Signed)
 Cardiology Office Note:  .   Date:  03/14/2023  ID:  Brandon [name], DOB 26-May-1965, MRN [id number] PCP: Brandon [name], Brandon Coffee, MD  [facility] Providers Cardiologist:  Brandon Herrlich, MD    History of Present Illness: .   Brandon [name] is a 58 y.o. male with a past medical history of CAD s/p DES to mid RCA and first OM branch 2020, atrial fibrillation, DVT, HTN, OSA, DM2, dyslipidemia.  07/19/2021 monitor Average heart rate 76 bpm, predominant rhythm was sinus, 1 run of VT occurred lasting 4 beats, 8 episodes of SVT 09/07/2020 Myoview negative for ischemia, EF 59% 10/17/2019 ABI normal 04/23/2018 left heart cath PCI and DES to the mid RCA, and first OM branch  Most recently evaluated by Dr. Dulce [name] on 06/07/2022, he had had an recent episode of atrial fibrillation and was given a prescription for Cardizem, but doing well at the time of this visit.  He was stable from a cardiac perspective advised to follow-up in 9 months.  He presents today for follow-up of his CAD and atrial fibrillation.  He has been doing well from a cardiac perspective since he was last evaluated in our office.  He has been working on weight loss, is down almost 100 pounds since last year.  He has not had recurrent episodes of atrial fibrillation, he checks his EKG on his smart watch.  His blood pressure is low normal although he does not have any symptoms from this.  He is very encouraged to continue to lose weight to hopefully get off more of his medications.  He is working out daily by walking.  He denies chest pain, palpitations, dyspnea, pnd, orthopnea, n, v, dizziness, syncope, edema, weight gain, or early satiety.   ROS: Review of Systems  Musculoskeletal:  Positive for back pain.  All other systems reviewed and are negative.    Studies Reviewed: .        Cardiac Studies & Procedures   ______________________________________________________________________________________________ CARDIAC  CATHETERIZATION  CARDIAC CATHETERIZATION 04/23/2018  Narrative  Mid RCA lesion is 95% stenosed.  Ost 1st Mrg to 1st Mrg lesion is 90% stenosed.  Prox LAD lesion is 35% stenosed.  A drug-eluting stent was successfully placed using a STENT RESOLUTE ONYX 3.5X15.  Post intervention, there is a 0% residual stenosis.  A drug-eluting stent was successfully placed using a STENT RESOLUTE ONYX [id number].  Post intervention, there is a 0% residual stenosis.  1.  Severe stenosis of the mid RCA, treated successfully with PCI using a drug-eluting stent (3.5 x 15 mm resolute Onyx DES) 2.  Severe stenosis of the first OM branch of the circumflex, treated successfully with PCI using a drug-eluting stent (2.75 x 34 mm resolute Onyx DES) 3.  Widely patent left main 4.  Mild nonobstructive disease of the LAD 5.  Normal LV systolic function  Recommendations: Aggressive post MI medical therapy, if no early complications arise, consider discharge home tomorrow morning.  Findings Coronary Findings Diagnostic  Dominance: Right  Left Anterior Descending Prox LAD lesion is 35% stenosed.  Left Circumflex  First Obtuse Marginal Branch Ost 1st Mrg to 1st Mrg lesion is 90% stenosed.  Right Coronary Artery Mid RCA lesion is 95% stenosed.  Intervention  Ost 1st Mrg to 1st Mrg lesion Stent CATH LAUNCHER 84F EBU4.0 guide catheter was inserted. Lesion crossed with guidewire using a WIRE COUGAR XT STRL 190CM. Pre-stent angioplasty was performed using a BALLOON SAPPHIRE 2.5X12. A drug-eluting stent was successfully placed using a STENT RESOLUTE  ONYX [id number]. Post-stent angioplasty was performed using a BALLOON SAPPHIRE Carmine 3.25X15. Maximum pressure:  16 atm. Post-Intervention Lesion Assessment The intervention was successful. Pre-interventional TIMI flow is 3. Post-intervention TIMI flow is 3. No complications occurred at this lesion. There is a 0% residual stenosis post intervention.  Mid RCA  lesion Stent CATH VISTA GUIDE 6FR AL1 guide catheter was inserted. Lesion crossed with guidewire using a WIRE COUGAR XT STRL 190CM. Pre-stent angioplasty was performed using a BALLOON SAPPHIRE 2.5X12. A drug-eluting stent was successfully placed using a STENT RESOLUTE ONYX 3.5X15. Maximum pressure: 16 atm. Post-Intervention Lesion Assessment The intervention was successful. Pre-interventional TIMI flow is 3. Post-intervention TIMI flow is 3. No complications occurred at this lesion. There is a 0% residual stenosis post intervention.   STRESS TESTS  MYOCARDIAL PERFUSION IMAGING 09/08/2020  Narrative   Clinically negative, electrically negative for ischemia.   No ST deviation was noted.   LV perfusion is normal. There is no evidence of ischemia.   Nuclear stress EF: 59 %. The left ventricular ejection fraction is normal (55-65%). Left ventricular function is normal. End diastolic cavity size is normal.      MONITORS  LONG TERM MONITOR (3-14 DAYS) 07/19/2021       ______________________________________________________________________________________________      Risk Assessment/Calculations:    CHA2DS2-VASc Score = 3   This indicates a 3.2% annual risk of stroke. The patient's score is based upon: CHF History: 0 HTN History: 1 Diabetes History: 1 Stroke History: 0 Vascular Disease History: 1 Age Score: 0 Gender Score: 0            Physical Exam:   VS:  BP 110/80 (BP Location: Left Arm, Patient Position: Sitting, Cuff Size: Normal)   Pulse 60   Ht 6\' 1"  (1.854 m)   Wt 277 lb (125.6 kg)   SpO2 95%   BMI 36.55 kg/m    Wt Readings from Last 3 Encounters:  03/14/23 277 lb (125.6 kg)  06/07/22 (!) 361 lb 12.8 oz (164.1 kg)  04/24/22 (!) 360 lb (163.3 kg)    GEN: Well nourished, well developed in no acute distress NECK: No JVD; No carotid bruits CARDIAC: RRR, no murmurs, rubs, gallops RESPIRATORY:  Clear to auscultation without rales, wheezing or rhonchi  ABDOMEN:  Soft, non-tender, non-distended EXTREMITIES:  No edema; No deformity   ASSESSMENT AND PLAN: .   PAF -no recurrent episodes of atrial fibrillation, his CHA2DS2-VASc score is 3 if he has recurrent episodes of atrial fibrillation in the future we need to start him on DOAC.  He has a as needed dose for Cardizem if his heart rate is sustained greater than 120 bpm however he has not needed this.  Continue metoprolol 25 mg twice daily.  CAD -s/p DES to mid RCA and OM branch in 2020.  Continue aspirin 81 mg daily, continue Zetia 10 mg daily, continue nitroglycerin as needed, continue Crestor 20 mg daily, continue nitroglycerin as needed--has not needed.Stable with no anginal symptoms. No indication for ischemic evaluation.  Heart healthy diet and regular cardiovascular exercise encouraged.     Hypertension-blood pressure is low normal at 110/80 however he is asymptomatic.  He is currently on amlodipine 2.5 mg daily, Micardis 40 mg daily, metoprolol 25 mg twice daily.  I am going to have him keep a blood pressure log for 2 weeks, and if his blood pressure remains on the low normal side and urine to decrease his Micardis.  His eventual goal is to get off several of  his medications so hopefully we will be able to do this.  Dyslipidemia-is formally monitored by his PCP and his most recent LDL was very well-controlled at 39 on 11/16/2022.  Currently on Crestor 20 mg daily.  Obesity-BMI is currently 47, he has lost approximately 100 pounds over the last year, and his health is reflecting positive changes.  DM2-most recent A1c is well-controlled at 5.7%, currently on Farxiga 10 mg daily, Glucophage 500 mg twice daily.       Dispo: Blood pressure log for 2 weeks, if his blood pressure is very well-controlled we will plan to decrease his Micardis with the eventual goal of stopping this altogether.  Follow-up in 6 months.  Signed, Flossie Dibble, NP

## 2023-03-23 DIAGNOSIS — E1129 Type 2 diabetes mellitus with other diabetic kidney complication: Secondary | ICD-10-CM | POA: Diagnosis not present

## 2023-03-23 DIAGNOSIS — E785 Hyperlipidemia, unspecified: Secondary | ICD-10-CM | POA: Diagnosis not present

## 2023-03-29 ENCOUNTER — Other Ambulatory Visit: Payer: Self-pay | Admitting: Cardiology

## 2023-03-30 DIAGNOSIS — R809 Proteinuria, unspecified: Secondary | ICD-10-CM | POA: Diagnosis not present

## 2023-03-30 DIAGNOSIS — E1129 Type 2 diabetes mellitus with other diabetic kidney complication: Secondary | ICD-10-CM | POA: Diagnosis not present

## 2023-03-30 DIAGNOSIS — E785 Hyperlipidemia, unspecified: Secondary | ICD-10-CM | POA: Diagnosis not present

## 2023-03-30 DIAGNOSIS — Z6835 Body mass index (BMI) 35.0-35.9, adult: Secondary | ICD-10-CM | POA: Diagnosis not present

## 2023-03-30 DIAGNOSIS — I10 Essential (primary) hypertension: Secondary | ICD-10-CM | POA: Diagnosis not present

## 2023-03-30 DIAGNOSIS — Z1331 Encounter for screening for depression: Secondary | ICD-10-CM | POA: Diagnosis not present

## 2023-04-21 ENCOUNTER — Telehealth: Payer: Self-pay | Admitting: Cardiology

## 2023-04-21 NOTE — Telephone Encounter (Signed)
 Called and spoke to patient and reviewed Brandon [name] note. Reviewed that she recommends discontinue amlodipine and monitoring his blood pressure. Patient verbalized understanding and had no additional questions.

## 2023-06-25 ENCOUNTER — Other Ambulatory Visit: Payer: Self-pay | Admitting: Cardiology

## 2023-06-28 ENCOUNTER — Other Ambulatory Visit: Payer: Self-pay | Admitting: Cardiology

## 2023-07-05 ENCOUNTER — Telehealth: Payer: Self-pay | Admitting: Cardiology

## 2023-07-05 NOTE — Telephone Encounter (Signed)
 Pt c/o medication issue:  1. Name of Medication: metoprolol  tartrate (LOPRESSOR ) 25 MG tablet   2. How are you currently taking this medication (dosage and times per day)? -  3. Are you having a reaction (difficulty breathing--STAT)? No  4. What is your medication issue? Pharmacy calling wanting clarification-is pt supposed to be on succinate or tartrate. Requesting cb and ref mk#[id number]

## 2023-07-05 NOTE — Telephone Encounter (Signed)
 Left detailed message for [facility] pharmacy with RX information of Metoprolol 

## 2023-08-03 DIAGNOSIS — E785 Hyperlipidemia, unspecified: Secondary | ICD-10-CM | POA: Diagnosis not present

## 2023-08-03 DIAGNOSIS — E1129 Type 2 diabetes mellitus with other diabetic kidney complication: Secondary | ICD-10-CM | POA: Diagnosis not present

## 2023-08-15 DIAGNOSIS — E1129 Type 2 diabetes mellitus with other diabetic kidney complication: Secondary | ICD-10-CM | POA: Diagnosis not present

## 2023-08-15 DIAGNOSIS — E785 Hyperlipidemia, unspecified: Secondary | ICD-10-CM | POA: Diagnosis not present

## 2023-08-15 DIAGNOSIS — R809 Proteinuria, unspecified: Secondary | ICD-10-CM | POA: Diagnosis not present

## 2023-08-15 DIAGNOSIS — I10 Essential (primary) hypertension: Secondary | ICD-10-CM | POA: Diagnosis not present

## 2023-08-15 DIAGNOSIS — Z6833 Body mass index (BMI) 33.0-33.9, adult: Secondary | ICD-10-CM | POA: Diagnosis not present

## 2023-09-15 DIAGNOSIS — M1612 Unilateral primary osteoarthritis, left hip: Secondary | ICD-10-CM | POA: Diagnosis not present

## 2023-09-15 DIAGNOSIS — M25552 Pain in left hip: Secondary | ICD-10-CM | POA: Diagnosis not present
